# Patient Record
Sex: Male | Born: 1940 | Race: White | Hispanic: No | Marital: Married | Smoking: Former smoker
Health system: Southern US, Community
[De-identification: ages and names within clinical notes are randomized; demographics above are authoritative.]

## PROBLEM LIST (undated history)

## (undated) DIAGNOSIS — F419 Anxiety disorder, unspecified: Secondary | ICD-10-CM

## (undated) DIAGNOSIS — I4891 Unspecified atrial fibrillation: Secondary | ICD-10-CM

## (undated) DIAGNOSIS — F32A Depression, unspecified: Secondary | ICD-10-CM

## (undated) DIAGNOSIS — R32 Unspecified urinary incontinence: Secondary | ICD-10-CM

## (undated) DIAGNOSIS — K5900 Constipation, unspecified: Secondary | ICD-10-CM

## (undated) DIAGNOSIS — K219 Gastro-esophageal reflux disease without esophagitis: Secondary | ICD-10-CM

## (undated) DIAGNOSIS — G8929 Other chronic pain: Secondary | ICD-10-CM

## (undated) DIAGNOSIS — M549 Dorsalgia, unspecified: Secondary | ICD-10-CM

## (undated) DIAGNOSIS — I2510 Atherosclerotic heart disease of native coronary artery without angina pectoris: Secondary | ICD-10-CM

## (undated) DIAGNOSIS — I10 Essential (primary) hypertension: Secondary | ICD-10-CM

## (undated) DIAGNOSIS — J189 Pneumonia, unspecified organism: Secondary | ICD-10-CM

## (undated) DIAGNOSIS — I639 Cerebral infarction, unspecified: Secondary | ICD-10-CM

## (undated) DIAGNOSIS — F329 Major depressive disorder, single episode, unspecified: Secondary | ICD-10-CM

## (undated) DIAGNOSIS — C801 Malignant (primary) neoplasm, unspecified: Secondary | ICD-10-CM

## (undated) DIAGNOSIS — E785 Hyperlipidemia, unspecified: Secondary | ICD-10-CM

---

## 1998-12-05 ENCOUNTER — Emergency Department (HOSPITAL_COMMUNITY): Admission: EM | Admit: 1998-12-05 | Discharge: 1998-12-05 | Payer: Self-pay | Admitting: Emergency Medicine

## 2000-01-07 ENCOUNTER — Encounter: Payer: Self-pay | Admitting: Emergency Medicine

## 2000-01-07 ENCOUNTER — Inpatient Hospital Stay (HOSPITAL_COMMUNITY): Admission: EM | Admit: 2000-01-07 | Discharge: 2000-01-08 | Payer: Self-pay | Admitting: Emergency Medicine

## 2000-01-08 ENCOUNTER — Encounter: Payer: Self-pay | Admitting: Cardiology

## 2002-07-20 ENCOUNTER — Encounter: Admission: RE | Admit: 2002-07-20 | Discharge: 2002-10-18 | Payer: Self-pay | Admitting: Internal Medicine

## 2004-12-19 ENCOUNTER — Ambulatory Visit (HOSPITAL_COMMUNITY): Admission: RE | Admit: 2004-12-19 | Discharge: 2004-12-19 | Payer: Self-pay | Admitting: Urology

## 2005-06-27 ENCOUNTER — Ambulatory Visit: Payer: Self-pay | Admitting: Internal Medicine

## 2005-07-02 ENCOUNTER — Ambulatory Visit: Payer: Self-pay | Admitting: Internal Medicine

## 2005-07-03 ENCOUNTER — Encounter: Payer: Self-pay | Admitting: Internal Medicine

## 2005-07-03 ENCOUNTER — Ambulatory Visit: Payer: Self-pay

## 2005-07-06 ENCOUNTER — Ambulatory Visit: Payer: Self-pay | Admitting: Internal Medicine

## 2005-07-11 ENCOUNTER — Ambulatory Visit: Payer: Self-pay | Admitting: Internal Medicine

## 2005-07-18 ENCOUNTER — Ambulatory Visit: Payer: Self-pay | Admitting: Internal Medicine

## 2005-08-15 ENCOUNTER — Ambulatory Visit: Payer: Self-pay | Admitting: Internal Medicine

## 2005-09-28 ENCOUNTER — Ambulatory Visit: Payer: Self-pay | Admitting: Internal Medicine

## 2005-10-01 ENCOUNTER — Ambulatory Visit: Payer: Self-pay | Admitting: Internal Medicine

## 2005-10-02 ENCOUNTER — Encounter: Payer: Self-pay | Admitting: Internal Medicine

## 2006-10-10 ENCOUNTER — Encounter: Admission: RE | Admit: 2006-10-10 | Discharge: 2006-10-10 | Payer: Self-pay | Admitting: Internal Medicine

## 2006-11-20 ENCOUNTER — Ambulatory Visit: Payer: Self-pay | Admitting: Cardiology

## 2006-11-25 DIAGNOSIS — E785 Hyperlipidemia, unspecified: Secondary | ICD-10-CM

## 2006-11-25 DIAGNOSIS — K219 Gastro-esophageal reflux disease without esophagitis: Secondary | ICD-10-CM

## 2006-11-25 DIAGNOSIS — Z87442 Personal history of urinary calculi: Secondary | ICD-10-CM

## 2006-11-25 DIAGNOSIS — I4891 Unspecified atrial fibrillation: Secondary | ICD-10-CM

## 2006-11-25 DIAGNOSIS — I10 Essential (primary) hypertension: Secondary | ICD-10-CM | POA: Insufficient documentation

## 2006-11-25 HISTORY — DX: Gastro-esophageal reflux disease without esophagitis: K21.9

## 2006-11-25 HISTORY — DX: Hyperlipidemia, unspecified: E78.5

## 2006-11-26 ENCOUNTER — Ambulatory Visit: Payer: Self-pay

## 2006-11-26 LAB — CONVERTED CEMR LAB
ALT: 13 units/L (ref 0–53)
Bilirubin, Direct: 0.1 mg/dL (ref 0.0–0.3)
CRP, High Sensitivity: 1 (ref 0.00–5.00)
Calcium: 8.5 mg/dL (ref 8.4–10.5)
Direct LDL: 137.4 mg/dL
GFR calc Af Amer: 109 mL/min
GFR calc non Af Amer: 90 mL/min
Glucose, Bld: 108 mg/dL — ABNORMAL HIGH (ref 70–99)
HDL: 35 mg/dL — ABNORMAL LOW (ref >39.0)
Potassium: 3.8 meq/L (ref 3.5–5.1)
TSH: 1.04 microintl units/mL (ref 0.35–5.50)
Total CHOL/HDL Ratio: 6.3
Triglycerides: 159 mg/dL — ABNORMAL HIGH (ref 0–149)

## 2006-12-04 ENCOUNTER — Ambulatory Visit: Payer: Self-pay | Admitting: Cardiology

## 2006-12-19 ENCOUNTER — Ambulatory Visit: Payer: Self-pay | Admitting: Internal Medicine

## 2006-12-23 ENCOUNTER — Ambulatory Visit: Payer: Self-pay | Admitting: Cardiology

## 2006-12-26 ENCOUNTER — Ambulatory Visit: Payer: Self-pay | Admitting: Cardiology

## 2007-01-08 ENCOUNTER — Ambulatory Visit: Payer: Self-pay | Admitting: Cardiology

## 2007-02-03 ENCOUNTER — Ambulatory Visit: Payer: Self-pay | Admitting: Cardiology

## 2007-02-03 LAB — CONVERTED CEMR LAB
ALT: 14 units/L (ref 0–53)
AST: 15 units/L (ref 0–37)
Alkaline Phosphatase: 41 units/L (ref 39–117)
BUN: 14 mg/dL (ref 6–23)
Bilirubin, Direct: 0.1 mg/dL (ref 0.0–0.3)
CO2: 30 meq/L (ref 19–32)
Calcium: 8.9 mg/dL (ref 8.4–10.5)
Chloride: 109 meq/L (ref 96–112)
Cholesterol: 146 mg/dL (ref 0–200)
Creatinine, Ser: 1.2 mg/dL (ref 0.4–1.5)
Glucose, Bld: 107 mg/dL — ABNORMAL HIGH (ref 70–99)
Total Bilirubin: 0.8 mg/dL (ref 0.3–1.2)
Total CHOL/HDL Ratio: 3.7
Total Protein: 6.6 g/dL (ref 6.0–8.3)

## 2007-02-06 ENCOUNTER — Ambulatory Visit: Payer: Self-pay | Admitting: Internal Medicine

## 2007-02-06 ENCOUNTER — Inpatient Hospital Stay (HOSPITAL_COMMUNITY): Admission: EM | Admit: 2007-02-06 | Discharge: 2007-02-19 | Payer: Self-pay | Admitting: Emergency Medicine

## 2007-02-06 ENCOUNTER — Ambulatory Visit: Payer: Self-pay | Admitting: Cardiology

## 2007-02-06 ENCOUNTER — Ambulatory Visit: Payer: Self-pay | Admitting: Pulmonary Disease

## 2007-02-06 LAB — CONVERTED CEMR LAB
Basophils Absolute: 0 10*3/uL (ref 0.0–0.1)
Calcium: 9.2 mg/dL (ref 8.4–10.5)
Chloride: 103 meq/L (ref 96–112)
Eosinophils Absolute: 0.1 10*3/uL (ref 0.0–0.6)
Eosinophils Relative: 1.7 % (ref 0.0–5.0)
GFR calc Af Amer: 86 mL/min
GFR calc non Af Amer: 71 mL/min
Glucose, Bld: 79 mg/dL (ref 70–99)
Lymphocytes Relative: 32.9 % (ref 12.0–46.0)
MCHC: 35.8 g/dL (ref 30.0–36.0)
MCV: 91.7 fL (ref 78.0–100.0)
Magnesium: 2.2 mg/dL (ref 1.5–2.5)
Neutro Abs: 4 10*3/uL (ref 1.4–7.7)
Platelets: 236 10*3/uL (ref 150–400)
RBC: 4.17 M/uL — ABNORMAL LOW (ref 4.22–5.81)
Sodium: 140 meq/L (ref 135–145)

## 2007-02-10 ENCOUNTER — Encounter (INDEPENDENT_AMBULATORY_CARE_PROVIDER_SITE_OTHER): Payer: Self-pay | Admitting: Neurology

## 2007-02-11 ENCOUNTER — Encounter (INDEPENDENT_AMBULATORY_CARE_PROVIDER_SITE_OTHER): Payer: Self-pay | Admitting: Neurology

## 2007-02-14 ENCOUNTER — Ambulatory Visit: Payer: Self-pay | Admitting: Physical Medicine & Rehabilitation

## 2007-02-19 ENCOUNTER — Inpatient Hospital Stay (HOSPITAL_COMMUNITY)
Admission: RE | Admit: 2007-02-19 | Discharge: 2007-03-25 | Payer: Self-pay | Admitting: Physical Medicine & Rehabilitation

## 2007-04-04 ENCOUNTER — Ambulatory Visit: Payer: Self-pay | Admitting: Vascular Surgery

## 2007-04-04 ENCOUNTER — Ambulatory Visit: Admission: RE | Admit: 2007-04-04 | Discharge: 2007-04-04 | Payer: Self-pay | Admitting: Internal Medicine

## 2007-04-04 ENCOUNTER — Encounter (INDEPENDENT_AMBULATORY_CARE_PROVIDER_SITE_OTHER): Payer: Self-pay | Admitting: Internal Medicine

## 2007-12-18 ENCOUNTER — Ambulatory Visit: Payer: Self-pay | Admitting: Cardiology

## 2008-07-12 IMAGING — CR DG ABD PORTABLE 1V
1 series · 1 of 1 positions shown · non-contrast
Comparison: None.

CLINICAL DATA: 66 year old; cerebellar hemorrhage.  Panda tube placement.
 PORTABLE ABDOMEN - 1 VIEW - 02/14/07:

[view not recorded]
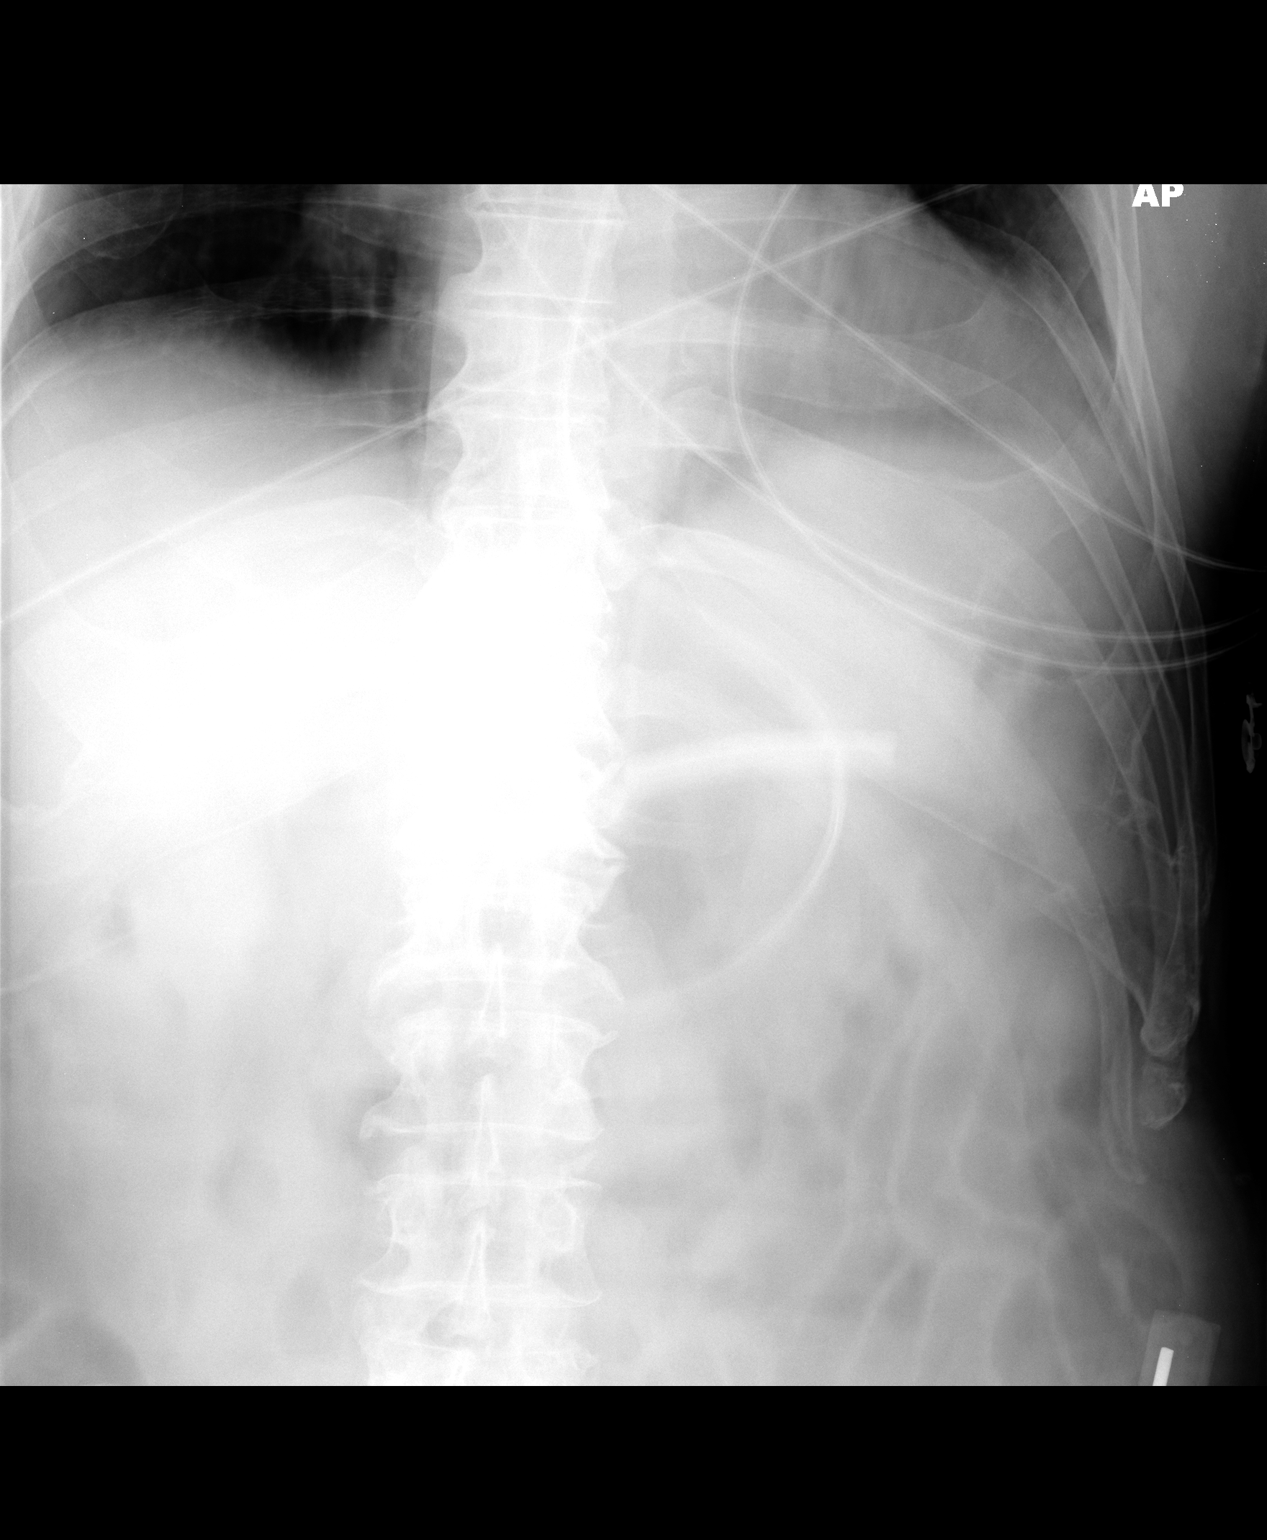

[1 of 1 positions shown; findings below may reference images not displayed]

FINDINGS: The Panda tube is coiled back on itself with the tip in the body region of the stomach.
IMPRESSION: Panda tube tip coiled back with its tip in the body region of the stomach.

## 2010-06-18 ENCOUNTER — Encounter: Payer: Self-pay | Admitting: Physical Medicine & Rehabilitation

## 2010-06-18 ENCOUNTER — Encounter: Payer: Self-pay | Admitting: Internal Medicine

## 2010-06-19 ENCOUNTER — Encounter: Payer: Self-pay | Admitting: Neurology

## 2010-10-10 NOTE — Discharge Summary (Signed)
NAME:  Gary Reynolds, Gary Reynolds                 ACCOUNT NO.:  [id number]   MEDICAL RECORD NO.:  [id number]          PATIENT TYPE:  INP   LOCATION:  3031                         FACILITY:  [facility]   PHYSICIAN:  Pramod P. Pearlean Brownie, MD    DATE OF BIRTH:  [birth year]   DATE OF ADMISSION:  02/06/2007  DATE OF DISCHARGE:  02/19/2007                               DISCHARGE SUMMARY   DIAGNOSES AT TIME OF DISCHARGE.:  1. Right thalamic intracranial hemorrhage, secondary to Coumadin;      status post intraventricular catheter placement.  2. Aphasia.  3. Hyperlipidemia.  4. Hypertension.  5. Atrial fibrillation, for which she was on chronic Coumadin prior to      admission.  6. Obesity.  7. Sedentary lifestyle.  8. Questionable situational depression.   MEDICINES AT TIME OF DISCHARGE:  1. Sliding-scale insulin q.4 h.  2. Protonix 40 mg a day.  3. Avapro 150 mg a day, which is a substitution for Diovan 80.  4. Hydrochlorothiazide 12.5 mg a day.  5. Lovenox 40 mg subcutaneously daily.  6. Zocor 40 mg a day.  7. Colace 100 mg b.i.d.  8. Jevity 75 mL an hour via Panda.  9. Free water 90 mL q.4 h.   STUDIES PERFORMED:  1. CT of the brain on admission showed acute right thalamic bleed;      roughly 3 cubic cm.  2. CT of the brain at 24 hours shows stable intracranial hemorrhage.  3. CT of the brain at 36 hours shows right thalamic bleed as stable,      with volume increase to 12 mL.  4. CT of the brain at 48 hours shows interval placement of new      ventriculostomy chest drainage, with the hemorrhage 4.1 x 3.5.  5. Follow-up CT on February 11, 2007 shows stable right      intraparenchymal hemorrhage, with mild bilateral intraventricular      hemorrhage.  6. CT of the brain on February 13, 2007 shows gas in the left frontal      subdural space following ventriculostomy removal; no hydrocephalus.      Stable right thalamic and basal ganglia intraparenchymal hemorrhage      and intraventricular  hemorrhage.  7. Chest X-Ray: With increasing basilar opacities, which remained      stable then improved throughout hospitalization.  Last chest x-ray      performed February 14, 2007 with a slight increase in pulmonary      edema.  8. Abdominal x-ray verifying Panda tube placement.  9. Carotid Doppler shows no ICA stenosis.  Vertebral flow antegrade      bilaterally.  10.EKG shows atrial fibrillation with old septal infarct.   LABORATORY STUDIES:  CBC with hemoglobin 12.1, hematocrit 34.8 white  blood cells 10.7.  Chemistry:  BUN 35, creatinine 0.88, glucose 115.  Blood cultures negative x2 days.  INR on admission 2.4 and down to 1.3  on February 09, 2007.  Hemoglobin A1c 5.6.  Homocystine 11.0.  Cardiac  enzymes negative.  Cholesterol 101, triglycerides 84, HDL 34, LDL 50.  Respiratory  culture with abundant diphtheroids.   HISTORY OF PRESENT ILLNESS:  [name] is a 70 year old Caucasian male  with known atherosclerotic cardiovascular disease and atrial  fibrillation on Coumadin; who had multiple nonhemorrhagic infarcts  bilaterally.  The patient was to undergo cardioversion February 07, 2007.  On September 11 he had coitus with his wife after taking Levitra.  Shortly thereafter as he was dressing he developed weakness on his left  side that proceeded to flaccid strength, left facial droop and slurred  speech.  He was brought to [facility] and as he arrived it was clear  that he was improving.  His strength improved to the point of mild left-  sided weakness in the range of 4/5, and his NIH stroke scale at 8:50  p.m. was 4.   CT of the brain showed a 23 x 19 mm lesion in the right thalamus without  mass effect.  The patient suddenly then became diaphoretic, had some  problems with breathing and coughing, and developed increasing weakness  on the left side.  He was brought to the CT scan, and the lesion had  markedly increased in the range of an irregularly contoured 43 by 43  x  50 mm lesion -- again, involving the thalamus with hemorrhage into the  right lateral ventricle.  The quadrigeminal cistern was opened.   The increase in hemorrhage happened despite the fact the patient  received 5 mg of vitamin K and 2 units of fresh frozen plasma.  The  plasma was going at the time he worsened.  His blood pressure was never  greater than 140 systolic and his dialysis was as high as 101; but 91 at  the time of the rebleed.  He was admitted to the ICU for monitoring.  He  was placed on a Cardene drip and was intubated in order to protect his  airway.   HOSPITAL COURSE:  The patient remained intubated on Cardene drip in the  ICU.  Dr. Hilda Lias, neurosurgeon, was consulted for  ventriculostomy placement.  Poor neurologic prognosis was discussed with  the patient's wife and family.  As they had a family member who had a  remarkable recovery following a brain injury, they were interested in  aggressive care.  Critical Care Medicine managed the ventilator during  his ICU stay.  Tube feedings were started.  Cardiology was consulted for  atrial fibrillation.  Aggressive control of blood pressure and  temperature were maintained.  It was felt the source of his hemorrhage  was the fact that he was taking Coumadin for atrial fibrillation.   On September 16 the patient was extubated.  On September 19 the patient  was transferred to the floor; there PT, OT and rehab were consulted and  the patient was felt to be a good rehabilitation candidate.  Though,  neurologic prognosis still remains poor with expected ongoing impairment  with severe neglect.  Modified barium swallow has been attempted  multiple times, and was then able to be performed either secondary to  the patient's sedation or scheduling difficulties.  On September 24 the  patient was stable for discharge to rehabilitation, and was transferred  there for continuation of therapies.  Modified barium swallow is  still  pending.  There is a great potential that the patient will likely need a  PEG placement.  This will be addressed on rehabilitation.  In the  meanwhile, the patient is off antiplatelets secondary to hemorrhage.   CONDITION ON DISCHARGE:  The patient alert and oriented x3.  He has no  aphasia or dysarthria.  He does follow commands.  He has a significant  neglect syndrome with a right gaze preference.  He still has left facial  weakness.  He has dense left hemiparesis.  He has good strength and good  movement on the right.   DISCHARGE/PLAN:  1. Transfer to rehabilitation for continuation of PT, OT and speech      therapy.  2. Modified barium swallow on day of discharge.  Consider PEG as      needed.  3. Off antiplatelets, secondary to hemorrhage.  4. Needs aggressive ongoing risk factor control after discharge from      rehabilitation by primary care physician.  5. Follow up with Dr. Janalyn Shy P. Sethi in 2-3 months.      Annie Main, N.P.    ______________________________  Sunny Schlein. Pearlean Brownie, MD    SB/MEDQ  D:  02/19/2007  T:  02/20/2007  Job:  [id number]   cc:   Thora Lance, M.D.  Thomas C. Daleen Squibb, MD, [facility]  Hilda Lias, M.D.

## 2010-10-10 NOTE — Consult Note (Signed)
Gary Reynolds, Gary Reynolds                 ACCOUNT NO.:  [id number]   MEDICAL RECORD NO.:  [id number]          PATIENT TYPE:  INP   LOCATION:  2308                         FACILITY:  [facility]   PHYSICIAN:  Hilda Lias, M.D.   DATE OF BIRTH:  1940/11/06   DATE OF CONSULTATION:  DATE OF DISCHARGE:                                 CONSULTATION   [name] is a gentleman who was admitted to the hospital yesterday  because of sudden onset of weakness in a patient who had been taking  Coumadin.  Repeated CT scan today showed increase of the right thalamic  hematoma with shift.  The INR this morning was 1.4.  Clinically, the  patient is intubated, he is sedated.  CT scan shows indeed he has right  thalamic hematoma, with shift from right to left.  We were called by the  stroke center, Dr. Pearlean Brownie, for the possibility of interventricular  catheter.  After I looked at the patient, I talked to the wife, and she  and her son fully agreed with the interventricular catheter.  The risks  were explained to both of them including infections, CSF leak, increased  hematoma along the track.           ______________________________  Hilda Lias, M.D.     EB/MEDQ  D:  02/07/2007  T:  02/07/2007  Job:  [id number]

## 2010-10-10 NOTE — Assessment & Plan Note (Signed)
[facility]                            CARDIOLOGY OFFICE NOTE   RODDY, BELLAMY                          MRN:          [id number]  DATE:12/23/2006                            DOB:          13-Sep-1940    Gary Reynolds comes in today to discuss the management of the following issues:  1. Atrial fibrillation.  2. Hypertension.  He brings numerous readings in today which shows      blood pressure to be fairly labile.  Most of them, however, in the      130/80 range.  3. Anticoagulation.  He is having this checked on a weekly basis.  His      last one was therapeutic.  Once he is therapeutic for 3-4 weeks, we      are talking about outpatient cardioversion.  4. Hyperlipidemia, on Crestor 10 mg a day.  This may becoming a      financial issue for him.  We renewed his prescription today with      followup blood work in about 3-4 weeks.   He is under a lot of personal stress.  We discussed these issues at  length today.   His blood pressure today is 135/76, pulse is 80 and irregular.  His  weight is 231.  On his scales, he is actually 220 some, usually runs  about 225, but today he was about 221.  The rest of his exam is  unchanged.  His pulse is irregular.   ASSESSMENT AND PLAN:  I spent about 30 minutes talking to Gary Reynolds today  about all of his medical issues.  Gary Reynolds has also spent a  substantial amount of time with him as well.   PLAN:  1. Renew Crestor.  2. Followup blood work in about 3-4 weeks.  3. Once he is therapeutic with his Coumadin for 3-4 weeks, we will      arrange outpatient cardioversion.     Thomas C. Daleen Squibb, MD, [facility]  Electronically Signed    TCW/MedQ  DD: 12/23/2006  DT: 12/24/2006  Job #: [id number]

## 2010-10-10 NOTE — Discharge Summary (Signed)
Gary Reynolds, Gary Reynolds                 ACCOUNT NO.:  [id number]   MEDICAL RECORD NO.:  [id number]          PATIENT TYPE:  IPS   LOCATION:  4025                         FACILITY:  [facility]   PHYSICIAN:  Ranelle Oyster, M.D.DATE OF BIRTH:  [birth year]   DATE OF ADMISSION:  02/19/2007  DATE OF DISCHARGE:  03/25/2007                               DISCHARGE SUMMARY   DISCHARGE DIAGNOSES:  1. Right thalamic and basal ganglia bleed with left hemiparesis, left      neglect and cognitive deficits.  2. Hypertension.  3. Urinary tract infection, treated.  4. Sacral decubitus.  5. Atrial fibrillation.  6. Anorexia.   HISTORY OF PRESENT ILLNESS:  Gary Reynolds is a 70 year old male with  history of coronary artery disease, chronic atrial fibrillation, with  Coumadin, admitted September 11 with left-sided weakness, left facial  droop and slurred speech.  CT of head showed acute right thalamic bleed.  The patient was treated with fresh frozen plasma and vitamin K, but  noted to have increase in size of bleed, requiring ventriculostomy  placement on September 12 by Dr. Jeral Fruit.  The patient was sedated and  intubated to protect airway and extubated without difficulty on  September 16.  Followup CCT on September 18 shows stable right thalamic  and right basal ganglia hemorrhage with mild right-to-left shift  unchanged.  Cardiac echo done showed EF of 55%, mild to moderate  increase in left ventricular wall.  Carotid Doppler showed no ICA  stenosis.  The patient was noted to have issues with fevers, blood  cultures and respiratory cultures negative.  The patient was initially  covered with IV antibiotics.  Currently, the patient remains n.p.o. with  MBS pending.  He was noted to continue with left gaze preference with  left hemiparesis and 3 deficits on the left, lethargy improving, noted  be hyper-verbose.  Therapies were initiated and the patient was noted to  have impairments in balance, able to sit at  the edge of the bed with min  to max assist, tendency to fall to left.  Renal insufficiency was noted  with BUN and 35, creatinine at 0.88.  Rehab was consulted for further  therapies.   PAST MEDICAL HISTORY:  Significant for:  1. Hypertension.  2. Dyslipidemia.  3. PAF.  4. Colon polyps.  5. Pterygium , left eye.   ALLERGIES:  SULFA , BEE STINGS and SHELLFISH.   FAMILY HISTORY:  Positive for ALS.   SOCIAL HISTORY:  The patient is married, lives in a 1-level home with 4  steps at entry and was independent, but sedentary prior to admission.  Tobacco:  He was smoking 2 packs per day till a few months ago.  He  drinks 2-4 ounces of alcohol per day.   HOSPITAL COURSE:  Gary Reynolds was admitted to Rehab on February 19, 2007 for inpatient therapies to consist of PT, OT and speech therapy.  The patient had MBS done on day of admission on September 24 and was  initiated on D-I diet and nectar-thick liquids.  Initially, patient was  on h.s.  tube feeds; this was discontinued on September 26 an IV fluids  were started for hydration from 7 p.m. to 7 a.m. secondary to nectar-  thick liquids.  He was started also on Megace 400 mg b.i.d. to help with  appetite stimulation.  The patient's blood pressures have been monitored  on a b.i.d. basis and initially were noted to be elevated; Avapro was  increased.  However, the patient was noted to have a downward trend on  blood pressure; therefore, BP medications have slowly been discontinued.  The patient was noted to have spasticity on left and trial of baclofen  was done, but patient with some lethargy on this; therefore, this was  discontinued.  Patient with acute renal insufficiency at admission.  This has slowly been improving.  Repeat swallow studies were done during  this stay and at last study of October 17, advanced the patient to D-II  diet with thin liquids.  He continues to require full supervision,  crushed medications in puree and  small bites and sips.  The patient  continues with Foley secondary to neurogenic bladder problems with  urinary retention.  He was noted to develop an eversion on the penile  meatus that has been treated with bacitracin ointment b.i.d. as well as  lidocaine q.i.d. secondary to pain issues.  Attempt was made to secure  Foley tubing with the leg bag; however, this placed extra traction on  the penile meatus; therefore, this was discontinued.  Recommendations  are to continue close monitoring of this site.  Wound bed currently is  noted to have clean red granulation tissue.  Continue routine care of  meatus as well as laxity on the Foley tubing to prevent further erosion.   The patient was noted to develop a sacral decubitus at coccyx as well as  on left buttock.  EPC cream was used initially.  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  Recommendations are also  to use air mattress overlay as well as turn patient q.2 h. when in bed.  Also note that the patient is unable to self boost; he will require  boosting when in wheelchair and recommend using nice thick gel cushion  to prevent further breakdown of sacral and buttock areas.  Currently,  patient with some continuous issues with hypotension while standing.  Abdominal binder is used to help support blood pressure.  Most recent  check of labs from October 27 shows renal status to be stable with  sodium 135, potassium 4.1, chloride 105, CO2 23, BUN 19, creatinine 1.5,  glucose 104.  Check of CBC reveals hemoglobin 12.4, hematocrit 35, white  count 13.2, platelet 311,000.  Of note, the patient was noted to have  fevers on October 15 with urine culture growing out greater than 100,000  colonies of Pseudomonas.  Initially, he was started on Keflex and as  Pseudomonas was resistant to Keflex, he was changed over to Levaquin for  6 total days of antibiotic  treatment.  Repeat urine culture from October  22 shows no growth.  Last prealbumin level is at 26.2.  Last weight of  October 22 is at 94 kg.  Recommendations are to continue encouraging the  patient's p.o., assist patient at meals as needed to help with p.o.  intake.  Continue nutritional supplements, Ensure with fiber t.i.d. to  help maintain nutritional status.   The patient  was started with a trial of Reglan; however, he has had  decrease in verbal output; family are concerned regarding medication  side-effects.  Reglan was discontinued and antidepressant was initiated  for a few days; however, this was deceased also to decrease any effects  from medication side effects.  The patient was started on Provigil 200  mg per day on October 24 to help with attention as well as arousal.  Therapies continue to be ongoing with the patient being at +2 total  assist, 20% for bed mobility, standing activity attempted and Theraplus  with reaching with right upper extremity supported in sitting and  standing.  Patient was noted to be distracted secondary to left lower  extremity flexure withdrawal due to spasticity.  He continues to have  problems with posture with tendency to push to the left in sitting.  BP  in standing last with the PT shows it is at 141/92 with the patient  working on weight shifting to the right in extending bilateral knees  with max assist.  OT has been working with standing activities  Theraplus.  He is noted to have some and increase level of anxiety in  standing and weight shifting.  OT is also working on weightbearing to  left upper extremity and in reaching activities.  Speech has been  working with the patient on aspiration precautions.  Currently, the  patient is able to mirror at modified independent level for clearing  left residue.  He is max assist for his meal intake.  He requires mod to  max cues with tactile stimulation to cleanse left face and oral cavity,   max assist to elicit response when confronted, max assist at abstract  humor-solving riddles, max assist to sustain attention to tasks for 1-2  minutes.  Currently, the patient's condition continues to require total  assist for self-care mobility as well as issues with cognitive deficits.  He will continue to require progressive PT, OT and speech therapy and  family has elected on SNF.  Bed is available on October 28 and the  patient is to be discharged to this facility.   DISCHARGE MEDICATIONS:  1. Senokot-S two p.o. nightly.  2. Dulcolax suppository one per rectum every other day.  3. Protonix 40 mg p.o. per day.  4. Megace 400 mg p.o. b.i.d.  5. Zocor 40 mg p.o. per day.  6. Bacitracin ointment to penile ulcer b.i.d.  7. Lidocaine jelly t.i.d. and p.r.n. to penile ulcer during the day.  8. Provigil 200 mg p.o. q.a.m.  9. Colace 100 mg p.o. b.i.d.  10.Ensure vanilla pudding t.i.d.  11.Resource supplements t.i.d.   DIET:  D-II thin liquids with full supervision for aspiration  precautions; see list of precautions.  Crush medications and give in  puree.  The patient needs to be seated upright.   ACTIVITY:  Twenty-four-hour supervision assistance with routine pressure  relief measures when in chair, boost every 30 minutes when in chair;  please use nice gel cushion for pressure relief measures.  Air mattress  overlay when in bed.  Progressive PT, OT and  speech therapy to continue  past discharge.   FOLLOWUP:  Patient to follow up with LMD for medical issues in the next  7-14 days with routine monitoring of electrolytes as well as routine  check of sacral wounds as well as penile erosion.  Follow up with Dr.  Riley Kill in 4-6 weeks.  Follow up with Cardiology for arrhythmia or  cardiac issues.  Follow up with Dr. Jeral Fruit for routine check in 4 weeks.      Greg Cutter, P.A.      Ranelle Oyster, M.D.  Electronically Signed    PP/MEDQ  D:  03/24/2007  T:  03/25/2007   Job:  [id number]   cc:   Dr. Sharlee Blew C. Daleen Squibb, MD, [facility]  Deanna Artis. Sharene Skeans, M.D.

## 2010-10-10 NOTE — Assessment & Plan Note (Signed)
[facility]                            CARDIOLOGY OFFICE NOTE   ASANTE, BLANDA                          MRN:          [id number]  DATE:12/18/2007                            DOB:          01-Apr-1941    Gary Reynolds comes to the office today with his wife and son from [facility].   He had an episode of chest discomfort on December 03, 2007, that he describes  as a chest tightness going up into his shoulders.  He does not recall  all the details, but he did not have any nausea, vomiting, or  diaphoresis.  His friends at the center said that he felt so bad that he  did not eat any lunch.  When he reported it, he was given a sublingual  nitroglycerin.  He said it relieved the pain.   He has had no episodes since then and has not really had a lot of this  in the past.  They checked cardiac markers, which were negative.  In  addition, they checked an EKG, which I have a very rough copy which  shows no acute changes.   Of note, he had a negative stress Myoview on November 26, 2006, which showed  him exercising for 5-1/2 minutes.  Peak heart rate was 148 beats per  minuets which is 95% a predicted maximum heart rate.  He had an EF of  58% with no sinus, scar, or ischemia.   He has lost a tremendous amount of weight since he had an intracranial  hemorrhage.  His weight is probably dropped over 30 pounds.  His blood  pressure is under excellent control, and he remains on statin as well as  Coumadin.   MEDICATIONS:  His meds are,  1. Diovan HCT 80/12.5 q.a.m.  2. Coumadin as directed.  3. Zocor 40 mg a day.  4. Colace 100 mg p.o. b.i.d.  5. Multivitamin.  6. Concerta 36 mg a day.  7. Omeprazole 20 mg Monday, Wednesday, Friday, and Saturday.  8. Atenolol 25 mg a day.  9. Zyprexa 2.5 mg nightly.  10.Dulcolax.   PHYSICAL EXAMINATION:  He is sitting in a wheelchair.  He is in no acute  distress.  He is fairly articulate though a little bit methodical.   He  is very entertaining and conversant.  His wife and his son are present.  VITAL SIGNS:  His blood pressure 118/75 and his pulse 77 and regular.  He is in a wheelchair.  We did not weigh him today.  HEENT:  He has some  residual facial droop on the left.  His speech is not slurred, and he is  articulate.  It is slow.  NECK:  Shows no JVD.  Carotid upstrokes were equal bilaterally without  bruits.  Thyroid is not enlarged.  LUNGS:  Clear anteriorly.  HEART:  Reveals a soft S1 and S2.  No gallop.  ABDOMEN:  Soft.  Good bowel sounds.  EXTREMITIES:  He has a brace and support for his arm and leg on the left  side.  His  right shows some generalized atrophy.  Good pulses are  present distally.  There is no significant swelling.  There is no sign  of DVT.   I suspect that Gary Reynolds had an episode of either gastroesophageal reflux  and spasm or maybe some angina.  The fact that it responded to  nitroglycerin really does not help clarify this as I explained to he and  his family.  He is on excellent secondary prevention program and has had  a low-risk stress Myoview in the last year.   At this point, I think we will continue with medical therapy.  If he  begins to have recurrent symptoms response to nitroglycerin, we may have  to do a diagnostic test such as repeat nuclear study.  I would rather  not proceed at this point unless this is the case.  I explained this to  he and his family, and they understand and agreed.  I will plan on  seeing him back again in a couple of months.     Thomas C. Daleen Squibb, MD, [facility]  Electronically Signed    TCW/MedQ  DD: 12/18/2007  DT: 12/19/2007  Job #: [id number]

## 2010-10-10 NOTE — Assessment & Plan Note (Signed)
[facility]                            CARDIOLOGY OFFICE NOTE   KARRON, ALVIZO                          MRN:          [id number]  DATE:02/06/2007                            DOB:          06-Nov-1940    CARDIOLOGY OFFICE/OUTPATIENT CARDIOVERSION NOTE:  Elnita Maxwell comes in today  to be followed up for his atrial fib, anticoagulation, hypertension,  hyperlipidemia and to be set up for an outpatient cardioversion.   He has been having significant problems with insomnia.  He is convinced  it is the Coumadin.  I told him if it was any drug, it was most likely  Crestor.   PROBLEM LIST:  1. Paroxysmal atrial fibrillation.  2. Hypertension.  3. Anticoagulation.  He has been therapeutic now in July, August, and      September 8th being his last check at 2.3.  4. Hyperlipidemia.  His lipids are at goal except for a low HDL on      Crestor 10 mg.   He has had a negative stress Myoview on November 26, 2006.  His 2D  echocardiogram shows normal left ventricular function, mild left atrial  enlargement, moderate focal basal septal hypertrophy without any  evidence of outflow tract obstruction.  His left atrial size was 36-mm.  This was read by Dr. Nicholes Mango.   He is very apprehensive about being put to sleep and having his heart  shocked.  I told him this was the first step.  I have also told him  there is about a 75% chance he will convert and he will hold but may  revert back to atrial fib.  This is a temporary fix as I have also told  him today.   CURRENT MEDICATIONS:  1. Diovan/HCTZ 80/12.5 q.a.m.  2. Coumadin as directed.  3. Crestor 10 mg a day.  4. Fish oil.  5. Prilosec over-the-counter.   PHYSICAL EXAMINATION:  VITAL SIGNS:  His blood pressure today is 112/70.  His pulse is 78 and regular.  His weight is 237.  HEENT:  Normocephalic atraumatic.  PERRLA.  Extraocular movements  intact.  Sclerae are slightly injected.  PERRLA.  Facial symmetry is  normal.  NECK:  Carotids upstrokes are equal bilaterally without bruits.  No JVD.  Thyroid is not enlarged.  Trachea is midline.  LUNGS:  Clear.  HEART:  Reveals an irregular rate and rhythm.  No gallop.  No murmur.  ABDOMEN:  Soft.  Good bowel sounds.  No midline bruit.  EXTREMITIES:  Reveal no cyanosis, clubbing, or edema.  Pulses are  intact.  NEUROLOGIC:  Intact.   ASSESSMENT/PLAN:  1. Mr. Wiltgen remains in atrial fibrillation with well controlled      ventricular rate.  He is relatively asymptomatic except for some      dyspnea on exertion.  I think it is clearly worthwhile trying to      get him back in a normal sinus rhythm.  I have told him he will      have to remain on Coumadin regardless.  2. His insomnia if due to  medications is likely Crestor.  I have      offered him to stop it for several weeks to see if it improves.   I have arranged him for an outpatient cardioversion since he wants it  done right away with Dr. Nicholes Mango tomorrow at [facility].  Indications, risks, potential benefits have been discussed.  He agrees  to proceed.   I will plan on seeing him back several weeks after the cardioversion.     Thomas C. Daleen Squibb, MD, [facility]  Electronically Signed    TCW/MedQ  DD: 02/06/2007  DT: 02/06/2007  Job #: [id number]

## 2010-10-10 NOTE — Assessment & Plan Note (Signed)
[facility]                            CARDIOLOGY OFFICE NOTE   [name], [name]                          MRN:          [id number]  DATE:11/20/2006                            DOB:          02-14-1941    I was asked by Dr. Kirby Funk to evaluate [facility] for history of  atrial fibrillation, anticoagulation, hypertension.   HISTORY OF PRESENT ILLNESS:  Gary Reynolds is a 70 year old married white male,  social acquaintance of mine, who is referred today for the above  problem.   According to the records and to his history, he was found to be in  atrial fibrillation in February 2007.  He had a 2D echocardiogram which  showed EF 55 to 65%, mild LVH, moderate to severe focal basal septal  hypertrophy without outflow tract obstruction, and a normal left atrial  size.  His right-sided function was also normal.  There was no important  valvular abnormality.   He has had a history of hypertension for a number of years.   He has also had a history of overindulgence which he rarely admits to.  He has been a heavy drinker in the past, but has cut back on this  dramatically.  He has lost 50 pounds in the last year and a half.   He also has a history of hyperlipidemia.  His last lipids that I have a  copy of showed a total cholesterol of 241, triglycerides 186, LDL 155,  HDL 49.  This was in April 2008.  His blood sugar was also borderline  high at 107.  A sed rate was 5.  CBC showed a high hemoglobin of 17.  His thyroid parameters were normal.  LFTs were normal.  Potassium was  4.6.   He also used to smoke heavily, but has really quit over the last several  months.   He was placed on Coumadin by Dr. Cato Mulligan last year.  Then somehow he got  off of it.  Dr. Kirby Funk has reinitiated this about a month ago.  He  is having frequent protimes because of difficulty regulating it.   ALLERGIES:  Intolerant of SULFA.  He has a history of intolerance to BEE  STINGS, SHELLFISH.   CURRENT MEDICATIONS:  1. Prilosec 20 mg daily.  2. Coumadin which is being regulated and changed by Dr. Valentina Lucks.  3. Levitra p.r.n.  4. Diovan HCT 80/12.5 daily.  5. Fish oil unknown dose daily.   He exercises very little.  He quit smoking at the end of last year.  He  says he has 1-2 alcoholic beverages a day.   PAST SURGICAL HISTORY:  He has had a cyst removal in the past.   FAMILY HISTORY:  Noncontributory.   SOCIAL HISTORY:  His dad had ALS and died of congestive heart failure,  and apparently had a cardiac arrest at age 15.   He is retired.  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   He is married.  Has five children.   REVIEW OF SYSTEMS:  Negative other than the HPI.  He denies any melena,  hematochezia.  He does have a history of gastroesophageal reflux and  esophageal stricture diagnosed by Dr. Sheryn Bison by endoscopy in  the past.  He has some diverticula.   PHYSICAL EXAMINATION:  VITAL SIGNS:  He is 6 feet 3 inches, weighs 225  pounds, blood pressure 129/91, pulse 79 and irregular.  An EKG shows  atrial fibrillation with no specific ST segment changes.  HEENT:  Ruddy complexion.  Sclerae slightly injected.  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  PERRLA, extraocular movements intact.  Facial symmetry is normal.  Dentition satisfactory.  NECK:  Supple, carotid upstrokes are equal bilaterally without bruits.  No JVD, thyroid is not enlarged.  Trachea is midline.  LUNGS:  Relatively clear with decreased breath sounds throughout.  HEART:  Reveals nondisplaced PMI.  There is normal S1, S2.  It is  variable with his rate and rhythm.  There is no murmur, rub or gallop.  ABDOMEN:  Soft with good bowel sounds.  He has no obvious organomegaly  including hepatomegaly.  EXTREMITIES:  No cyanosis, clubbing or edema.  Pulses are brisk.  NEUROLOGICAL:   Intact.  SKIN:  Tanned.   ASSESSMENT:  1. Atrial fibrillation which may or may not be chronic having been      diagnosed initially in February 2007.  Predisposing factors include      increasing age, hypertension, excess alcohol.  His rate control is      good at least at rest without AV nodal blocking drugs.  He is      relatively asymptomatic.  2. Anticoagulation which is being reinitiated and managed by Dr. Kirby Funk.  Therapeutic goal would be 2-3.  3. Hypertension.  4. Tobacco use which he has quit.  5. History of significant alcohol use which he has cut back on      dramatically.  6. History of obesity which is largely resolved with about a 50 pound      weight loss which is voluntary.  7. Mixed hyperlipidemia.   RECOMMENDATIONS:  1. With his multiple cardiac risk factors I have recommended exercise      rest stress Myoview.  May need to rule out obstructive coronary      disease.  We also need to make sure he has ventricular rate control      with exertion.  2. Fasting lipids with his additional weight loss.  He has been on      Lipitor in the past, and I strongly will recommend a statin      depending on his values.  3. Continue Coumadin with therapeutic goal of 2-3.  4. Consider DC cardioversion once Coumadin therapeutic for at least      four weeks or more.  With the left atrial size not being enlarged      on his 2D echocardiogram, he may be a good candidate for DC      cardioversion.  It is difficult wanting to do this because he is      asymptomatic.  5. Therapy lifestyle changes.  I spent about 20 minutes talking to him      about this.  I think he has already bought into it.   I will plan on seeing him back in about 3-4 weeks.  If his stress test  is abnormal, he will need a  heart catheterization.     Gary Reynolds Squibb, MD, [facility]  Electronically Signed    TCW/MedQ  DD: 11/20/2006  DT: 11/21/2006  Job #: [id number]   cc:   Thora Lance, M.D.

## 2010-10-10 NOTE — Op Note (Signed)
NAMESYRE, KNERR                 ACCOUNT NO.:  [id number]   MEDICAL RECORD NO.:  [id number]          PATIENT TYPE:  INP   LOCATION:  2308                         FACILITY:  [facility]   PHYSICIAN:  Hilda Lias, M.D.   DATE OF BIRTH:  04-23-1941   DATE OF PROCEDURE:  02/07/2007  DATE OF DISCHARGE:                               OPERATIVE REPORT   PREOPERATIVE DIAGNOSIS:  Right thalamic hematoma.   POSTOPERATIVE DIAGNOSIS:  Right thalamic hematoma.   PROCEDURE:  Insertion of a left intraventricular catheter for  cerebrospinal fluid drain and control of intracranial pressure.   ANESTHESIA:  Local plus IV sedation.   SURGEON:  Hilda Lias, M.D.   CLINICAL HISTORY:  The patient was admitted to the hospital yesterday  with a stroke.  This morning, x-rays showed that indeed the hematoma and  the right thalamus has increased in size with shift from right to left.  Patient had been seen by the stroke center and we were called for a  intraventricular catheter.  I spoke with the neurologist with the wife  and the son.  The procedure was fully explained to both of them,  including the risks such as hematoma, CSF leak, infection.   PROCEDURE:  The left side of the head was shaved and prepped with  Betadine.  A landmark 2 cm away from the midline and 2 cm in front of  the coronal suture was done.  Infiltration was done with Xylocaine and  with a knife we opened the scalp.  We drilled the skull until we were  able to feel the dura mater.  The dura mater was opened with an 18-gauge  needle.  Then an intraventricular catheter was inserted into the left  ventricle.  The CSF came out to be blood in the beginning, and later on  pink with increased pressure.  A tunnel was developed to bring the  catheters through a separate incision and the catheter was secured in  place with nylon.  The wound was closed with nylon.   He will continue with the treatments per the stroke center.  We are  going to  drain about 15 cm of water.           ______________________________  Hilda Lias, M.D.     EB/MEDQ  D:  02/07/2007  T:  02/07/2007  Job:  [id number]

## 2010-10-10 NOTE — H&P (Signed)
Gary Reynolds, Gary Reynolds                 ACCOUNT NO.:  [id number]   MEDICAL RECORD NO.:  [id number]          PATIENT TYPE:  INP   LOCATION:  2308                         FACILITY:  [facility]   PHYSICIAN:  Deanna Artis. Hickling, M.D.DATE OF BIRTH:  01-10-1941   DATE OF ADMISSION:  02/06/2007  DATE OF DISCHARGE:                              HISTORY & PHYSICAL   CHIEF COMPLAINT:  Left-sided weakness.   HISTORY OF THE PRESENT CONDITION:  The patient is a 70 year old  gentleman with known atherosclerotic cardiovascular disease, atrial  fibrillation on Coumadin, who has had multiple nonhemorrhagic  infarctions bilaterally.  The patient was to undergo cardioversion  tomorrow, September 12.  Tonight he had coitus with his wife after  taking Levitra.  Shortly thereafter as he was dressing, he developed  weakness on his left side that proceeded to flaccid strength and left  facial droop and slurred speech.  He was brought to [facility]  and as he arrived, it was clear that he was much stronger.  Indeed, his  strength improved to the point of mild left-sided weakness in the range  of 4/5 and his NIH stroke scale at 2050 was 4.   CT scan of the brain showed evidence of a 23 x 19-mm lesion in the right  thalamus without mass effect.  The patient suddenly became diaphoretic,  had some problems with breathing and coughing, and developed increasing  weakness on the left side.  He was brought back to CT scan and the  lesion had markedly increased in the range of an irregularly-contoured  43 x 43 x 50-millimeter lesion, again involving the thalamus with  hemorrhage into the right lateral ventricle.  The quadrigeminal cistern  was open.   This happened despite the fact the patient had received 5 mg of vitamin  K and 2 units of fresh frozen plasma.  The plasma was going in at the  time that he worsened.  His blood pressure systolic was never higher  than 140 and his diastolic was as high as 101 but was  91 at the time of  his rebleed.   MEDICAL PROBLEMS:  Hypertension, dyslipidemia and atrial fibrillation.  He was obese and had a sedentary lifestyle.   His current medications include Coumadin, Cialis or Levitra, Crestor,  Diovan, zolpidem.   DRUG ALLERGIES:  SULFA and SHELLFISH.   SURGICAL HISTORY:  Skin cancers removed.   FAMILY HISTORY:  Father had ALS and a myocardial infarction.  Mother had  cancer of the breast, which was fatal.   SOCIAL HISTORY:  This is the 15th a year of his fourth marriage.  He has  four sons.  He quit smoking in November 2007 and before that smoked a  half to one pack of cigarettes per day.  He drinks of less than 2-4  ounces of alcohol per day, usually wine or beer.  He does not use  illicit drugs.   REVIEW OF SYSTEMS:  The patient has had tremor an clumsiness.  CARDIOVASCULAR:  Atrial fibrillation.  PULMONARY:  No bronchitis or  COPD.  GASTROINTESTINAL:  No  nausea, vomiting, diarrhea.  MUSCULOSKELETAL:  No fractures or arthritis.  ENDOCRINE:  No diabetes or  thyroid disease.  ALLERGY/IMMUNOLOGY:  No known environmental allergies.  HEMATOLOGIC/LYMPHATIC:  No anemia or bruisability.  NEUROPSYCHIATRIC:  No depression.  REPRODUCTIVE:  Erectile dysfunction.   EXAMINATION TODAY:  Temperature unknown, blood pressure 134/111, heart  rate 88, respirations 24 unlabored, oxygen saturation 100% .  EAR, NOSE AND THROAT:  No infections.  LUNGS:  Clear.  HEART:  No murmurs.  Pulses irregularly irregular, no bruits.  ABDOMEN:  Soft.  Bowel sounds normal.  NIH Stroke Scale went from 4 up to at least 10 and possibly more.  The  patient's deterioration was associated with increased slurred speech,  probable neglect, forced deviation of his eyes to the right, 2/5  strength at most on the left side in the arm and the leg.   LABORATORY STUDIES:  Sodium 138, potassium 4.1, chloride 107, BUN 11,  creatinine 1.3, glucose 101.  White count 6800, hemoglobin 13.8,   hematocrit 39.6, platelet count 230,000.  PT 27.1, INR 2.4, PTT 44.  CT  scan has been described.   IMPRESSION:  1. Primary hemorrhage.  2. Hypertension.  3. Coumadin for atrial fibrillation status post multiple of      subcortical infarctions.   We have reversed his Coumadin, but we have failed to prevent increase in  his hemorrhage.  I have spoken with Dr. Phoebe Perch of neurosurgery.  He  believes that there is not a surgical intervention that is appropriate.  The patient has not developed hydrocephalus.  There is some blood in the  ventricles.  He has not yet expanded his hemorrhage or mass effect to  compromise the brainstem.   I have intubated the patient in order to protect his airway.  At  present, I do not see that there is any other are reasonable treatment  that could be offered to him.  I will use a Cardene drip to keep his  blood pressure below 140 systolic and below 90 diastolic.  I do not  believe that he should be a candidate for the ACCELERATE trial, because  at present, his blood pressure does not meet criteria.   I have shown the family the CT scans and explained the very dire nature  of this [name]' medical and neurologic condition.  He may survive  this, but if he does it will be with very significant impairment.  I  spent two hours of face to face time providing critical care: taking  history and examining the patient, evaluating his CT scans, ordering  diagnostic tests and therapeutic treatments, and discussing his clinical  situation with his family.   I appreciate the opportunity to participate in his care.      Deanna Artis. Sharene Skeans, M.D.  Electronically Signed     WHH/MEDQ  D:  02/06/2007  T:  02/07/2007  Job:  [id number]   cc:   Thomas C. Daleen Squibb, MD, [facility]  Thora Lance, M.D.

## 2010-10-10 NOTE — H&P (Signed)
Gary Reynolds, Gary Reynolds                 ACCOUNT NO.:  [id number]   MEDICAL RECORD NO.:  [id number]          PATIENT TYPE:  INP   LOCATION:  3031                         FACILITY:  [facility]   PHYSICIAN:  Ranelle Oyster, M.D.DATE OF BIRTH:  [birth year]   DATE OF ADMISSION:  02/19/2007  DATE OF DISCHARGE:                              HISTORY & PHYSICAL   CHIEF COMPLAINT:  Left-sided weakness and sensory loss as well as  confusion.   HISTORY OF PRESENT ILLNESS:  This is a 70 year old white male with  history of CAD and atrial fibrillation admitted on September 11 with  left-sided weakness and left facial droop.  Head CT revealed an acute  right thalamic hemorrhage.  Treated with fresh frozen plasma.  He had  increased size of his bleed initially, and this was treated with  ventriculostomy placement on September 12.  The patient can be sedated  and was intubated to protect airway.  He was extubated eventually on  September 16.  Carotid Dopplers were negative.  Follow-up CT on  September 18 revealed a stable right thalamic and basal ganglia  hemorrhage.  The patient felt a fever and was treat with IV antibiotics.  Sputum culture essentially was normal and antibiotics were discontinue.  Modified barium swallowing was attempted initially, but the patient was  too lethargic.  Another one is scheduled today at 1 o'clock.  Nursing  notes occasional cough.  He has had a low grade temperature of 99  yesterday.  The patient continues to struggle with mobility and self-  care and this was admitted to the inpatient rehab unit today.   PAST MEDICAL HISTORY:  Positive for the above as well as:  1. TIA.  2. Obesity.  3. Dyslipidemia.  4. Excision of skin cancer.  5. He is a previous smoker as well.   FAMILY HISTORY:  Positive for CAD, cancer and ALS.   SOCIAL HISTORY:  The patient is married, lives with his wife who is  supportive.  He was sedentary prior to arrival.  He drinks 2-4 ounces of  alcohol  a day.  He has four steps to enter his one level home.  Patient  is a retired Psychologist, educational.  Apparently, he had some patents with the [employer].   FUNCTIONAL STATUS:  Patient is currently a max assist to total assist  for transfers for bed mobility.  He has not ambulated as of this time.  He is max assist for self-care.   ALLERGIES:  Sulfa and shellfish.   CURRENT MEDICATIONS:  1. Sliding scale insulin,  2. Protonix 40 mg daily.  3. Avapro 150 mg daily.  4. HCTZ 12.5 mg daily.  5. Lovenox 40 mg daily.  6. Zocor 40 mg daily.  7. Colace 100 mg b.i.d.  8. Jevity tube feeding.  9. Cardene p.r.n.   PHYSICAL EXAMINATION:  VITAL SIGNS:  Temperature is 97.6, blood pressure  113/79, pulse is 93, respiratory rate 18.  Recent CBGs have been in the  middle 100.  O2 saturation 94% on 2 liters.  GENERAL:  The patient is disheveled,  unshaven.  He is overweight.  HEENT:  Pupils equal, round reactive to light.  Ear, Nose, Throat:  He  is notable for thrush on tongue.  Dentition is fair.  HEART:  Regular rate.  CHEST:  Notable for a few rhonchi.  ABDOMEN: Soft, nontender.  Bowel sounds are positive.  SKIN:  Generally intact throughout.  MUSCULOSKELETAL:  No abnormalities nor pain with passive movement today.  NEUROLOGICAL:  Cranial nerves II-XII revealed left central VII and left  tongue deviation.  Speech is dysarthric.  He has decreased vision to the  left, although, it is hard to discern whether he had a true cut versus a  cut with a left neglect.  He definitely has left inattention, however.  He has decreased sensation and trace to 0/2 on the left side.  He does  not withdraw to pain.  He has trace tone, particularly in the left leg  at the knee and ankle.  No clonus was seen.  Reflexes were slightly  hyperactive at 2++.  Toes were upward.  Motor function was absent in  both the arm and leg on the left side today.  He did move the right arm  and leg spontaneously at 3+ to 4/5,  although it was not consistent.  His  cognition was notable for being alert to name and place as well as the  date, although he could not remember the day of the week.  He definitely  had lagged awareness in general, and was very tangential in his thought  processing, and he did frequent cueing to remain on topic.  He is  improved, however, from our exam 3-4 days ago.   ASSESSMENT AND PLAN:  1. 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  Begin comprehensive inpatient rehab with PT to      assess and treat for range of motion, bed mobility, transfers and      pre-gait.  OT will assess and treat for range of motion, self-care      equipment, etc..  Family also will need extensive education.  Wife      is a primary caregiver.  Speech language pathology will follow for      dysphagia and cognition.  Rehab case manager/social worker will      assess for psychosocial needs and discharge planning.  Estimated      length of stay is 3-4 weeks.  Prognosis fair.  2. DVT prophylaxis with Lovenox.  3. Hyperlipidemia:  Zocor.  4. Dysphagia.  The patient is for modified barium swallow today at 1      o'clock.  He is currently on Jevity tube feeds continuous.  Will      modify regimen pending results of testing today.  5. Blood pressure:  Continue Avapro and HCTZ.  6. Sugars:  Follow on sliding scale insulin while he is taking tube      feeds.  The patient may have history of mild diabetes by his      account, although I find no evidence in the chart of such.  He may      have had those insulin resistant type due to weight in the past.  7. Pain management:  Continue Tylenol for now and observe clinically.      Ranelle Oyster, M.D.  Electronically Signed     ZTS/MEDQ  D:  02/19/2007  T:  02/19/2007  Job:  [id number]

## 2010-10-13 NOTE — Discharge Summary (Signed)
[place]. [facility]  Patient:    Gary Reynolds, Gary Reynolds                          MRN: [id number] Adm. Date:  [id number] Disc. Date: [id number] Attending:  Lillia Mountain                           Discharge Summary  REASON FOR ADMISSION:  A 70 year old white male who woke up two days prior to admission with a substernal chest pressure associated with diaphoresis.  This went away after taking some aspirin.  On the morning of January 07, 2000, while a church he again developed chest pain.  He took an aspirin and a Pepcid AC. He went to the walk-in clinic and was sent then to the ER to rule out unstable angina.  On the way to the emergency room he had one sublingual nitroglycerin which resulted in relief of his pain.  The patient has been having similar symptoms about twice a month for the last six months.  He was taking aspirin, and within 15 minutes the pain would go away.  PHYSICAL EXAMINATION:  GENERAL:  He looked well, he is in no distress.  VITAL SIGNS:  Blood pressure is 132/86, heart rate 72 to 80, respirations 16.  His physical exam was normal.  LABORATORY DATA:  WBC 7.1, hemoglobin 14.6, platelets 253.  CK 80, MB 0.6, troponin less than 0.3. PT 12.2, INR 0.9, PTT 30.  Sodium 139, potassium 3.8, chloride 108, bicarbonate 25, BUN 14, creatinine 0.8, glucose 99.  Liver function tests normal.  EKG showed normal sinus rhythm, and normal EKG.  Chest x-ray showed questionable cardiac enlargement and minimal right basilar atelectasis.  HOSPITAL COURSE:  The patient was admitted to rule out unstable angina.  He was placed on IV heparin and Protonix and aspirin.  Serial cardiac enzymes were negative.  Dr. Donnie Aho was consulted.  A stress Cardiolite examination was done on January 08, 2000, and showed no evidence of ischemia infarction and normal LV wall motion with an ejection fraction of 55%.  The patient was discharged in good condition.  DISCHARGE DIAGNOSIS:   Chest pain.  PROCEDURES:  Stress Cardiolite.  DISCHARGE MEDICATIONS:  Prevacid 30 mg a day.  FOLLOWUP:  Dr. Valentina Lucks.  DIET:  No restrictions. DD:  02/23/00 TD:  02/23/00 Job: [id number]

## 2010-10-13 NOTE — H&P (Signed)
[name]. [facility]  Patient:    Gary Reynolds, Gary Reynolds                          MRN: [id number] Adm. Date:  [id number] Attending:  Lillia Mountain CC:         Thora Lance, M.D.   History and Physical  CHIEF COMPLAINT:  Chest pain.  HISTORY OF PRESENT ILLNESS:  This 70 year old white male first woke up with about 10/10 substernal chest pressure on January 06, 2000 radiating "down the esophagus and felt like he needed to belch."  This was associated with diaphoresis. No nausea, vomiting or shortness of breath.  He took aspirin and this went away while playing golf. He was okay until the a.m. of January 07, 2000 when he was at church and developed chest pain.  He took an aspirin and then took Pepcid AC.  The patients pain did not go away but he was able to take a nap. The pain went down to 3/10.  He then went to the walk-in clinic. EKG was done and was unremarkable.  He was told to come to the ER because of his symptoms. He had one sublingual nitroglycerin in the ambulance. The patient is now pain free.  The patient also has been having these symptoms about twice a month for the past six months. He would take an aspirin and every time about 15 minutes later the pain would go away.  Chest pain is not associated with exertion at all.  PAST MEDICAL HISTORY:  Hypertension.  He took Ziac years ago and was discontinued secondary to blood pressure being well in response.  Hypertension is fairly labile.  Bronchitis in the past secondary to smoking.  PAST SURGICAL HISTORY:  The patient has had two cysts removed by Dr. Francina Ames.  MEDICATIONS:  He is no medications regularly. He takes occasional Pepcid AC.  ALLERGIES:  He is allergic to sulfa.  SOCIAL HISTORY:  He smokes about one pack every two weeks.  Alcohol: He has two drinks a day.  Sometimes more but denies recreational drugs.  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  He has five children who  live in the area.  FAMILY HISTORY:  Mother died of lung cancer.  Father died of ALS and had an MI at age 110.  PHYSICAL EXAMINATION:  GENERAL: The patient looks well. In no distress whatsoever.  VITAL SIGNS:  Blood pressure 132 to 142 over 86 to 90.  Respiratory rate 16, pulse 72 to 80 and regular.  HEENT:  Normocephalic and atraumatic.  Pupils equal, round and reactive to light and accommodation.  Extraocular movements are intact.  Funduscopic exam is benign.  No carotid bruits.  No thyromegaly.  CARDIOVASCULAR:  Regular rate and rhythm without murmurs, rubs or gallops.  LUNGS: Clear to auscultation bilaterally.  ABDOMEN:  Soft and nontender.  Normoactive bowel sounds.  No hepatosplenomegaly.  No carotid bruits.  EXTREMITIES: No clubbing, cyanosis or edema.  PULSES: Bilaterally 2+. No femoral bruits.  NEUROLOGIC: Nonfocal.  RECTAL/PROSTATE:  Okay and hemoccult negative.  EKG shows normal sinus rhythm.  Rate is 67.  Inverted Ts in lead 3, otherwise normal EKG.  Portable chest x-ray shows questionable cardiac enlargement. Minimal right basilar atelectasis, otherwise no active disease.  LABORATORY DATA:  Shows white count 7.1, hemoglobin 14.6, hematocrit 40.3, platelet count 253.  CK 80.  MB 0.6.  Relative index 0.8.  Troponin less 0.3. PT of 12.2 and INR of 0.9.  PTT 30.  Electrolytes were within normal limits. Sodium 139, potassium 3.8, chloride 108, CO2 25.  BUN 14, creatinine 0.8. Glucose 99.  LFTs normal.  ASSESSMENT/PLAN: 1. Chest pain, atypical. Rule out cardiac.  Get serial enzymes and EKGs.    Heparin.  If negative consider Cardiolite.  The patient prefers,    Dr. Roger Shelter and Dr. Cassell Clement for cardiology. 2. This may well be gastroesophageal reflux disease.  We will start the    patient on Protonix. DD:  01/08/00 TD:  01/08/00 Job: [id number]

## 2010-10-13 NOTE — Consult Note (Signed)
[place]. [facility]  Patient:    Gary Reynolds, Gary Reynolds                          MRN: [id number] Proc. Date: 01/08/00 Adm. Date:  [id number] Disc. Date: [id number] Attending:  Lillia Mountain CC:         Thora Lance, M.D.                          Consultation Report  HISTORY OF PRESENT ILLNESS:  Thank you for asking me to see this 70 year old male for cardiologic opinion regarding chest pain and for Cardiolite testing.  The patient has a history of chest discomfort which awakened him from sleep Friday evening.  It radiated up into his esophagus.  He felt like he needed to belch, associated with diaphoresis.  He took an aspirin.  The pain lasted around eight hours and went away the next day while he was playing golf.  He was sitting in church this morning and had the onset of chest discomfort.  He went out to his car, took an aspirin and Pepcid AC.  He was able to take a nap, but then went to the [facility] walk-in clinic and was told to come to the emergency room.  He received sublingual nitroglycerin in the ambulance on the way in.  Whether or not the pain went away is a matter of opinion.  He said that it possibly did not go away.  He has had episodic chest discomfort over the past year and had a negative treadmill test a year ago by Dr. Valentina Lucks.  He was evaluated three years ago by Dr. Patty Sermons with chest discomfort and told that he had hypertension, and was placed on Ziac which was later discontinued because of low blood pressure.  Since admission he has had an MI ruled out.  He has had no recurrent pain.  CPKs were negative.  PAST MEDICAL HISTORY:  Labile hypertension, some bronchitis, thinks that his cholesterol is okay, no diabetes.  PAST SURGICAL HISTORY:  Cysts removed.  ALLERGIES:  SULFA and SHELL FISH.  CURRENT MEDICATIONS:  Occasionally Pepcid AC.  FAMILY HISTORY:  Negative for heart disease.  Father died at age 28 of amyotrophic  lateral sclerosis.  Mother died of lung cancer.  SOCIAL HISTORY:  He is a Medical illustrator.  He has been married twice; to his second wife for seven years.  He has cut his smoking down, and maybe smokes a pack every two weeks.  He drinks occasionally alcohol.  Denies recreational drugs. No claudication or TIAs.  PHYSICAL EXAMINATION:  On exam he is an obese male in no acute distress. Blood pressure 140/80.  Pulse 76.  SKIN:  Warm and dry.  HEENT:  Normal.  NECK:  No carotid bruits.  CHEST:  Clear.  HEART:  Normal S1, S2, no S3.  ABDOMEN:  Soft, nontender.  EXTREMITIES:  Terminal distal pulses are 2+.  LABORATORY DATA:  A 12-lead ECG shows moderate nonspecific ST and T wave abnormalities laterally.  CBC and chemistry panel are normal.  CPK and MBs were normal.  IMPRESSION:  Chest discomfort with some typical, other atypical, features.  MI has been ruled out.  He does have risk factors of smoking.  The prolonged nature of the pain and incomplete relief with nitroglycerin is against angina.  RECOMMENDATIONS:  My recommendations would be to proceed with a stress Cardiolite study  to determine if he has any provokable ischemia.  He should also be treated with a proton pump inhibitor and aspirin.  I appreciate seeing him with you and will watch for the results of the stress Cardiolite study. DD:  02/08/00 TD:  01/08/00 Job: [id number]

## 2011-03-07 LAB — BASIC METABOLIC PANEL
BUN: 19
BUN: 19
BUN: 23
CO2: 22
CO2: 23
CO2: 23
Calcium: 8.9
Calcium: 9
Calcium: 9.1
Chloride: 101
Chloride: 103
Chloride: 105
Creatinine, Ser: 0.98
Creatinine, Ser: 0.99
Creatinine, Ser: 1.05
GFR calc Af Amer: >60
GFR calc Af Amer: >60
GFR calc Af Amer: >60
GFR calc non Af Amer: >60
GFR calc non Af Amer: >60
GFR calc non Af Amer: >60
Glucose, Bld: 103 — ABNORMAL HIGH
Glucose, Bld: 104 — ABNORMAL HIGH
Glucose, Bld: 95
Potassium: 4.1
Potassium: 4.2
Potassium: 4.3
Sodium: 133 — ABNORMAL LOW
Sodium: 135
Sodium: 135

## 2011-03-07 LAB — URINE MICROSCOPIC-ADD ON

## 2011-03-07 LAB — URINALYSIS, ROUTINE W REFLEX MICROSCOPIC
Glucose, UA: NEGATIVE
Ketones, ur: NEGATIVE
Nitrite: NEGATIVE
Nitrite: POSITIVE — AB
Specific Gravity, Urine: 1.026
Specific Gravity, Urine: 1.028
Urobilinogen, UA: 4 — ABNORMAL HIGH
pH: 5
pH: 5

## 2011-03-07 LAB — CBC
HCT: 35.4 — ABNORMAL LOW
HCT: 35.5 — ABNORMAL LOW
Hemoglobin: 12.2 — ABNORMAL LOW
Hemoglobin: 12.3 — ABNORMAL LOW
MCHC: 34.3
MCHC: 34.8
MCV: 90.6
MCV: 91.5
Platelets: 284
Platelets: 311
RBC: 3.87 — ABNORMAL LOW
RBC: 3.91 — ABNORMAL LOW
RDW: 12.4
RDW: 12.8
WBC: 13.2 — ABNORMAL HIGH
WBC: 13.9 — ABNORMAL HIGH

## 2011-03-07 LAB — URINE CULTURE
Colony Count: NO GROWTH
Culture: NO GROWTH
Special Requests: NEGATIVE
Special Requests: POSITIVE

## 2011-03-08 LAB — BASIC METABOLIC PANEL
BUN: 25 — ABNORMAL HIGH
BUN: 35 — ABNORMAL HIGH
CO2: 24
CO2: 26
CO2: 30
Calcium: 9.3
Calcium: 9.4
Calcium: 8.5
Chloride: 108
Chloride: 101
Chloride: 102
Creatinine, Ser: 1.01
Creatinine, Ser: 0.65
GFR calc Af Amer: >60
GFR calc Af Amer: >60
GFR calc Af Amer: >60
GFR calc Af Amer: >60
GFR calc Af Amer: >60
GFR calc Af Amer: >60
GFR calc Af Amer: >60
GFR calc non Af Amer: >60
GFR calc non Af Amer: >60
GFR calc non Af Amer: >60
GFR calc non Af Amer: >60
Glucose, Bld: 107 — ABNORMAL HIGH
Glucose, Bld: 112 — ABNORMAL HIGH
Glucose, Bld: 115 — ABNORMAL HIGH
Glucose, Bld: 121 — ABNORMAL HIGH
Potassium: 4.3
Potassium: 4.5
Potassium: 3.5
Potassium: 3.8
Potassium: 4.1
Potassium: 4.4
Sodium: 134 — ABNORMAL LOW
Sodium: 136
Sodium: 136
Sodium: 134 — ABNORMAL LOW
Sodium: 138
Sodium: 140

## 2011-03-08 LAB — DIFFERENTIAL
Eosinophils Absolute: 0.2
Lymphocytes Relative: 13
Lymphs Abs: 1.7
Monocytes Relative: 6
Neutrophils Relative %: 80 — ABNORMAL HIGH

## 2011-03-08 LAB — BLOOD GAS, ARTERIAL
TCO2: 25.6
pCO2 arterial: 36.5
pH, Arterial: 7.44

## 2011-03-08 LAB — CBC
HCT: 34.8 — ABNORMAL LOW
Hemoglobin: 12.1 — ABNORMAL LOW
Hemoglobin: 13.5
MCHC: 34.2
MCHC: 35.1
MCV: 91.7
RBC: 3.33 — ABNORMAL LOW
RBC: 3.77 — ABNORMAL LOW
RDW: 12
RDW: 12.7
WBC: 10.7 — ABNORMAL HIGH
WBC: 8.4

## 2011-03-08 LAB — CULTURE, RESPIRATORY W GRAM STAIN

## 2011-03-08 LAB — COMPREHENSIVE METABOLIC PANEL
ALT: 40
CO2: 30
Calcium: 9.4
Creatinine, Ser: 0.97
GFR calc Af Amer: >60
GFR calc non Af Amer: >60
Glucose, Bld: 129 — ABNORMAL HIGH
Sodium: 138
Total Protein: 6.8

## 2011-03-08 LAB — URINALYSIS, ROUTINE W REFLEX MICROSCOPIC
Bilirubin Urine: NEGATIVE
Glucose, UA: NEGATIVE
Ketones, ur: NEGATIVE
Specific Gravity, Urine: 1.028
pH: 6

## 2011-03-08 LAB — MAGNESIUM: Magnesium: 2.3

## 2011-03-08 LAB — LIPID PANEL
Cholesterol: 101
LDL Cholesterol: 50
VLDL: 17

## 2011-03-08 LAB — HEMOGLOBIN A1C
Hgb A1c MFr Bld: 5.6
Mean Plasma Glucose: 122

## 2011-03-08 LAB — URINE CULTURE
Colony Count: NO GROWTH
Culture: NO GROWTH

## 2011-03-08 LAB — URINE MICROSCOPIC-ADD ON

## 2011-03-09 LAB — POCT I-STAT 3, ART BLOOD GAS (G3+)
Acid-Base Excess: 3 — ABNORMAL HIGH
Acid-base deficit: 2
Bicarbonate: 23.6
Bicarbonate: 27.6 — ABNORMAL HIGH
O2 Saturation: 99
Operator id: 270211
Operator id: 277551
Patient temperature: 98.1
TCO2: 25
pCO2 arterial: 38.2
pO2, Arterial: 122 — ABNORMAL HIGH
pO2, Arterial: 137 — ABNORMAL HIGH
pO2, Arterial: 545 — ABNORMAL HIGH

## 2011-03-09 LAB — BASIC METABOLIC PANEL
BUN: 15
CO2: 25
CO2: 26
CO2: 26
Calcium: 8.5
Calcium: 8.5
Chloride: 105
Chloride: 105
Chloride: 106
Creatinine, Ser: 1.59 — ABNORMAL HIGH
GFR calc Af Amer: 53 — ABNORMAL LOW
GFR calc Af Amer: >60
Glucose, Bld: 97
Potassium: 3.5
Potassium: 3.8
Sodium: 136
Sodium: 138

## 2011-03-09 LAB — I-STAT 8, (EC8 V) (CONVERTED LAB)
BUN: 11
Bicarbonate: 23.6
Hemoglobin: 13.9
Operator id: 284251
pCO2, Ven: 34.9 — ABNORMAL LOW

## 2011-03-09 LAB — COMPREHENSIVE METABOLIC PANEL
ALT: 15
ALT: 16
AST: 21
Alkaline Phosphatase: 41
CO2: 25
CO2: 26
Calcium: 9
Calcium: 9.2
Chloride: 107
Creatinine, Ser: 0.88
GFR calc Af Amer: >60
GFR calc non Af Amer: >60
GFR calc non Af Amer: >60
Glucose, Bld: 106 — ABNORMAL HIGH
Glucose, Bld: 109 — ABNORMAL HIGH
Potassium: 4.1
Sodium: 138
Total Bilirubin: 1

## 2011-03-09 LAB — DIFFERENTIAL
Basophils Relative: 0
Basophils Relative: 1
Eosinophils Absolute: 0
Eosinophils Absolute: 0.1
Eosinophils Relative: 1
Lymphs Abs: 1.2
Lymphs Abs: 2.5
Monocytes Absolute: 0.8 — ABNORMAL HIGH
Monocytes Relative: 6
Monocytes Relative: 9

## 2011-03-09 LAB — BLOOD GAS, ARTERIAL
Bicarbonate: 23.1
MECHVT: 700
O2 Saturation: 98.1
PEEP: 5
Patient temperature: 98.6
TCO2: 24.2
pH, Arterial: 7.409

## 2011-03-09 LAB — CROSSMATCH
ABO/RH(D): A POS
Antibody Screen: NEGATIVE

## 2011-03-09 LAB — PREPARE FRESH FROZEN PLASMA

## 2011-03-09 LAB — CBC
HCT: 36.3 — ABNORMAL LOW
HCT: 39.6
Hemoglobin: 12.1 — ABNORMAL LOW
Hemoglobin: 12.2 — ABNORMAL LOW
Hemoglobin: 12.8 — ABNORMAL LOW
Hemoglobin: 13.8
MCHC: 34.6
MCHC: 34.6
MCHC: 34.8
MCHC: 34.8
MCHC: 35.3
MCV: 89.9
MCV: 90.9
MCV: 92.1
MCV: 92.2
Platelets: 191
RBC: 3.78 — ABNORMAL LOW
RBC: 3.81 — ABNORMAL LOW
RBC: 3.99 — ABNORMAL LOW
RBC: 4.3
WBC: 10.4
WBC: 12 — ABNORMAL HIGH
WBC: 6.8

## 2011-03-09 LAB — APTT
aPTT: 36
aPTT: 38 — ABNORMAL HIGH

## 2011-03-09 LAB — PROTIME-INR
INR: 1.1
INR: 1.2
INR: 1.3
Prothrombin Time: 15.6 — ABNORMAL HIGH
Prothrombin Time: 16.4 — ABNORMAL HIGH
Prothrombin Time: 17.9 — ABNORMAL HIGH
Prothrombin Time: 20.5 — ABNORMAL HIGH
Prothrombin Time: 27.1 — ABNORMAL HIGH

## 2011-03-09 LAB — CK TOTAL AND CKMB (NOT AT ARMC): Relative Index: INVALID

## 2011-03-09 LAB — CULTURE, BLOOD (ROUTINE X 2): Culture: NO GROWTH

## 2011-03-09 LAB — OSMOLALITY: Osmolality: 380 — ABNORMAL HIGH

## 2013-10-01 ENCOUNTER — Inpatient Hospital Stay (HOSPITAL_COMMUNITY): Payer: Medicare Other

## 2013-10-01 ENCOUNTER — Emergency Department (HOSPITAL_COMMUNITY): Payer: Medicare Other

## 2013-10-01 ENCOUNTER — Encounter (HOSPITAL_COMMUNITY): Payer: Self-pay | Admitting: Emergency Medicine

## 2013-10-01 ENCOUNTER — Inpatient Hospital Stay (HOSPITAL_COMMUNITY)
Admission: EM | Admit: 2013-10-01 | Discharge: 2013-10-12 | DRG: 064 | Disposition: A | Discharge status: Skilled Nursing Facility | Payer: Medicare Other | Attending: Internal Medicine | Admitting: Internal Medicine

## 2013-10-01 DIAGNOSIS — I10 Essential (primary) hypertension: Secondary | ICD-10-CM | POA: Diagnosis present

## 2013-10-01 DIAGNOSIS — I4891 Unspecified atrial fibrillation: Secondary | ICD-10-CM | POA: Diagnosis present

## 2013-10-01 DIAGNOSIS — R911 Solitary pulmonary nodule: Secondary | ICD-10-CM | POA: Diagnosis present

## 2013-10-01 DIAGNOSIS — I69959 Hemiplegia and hemiparesis following unspecified cerebrovascular disease affecting unspecified side: Secondary | ICD-10-CM | POA: Diagnosis not present

## 2013-10-01 DIAGNOSIS — F0390 Unspecified dementia without behavioral disturbance: Secondary | ICD-10-CM | POA: Diagnosis present

## 2013-10-01 DIAGNOSIS — Z993 Dependence on wheelchair: Secondary | ICD-10-CM | POA: Diagnosis not present

## 2013-10-01 DIAGNOSIS — I639 Cerebral infarction, unspecified: Secondary | ICD-10-CM

## 2013-10-01 DIAGNOSIS — R1310 Dysphagia, unspecified: Secondary | ICD-10-CM | POA: Diagnosis present

## 2013-10-01 DIAGNOSIS — Z79899 Other long term (current) drug therapy: Secondary | ICD-10-CM

## 2013-10-01 DIAGNOSIS — Z87442 Personal history of urinary calculi: Secondary | ICD-10-CM

## 2013-10-01 DIAGNOSIS — H5789 Other specified disorders of eye and adnexa: Secondary | ICD-10-CM | POA: Diagnosis present

## 2013-10-01 DIAGNOSIS — J690 Pneumonitis due to inhalation of food and vomit: Secondary | ICD-10-CM | POA: Diagnosis not present

## 2013-10-01 DIAGNOSIS — Z7901 Long term (current) use of anticoagulants: Secondary | ICD-10-CM | POA: Diagnosis not present

## 2013-10-01 DIAGNOSIS — I2510 Atherosclerotic heart disease of native coronary artery without angina pectoris: Secondary | ICD-10-CM | POA: Diagnosis present

## 2013-10-01 DIAGNOSIS — E876 Hypokalemia: Secondary | ICD-10-CM | POA: Diagnosis not present

## 2013-10-01 DIAGNOSIS — R791 Abnormal coagulation profile: Secondary | ICD-10-CM | POA: Diagnosis present

## 2013-10-01 DIAGNOSIS — R5381 Other malaise: Secondary | ICD-10-CM | POA: Diagnosis present

## 2013-10-01 DIAGNOSIS — I6350 Cerebral infarction due to unspecified occlusion or stenosis of unspecified cerebral artery: Principal | ICD-10-CM

## 2013-10-01 DIAGNOSIS — Z87891 Personal history of nicotine dependence: Secondary | ICD-10-CM

## 2013-10-01 DIAGNOSIS — K219 Gastro-esophageal reflux disease without esophagitis: Secondary | ICD-10-CM | POA: Diagnosis present

## 2013-10-01 DIAGNOSIS — R4182 Altered mental status, unspecified: Secondary | ICD-10-CM | POA: Diagnosis present

## 2013-10-01 DIAGNOSIS — R4789 Other speech disturbances: Secondary | ICD-10-CM | POA: Diagnosis present

## 2013-10-01 DIAGNOSIS — M79609 Pain in unspecified limb: Secondary | ICD-10-CM | POA: Diagnosis present

## 2013-10-01 DIAGNOSIS — E785 Hyperlipidemia, unspecified: Secondary | ICD-10-CM | POA: Diagnosis present

## 2013-10-01 DIAGNOSIS — E870 Hyperosmolality and hypernatremia: Secondary | ICD-10-CM | POA: Diagnosis not present

## 2013-10-01 HISTORY — DX: Atherosclerotic heart disease of native coronary artery without angina pectoris: I25.10

## 2013-10-01 HISTORY — DX: Gastro-esophageal reflux disease without esophagitis: K21.9

## 2013-10-01 HISTORY — DX: Hyperlipidemia, unspecified: E78.5

## 2013-10-01 HISTORY — DX: Unspecified atrial fibrillation: I48.91

## 2013-10-01 HISTORY — DX: Cerebral infarction, unspecified: I63.9

## 2013-10-01 HISTORY — DX: Essential (primary) hypertension: I10

## 2013-10-01 LAB — CBC WITH DIFFERENTIAL/PLATELET
BASOS ABS: 0 10*3/uL (ref 0.0–0.1)
BASOS PCT: 0 % (ref 0–1)
EOS PCT: 2 % (ref 0–5)
Eosinophils Absolute: 0.2 10*3/uL (ref 0.0–0.7)
HEMATOCRIT: 43.3 % (ref 39.0–52.0)
Hemoglobin: 14.7 g/dL (ref 13.0–17.0)
Lymphocytes Relative: 30 % (ref 12–46)
Lymphs Abs: 2.8 10*3/uL (ref 0.7–4.0)
MCH: 31.2 pg (ref 26.0–34.0)
MCHC: 33.9 g/dL (ref 30.0–36.0)
MCV: 91.9 fL (ref 78.0–100.0)
MONO ABS: 0.8 10*3/uL (ref 0.1–1.0)
MONOS PCT: 8 % (ref 3–12)
Neutro Abs: 5.7 10*3/uL (ref 1.7–7.7)
Neutrophils Relative %: 60 % (ref 43–77)
Platelets: 173 10*3/uL (ref 150–400)
RBC: 4.71 MIL/uL (ref 4.22–5.81)
RDW: 13.4 % (ref 11.5–15.5)
WBC: 9.6 10*3/uL (ref 4.0–10.5)

## 2013-10-01 LAB — URINE MICROSCOPIC-ADD ON

## 2013-10-01 LAB — COMPREHENSIVE METABOLIC PANEL
ALBUMIN: 3.5 g/dL (ref 3.5–5.2)
ALT: 17 U/L (ref 0–53)
AST: 21 U/L (ref 0–37)
Alkaline Phosphatase: 70 U/L (ref 39–117)
BUN: 15 mg/dL (ref 6–23)
CALCIUM: 9.5 mg/dL (ref 8.4–10.5)
CO2: 22 meq/L (ref 19–32)
CREATININE: 0.77 mg/dL (ref 0.50–1.35)
Chloride: 105 mEq/L (ref 96–112)
GFR calc Af Amer: >90 mL/min (ref >90)
GFR, EST NON AFRICAN AMERICAN: 89 mL/min — AB (ref >90)
Glucose, Bld: 85 mg/dL (ref 70–99)
Potassium: 4.4 mEq/L (ref 3.7–5.3)
Sodium: 141 mEq/L (ref 137–147)
Total Bilirubin: 0.3 mg/dL (ref 0.3–1.2)
Total Protein: 7.2 g/dL (ref 6.0–8.3)

## 2013-10-01 LAB — URINALYSIS, ROUTINE W REFLEX MICROSCOPIC
Bilirubin Urine: NEGATIVE
GLUCOSE, UA: NEGATIVE mg/dL
Ketones, ur: NEGATIVE mg/dL
LEUKOCYTES UA: NEGATIVE
NITRITE: NEGATIVE
PROTEIN: NEGATIVE mg/dL
Specific Gravity, Urine: 1.016 (ref 1.005–1.030)
Urobilinogen, UA: 0.2 mg/dL (ref 0.0–1.0)
pH: 5.5 (ref 5.0–8.0)

## 2013-10-01 LAB — AMMONIA: AMMONIA: 35 umol/L (ref 11–60)

## 2013-10-01 LAB — PROTIME-INR
INR: 1.8 — ABNORMAL HIGH (ref 0.00–1.49)
PROTHROMBIN TIME: 20.4 s — AB (ref 11.6–15.2)

## 2013-10-01 LAB — HEPARIN LEVEL (UNFRACTIONATED): Heparin Unfractionated: 0.14 IU/mL — ABNORMAL LOW (ref 0.30–0.70)

## 2013-10-01 LAB — TROPONIN I: Troponin I: <0.3 ng/mL (ref <0.30)

## 2013-10-01 LAB — I-STAT TROPONIN, ED: Troponin i, poc: 0 ng/mL (ref 0.00–0.08)

## 2013-10-01 MED ORDER — HEPARIN (PORCINE) IN NACL 100-0.45 UNIT/ML-% IJ SOLN
1150.0000 [IU]/h | INTRAMUSCULAR | Status: DC
  Filled 2013-10-01: qty 250

## 2013-10-01 MED ORDER — SENNOSIDES-DOCUSATE SODIUM 8.6-50 MG PO TABS
1.0000 | ORAL_TABLET | Freq: Every evening | ORAL | Status: DC | PRN
  Administered 2013-10-11: 1 via ORAL
  Filled 2013-10-01 (×2): qty 1

## 2013-10-01 MED ORDER — ACETAMINOPHEN 325 MG PO TABS
650.0000 mg | ORAL_TABLET | Freq: Three times a day (TID) | ORAL | Status: DC
Start: 2013-10-01 — End: 2013-10-04
  Administered 2013-10-01 – 2013-10-02 (×2): 650 mg via ORAL
  Filled 2013-10-01 (×2): qty 2

## 2013-10-01 MED ORDER — WARFARIN SODIUM 4 MG PO TABS
4.0000 mg | ORAL_TABLET | Freq: Once | ORAL | Status: AC
  Administered 2013-10-01: 4 mg via ORAL
  Filled 2013-10-01 (×2): qty 1

## 2013-10-01 MED ORDER — ACETAMINOPHEN 325 MG PO TABS
650.0000 mg | ORAL_TABLET | Freq: Once | ORAL | Status: AC
  Administered 2013-10-01: 650 mg via ORAL
  Filled 2013-10-01: qty 2

## 2013-10-01 MED ORDER — TRAMADOL HCL 50 MG PO TABS: 50.0000 mg | ORAL_TABLET | Freq: Four times a day (QID) | ORAL | Status: DC | PRN

## 2013-10-01 MED ORDER — SODIUM CHLORIDE 0.9 % IV SOLN
Freq: Once | INTRAVENOUS | Status: AC
  Administered 2013-10-01: 13:00:00 via INTRAVENOUS

## 2013-10-01 MED ORDER — LORAZEPAM 2 MG/ML IJ SOLN
0.5000 mg | Freq: Once | INTRAMUSCULAR | Status: AC
  Administered 2013-10-01: 0.5 mg via INTRAVENOUS

## 2013-10-01 MED ORDER — HEPARIN (PORCINE) IN NACL 100-0.45 UNIT/ML-% IJ SOLN
1000.0000 [IU]/h | INTRAMUSCULAR | Status: DC
  Administered 2013-10-01: 1000 [IU]/h via INTRAVENOUS
  Filled 2013-10-01: qty 250

## 2013-10-01 MED ORDER — HEPARIN (PORCINE) IN NACL 100-0.45 UNIT/ML-% IJ SOLN
1100.0000 [IU]/h | INTRAMUSCULAR | Status: DC
  Administered 2013-10-01 – 2013-10-02 (×2): 1200 [IU]/h via INTRAVENOUS
  Administered 2013-10-03: 1350 [IU]/h via INTRAVENOUS
  Filled 2013-10-01 (×5): qty 250

## 2013-10-01 MED ORDER — WARFARIN - PHARMACIST DOSING INPATIENT: Freq: Every day | Status: DC

## 2013-10-01 MED ORDER — LORAZEPAM 2 MG/ML IJ SOLN: 0.5000 mg | Freq: Once | INTRAMUSCULAR | Status: AC | PRN

## 2013-10-01 MED ORDER — LORAZEPAM 2 MG/ML IJ SOLN
INTRAMUSCULAR | Status: AC
  Filled 2013-10-01: qty 1

## 2013-10-01 NOTE — H&P (Signed)
Patient Demographics  Gary Reynolds, is a 73 y.o. male  MRN: [id number]   DOB - [birth year]  Admit Date - 10/01/2013  Outpatient Primary MD for the patient is No primary provider on file.   With History of -  Past Medical History  Diagnosis Date  . Stroke   . Hypertension   . Coronary artery disease   . Atrial fibrillation   . GERD 11/25/2006    Qualifier: Diagnosis of  By: Leanne Chang MD, Bruce    . HYPERLIPIDEMIA 11/25/2006    Qualifier: Diagnosis of  By: Leanne Chang MD, Bruce        No past surgical history on file.  in for   Chief Complaint  Patient presents with  . Weakness     HPI  Gary Reynolds  is a 73 y.o. 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 Patient's lethargy was improved somewhat with some IV fluids. Neuro hospitalists were consulted for TIA versus CVA and I was requested to admit the patient.   Review of Systems  he feels weak all over and some pain in the left upper arm but otherwise negative review of systems.  In addition to the HPI above,   No Fever-chills, No Headache, No changes with Vision or hearing, No problems swallowing food or Liquids, No Chest pain, Cough or Shortness of Breath, No Abdominal pain, No Nausea or Vommitting, Bowel movements are regular, No Blood in stool or Urine, No dysuria, No new skin rashes or bruises, No  new joints pains-aches, left upper arm pain and ache since last night No new weakness, tingling, numbness in any extremity, weak all over No recent weight gain or loss, No polyuria, polydypsia or polyphagia, No significant Mental Stressors.  A full 10 point Review of Systems was done, except as stated above, all other Review of Systems were negative.   Social History History  Substance Use Topics  . Smoking status: Former Research scientist (life sciences)  . Smokeless tobacco: Not on file  . Alcohol Use: No      Family History Family History  Problem Relation Age of Onset  . Hyperlipidemia Mother   . Hypertension Mother   . Hypertension Father       Prior to Admission medications   Medication Sig Start Date End Date Taking? Authorizing Provider  acetaminophen (TYLENOL) 500 MG tablet Take 1,000 mg by mouth 3 (three) times daily.    Yes Historical Provider, MD  acetaminophen (TYLENOL) 500 MG tablet Take 1,000 mg by mouth 2 (  two) times daily as needed (for temp >101.0).   Yes Historical Provider, MD  ALPRAZolam Duanne Moron) 0.5 MG tablet Take 0.5 mg by mouth daily as needed for anxiety.   Yes Historical Provider, MD  atenolol (TENORMIN) 25 MG tablet Take 25 mg by mouth daily.   Yes Historical Provider, MD  bisacodyl (BISACODYL) 5 MG EC tablet Take 5 mg by mouth 2 (two) times daily as needed for moderate constipation.   Yes Historical Provider, MD  cholecalciferol (VITAMIN D) 1000 UNITS tablet Take 1,000 Units by mouth daily.   Yes Historical Provider, MD  cyclobenzaprine (FLEXERIL) 5 MG tablet Take 5 mg by mouth 2 (two) times daily as needed (back pain).   Yes Historical Provider, MD  divalproex (DEPAKOTE SPRINKLE) 125 MG capsule Take 125 mg by mouth 2 (two) times daily.   Yes Historical Provider, MD  docusate sodium (COLACE) 100 MG capsule Take 100 mg by mouth 2 (two) times daily.   Yes Historical Provider, MD  DULoxetine (CYMBALTA) 20 MG capsule Take 20 mg by mouth daily.   Yes Historical Provider, MD    gabapentin (NEURONTIN) 100 MG capsule Take 100 mg by mouth 3 (three) times daily.   Yes Historical Provider, MD  isosorbide mononitrate (IMDUR) 30 MG 24 hr tablet Take 30 mg by mouth daily.   Yes Historical Provider, MD  magnesium citrate SOLN Take 1 Bottle by mouth daily as needed for severe constipation. Use only for no BM in 24 hours   Yes Historical Provider, MD  methylphenidate (RITALIN) 20 MG tablet Take 20-40 mg by mouth See admin instructions. Take 40 mg by mouth in the morning, then 20 mg by mouth  in the afternoon (1400)   Yes Historical Provider, MD  nitroGLYCERIN (NITROSTAT) 0.4 MG SL tablet Place 0.4 mg under the tongue every 5 (five) minutes as needed for chest pain.   Yes Historical Provider, MD  OLANZapine (ZYPREXA) 2.5 MG tablet Take 2.5 mg by mouth at bedtime.   Yes Historical Provider, MD  omeprazole (PRILOSEC) 20 MG capsule Take 20 mg by mouth See admin instructions. Take 20 mg by mouth on Monday, Wednesday, Friday, and Saturday.   Yes Historical Provider, MD  polyethylene glycol (MIRALAX / GLYCOLAX) packet Take 17 g by mouth 2 (two) times daily.   Yes Historical Provider, MD  Pyrithione Zinc [facility] DAILY EX) Apply 1 application topically 2 (two) times a week. During showers   Yes Historical Provider, MD  senna (SENOKOT) 8.6 MG TABS tablet Take 2 tablets by mouth at bedtime as needed for mild constipation.   Yes Historical Provider, MD  simvastatin (ZOCOR) 20 MG tablet Take 20 mg by mouth every evening.   Yes Historical Provider, MD  traMADol (ULTRAM-ER) 100 MG 24 hr tablet Take 100 mg by mouth 3 (three) times daily.    Yes Historical Provider, MD  warfarin (COUMADIN) 2 MG tablet Take 2 mg by mouth See admin instructions. Take 2 mg by mouth on Monday, Wednesday, and Friday   Yes Historical Provider, MD  warfarin (COUMADIN) 3 MG tablet Take 3 mg by mouth See admin instructions. Take 3 mg by mouth on Tuesday, Thursday, Saturday, and Sunday   Yes Historical Provider, MD     Allergies  Allergen Reactions  . Codeine Other (See Comments)    Unknown reaction (per MAR)   . Food Other (See Comments)    Seafood - unknown reaction  (per MAR)  . Shellfish Allergy Other (See Comments)    Unknown reaction (  per [facility])  . Sulfa Antibiotics Other (See Comments)    Unknown reaction (per MAR)  . Wellbutrin [Bupropion] Other (See Comments)    Unknown reaction (per [facility])    Physical Exam  Vitals  Blood pressure 146/92, pulse 72, temperature 98.3 F (36.8 C), temperature source Oral, resp. rate 14, SpO2 97.00%.   1. General elderly white male lying in bed in NAD,    2. Normal affect and insight, Not Suicidal or Homicidal, Awake Alert, Oriented X 3.  3. No F.N deficits, ALL C.Nerves Intact, strength 0/5 left arm, 1/5 left foot, 5 over 5 right side  4. Ears and Eyes appear Normal, Conjunctivae clear, PERRLA. Moist Oral Mucosa.  5. Supple Neck, No JVD, No cervical lymphadenopathy appriciated, No Carotid Bruits.  6. Symmetrical Chest wall movement, Good air movement bilaterally, CTAB.  7. iRRR, No Gallops, Rubs or Murmurs, No Parasternal Heave.  8. Positive Bowel Sounds, Abdomen Soft, Non tender, No organomegaly appriciated,No rebound -guarding or rigidity.  9.  No Cyanosis, Normal Skin Turgor, No Skin Rash or Bruise.  10. Good muscle tone,  joints appear normal , no effusions, Normal ROM.  11. No Palpable Lymph Nodes in Neck or Axillae     Data Review  CBC  Recent Labs Lab 10/01/13 0932  WBC 9.6  HGB 14.7  HCT 43.3  PLT 173  MCV 91.9  MCH 31.2  MCHC 33.9  RDW 13.4  LYMPHSABS 2.8  MONOABS 0.8  EOSABS 0.2  BASOSABS 0.0   ------------------------------------------------------------------------------------------------------------------  Chemistries   Recent Labs Lab 10/01/13 0932  NA 141  K 4.4  CL 105  CO2 22  GLUCOSE 85  BUN 15  CREATININE 0.77  CALCIUM 9.5  AST 21  ALT 17  ALKPHOS 70  BILITOT 0.3    ------------------------------------------------------------------------------------------------------------------ CrCl is unknown because there is no height on file for the current visit. ------------------------------------------------------------------------------------------------------------------ No results found for this basename: TSH, T4TOTAL, FREET3, T3FREE, THYROIDAB,  in the last 72 hours   Coagulation profile  Recent Labs Lab 10/01/13 0932  INR 1.80*   ------------------------------------------------------------------------------------------------------------------- No results found for this basename: DDIMER,  in the last 72 hours -------------------------------------------------------------------------------------------------------------------  Cardiac Enzymes No results found for this basename: CK, CKMB, TROPONINI, MYOGLOBIN,  in the last 168 hours ------------------------------------------------------------------------------------------------------------------ No components found with this basename: POCBNP,    ---------------------------------------------------------------------------------------------------------------  Urinalysis    Component Value Date/Time   COLORURINE YELLOW 10/01/2013 [place] 10/01/2013 1108   LABSPEC 1.016 10/01/2013 1108   PHURINE 5.5 10/01/2013 1108   [place] 10/01/2013 1108   HGBUR TRACE* 10/01/2013 [place] 10/01/2013 [place] 10/01/2013 [place] 10/01/2013 1108   UROBILINOGEN 0.2 10/01/2013 1108   NITRITE NEGATIVE 10/01/2013 [place] 10/01/2013 1108    ----------------------------------------------------------------------------------------------------------------  Imaging results:   Dg Chest 2 View  10/01/2013   CLINICAL DATA:  Weakness.  EXAM: CHEST  2 VIEW  COMPARISON:  PA and lateral chest 02/20/2007.  FINDINGS: The lungs are clear. Heart size  is normal. There is no pneumothorax or pleural effusion. No focal bony abnormality.  IMPRESSION: No acute disease.   Electronically Signed   By: Inge Rise M.D.   On: 10/01/2013 10:28   Ct Head Wo Contrast  10/01/2013   CLINICAL DATA:  Weakness.  Lethargy.  EXAM: CT HEAD WITHOUT CONTRAST  TECHNIQUE: Contiguous axial images were obtained from the base of the skull through the vertex without intravenous contrast.  COMPARISON:  04/04/2007  FINDINGS: There is no acute intracranial hemorrhage, infarction, or mass lesion. There is an old partially calcified stroke in the right basal ganglia. There is extensive periventricular white matter lucency, worse on the right than the left, consistent with chronic ischemic changes. There is a small old right cerebellar infarct. There is diffuse atrophy with secondary slight ventricular dilatation.  No acute osseous abnormalities. Dense calcification in the distal vertebral arteries.  IMPRESSION: No acute intracranial abnormality. Atrophy with old strokes and chronic small vessel ischemic changes. Atrophy has slightly progressed since the prior study.   Electronically Signed   By: Rozetta Nunnery M.D.   On: 10/01/2013 11:07    My personal review of EKG: Rhythm Afib, Rate  57 /min, no Acute ST changes    Assessment & Plan   1. Decreased mental status. Etiology unclear. Could be TIA versus CVA. He does have atrial fibrillation with subtherapeutic INR, his CHADVAS 2 score is 4 - we'll admit to a telemetry bed, will start him on heparin drip without bolus to be dosed by pharmacy along with Coumadin overlap, heparin will be stopped once INR is 2. She will get full stroke workup with monitoring on tele, obtain PT, OT, speech input, A1c and lipid panel, check MRI MRA brain, echogram, carotid duplex, neurology consulted. For now anticoagulation plus any antiplatelet medication that neurology recommends. We'll also check EEG to rule out underlying seizure.   2. Atrial  fibrillation. Currently in rate control, continue on metoprolol, Coumadin with heparin overlap as above will be continued. Pharmacy consult for both.   3. History of CAD. No acute issues, however he does have left arm pain, first set of troponin negative, we'll continue beta blocker, statin, Imdur, cycled troponins, echogram to evaluate for EF and wall motion.   4. Left arm pain. Says this has been going on since yesterday, we'll check x-ray of left humerus.   5. Hypertension. No acute issues continue beta blocker and Imdur.   6. GERD. Continue PPI.   7. Dyslipidemia. Continue home dose statin.     DVT Prophylaxis Heparin -  Coumadin  AM Labs Ordered, also please review Full Orders  Family Communication: Admission, patients condition and plan of care including tests being ordered have been discussed with the patient and 2 sons who indicate understanding and agree with the plan and Code Status.  Code Status Full  Likely DC to  SNF  Condition GUARDED   Time spent in minutes : 35    Thurnell Lose M.D on 10/01/2013 at 1:55 PM  Between 7am to 7pm - Pager - [phone]  After 7pm go to [email or web address] - password [email or web address]  And look for the night coverage person covering me after hours  [facility] Office  [phone]

## 2013-10-01 NOTE — Progress Notes (Addendum)
ANTICOAGULATION CONSULT NOTE - Follow up  Pharmacy Consult for Heparin  Indication:  Possible new CVA  Allergies  Allergen Reactions  . Codeine Other (See Comments)    Unknown reaction (per MAR)   . Food Other (See Comments)    Seafood - unknown reaction  (per MAR)  . Shellfish Allergy Other (See Comments)    Unknown reaction (per MAR)  . Sulfa Antibiotics Other (See Comments)    Unknown reaction (per MAR)  . Wellbutrin [Bupropion] Other (See Comments)    Unknown reaction (per [facility])   Patient Measurements: I asked RN to weigh patient and record data in EPIC  Vital Signs: Temp: 98.6 F (37 C) (05/07 1826) Temp src: Oral (05/07 1826) BP: 161/94 mmHg (05/07 1826) Pulse Rate: 81 (05/07 1826)  Labs:  Recent Labs  10/01/13 0932 10/01/13 1332 10/01/13 2052  HGB 14.7  --   --   HCT 43.3  --   --   PLT 173  --   --   LABPROT 20.4*  --   --   INR 1.80*  --   --   HEPARINUNFRC  --   --  0.14*  CREATININE 0.77  --   --   TROPONINI  --  <0.30  --    CrCl is unknown because there is no height on file for the current visit.  Medical History: Past Medical History  Diagnosis Date  . Stroke   . Hypertension   . Coronary artery disease   . Atrial fibrillation   . GERD 11/25/2006    Qualifier: Diagnosis of  By: Leanne Chang MD, Bruce    . HYPERLIPIDEMIA 11/25/2006    Qualifier: Diagnosis of  By: Leanne Chang MD, Bruce     Assessment: The 6 hour heparin level is subtherapeutic at  0.14 on IV heparin drip 1000 units/hr in this 73yo male admitted with possible new acute CVA.  Targeting heparin level of 0.3-0.5 units/ml. The patient has a known history of CVA and Afib and has been on chronic Warfarin therapy prior to admission.  His CBC was stable on admit. No noted bleeding complications. His INR today was 1.8 and we have ordered to give 4mg  of coumadin tonight.  RN report patient weighs above average to obese.   Goal of Therapy:  INR 2-3 Heparin level 0.3-0.5 Monitor platelets by  anticoagulation protocol: Yes   Plan:  Will increase IV heparin drip rate to 1200 units/hr Check 5am HL.  Daily CBC/PT/INR and HL  Monitor closely for s/s of bleeding  Nicole Cella, RPh Clinical Pharmacist Pager: [phone] Thank you for allowing pharmacy to be part of this patients care team. 10/01/2013,9:52 PM

## 2013-10-01 NOTE — ED Notes (Signed)
MRI called, pt c/o lower back pain and yelling "help me." Pt unable to lay flat for exam, Dr. Regenia Skeeter made aware see mar for verbal order of ativan.

## 2013-10-01 NOTE — Progress Notes (Signed)
ANTICOAGULATION CONSULT NOTE - Initial Consult  Pharmacy Consult for Heparin/Warfarin Indication:  Possible new CVA  Allergies  Allergen Reactions  . Codeine Other (See Comments)    Unknown reaction (per MAR)   . Food Other (See Comments)    Seafood - unknown reaction  (per MAR)  . Shellfish Allergy Other (See Comments)    Unknown reaction (per MAR)  . Sulfa Antibiotics Other (See Comments)    Unknown reaction (per MAR)  . Wellbutrin [Bupropion] Other (See Comments)    Unknown reaction (per [facility])   Patient Measurements:  Vital Signs: Temp: 98.3 F (36.8 C) (05/07 1246) Temp src: Oral (05/07 0837) BP: 146/92 mmHg (05/07 1252) Pulse Rate: 72 (05/07 1252)  Labs:  Recent Labs  10/01/13 0932  HGB 14.7  HCT 43.3  PLT 173  LABPROT 20.4*  INR 1.80*  CREATININE 0.77   CrCl is unknown because there is no height on file for the current visit.  Medical History: Past Medical History  Diagnosis Date  . Stroke   . Hypertension   . Coronary artery disease   . Atrial fibrillation    Assessment: 73yo male admitted with increased weakness and lethargy.  He has a known history of CVA and Afib and has been on chronic Warfarin therapy.  His CBC is stable and no noted bleeding complications.  His INR today is 1.8 and we have been asked to resume warfarin and start heparin until he is within range.  Goal of Therapy:  INR 2-3 Heparin level 0.3-0.7 Monitor platelets by anticoagulation protocol: Yes   Plan:  1.  Will start IV heparin at 1000 units/hr 2.  Give Warfarin 4mg  today x 1 3.  Daily CBC/PT/INR and HL 4.  Monitor closely for s/s of bleeding  Rober Minion, PharmD., MS Clinical Pharmacist Pager:  [phone] Thank you for allowing pharmacy to be part of this patients care team. 10/01/2013,1:09 PM

## 2013-10-01 NOTE — Progress Notes (Signed)
Event: Pt arrived from ED with stroke vs. TIA in the process of workup. RN told me family was upset that they haven't been told anything about stroke, plan, etc. This NP to floor and sat down with the pt's 4 sons.  This NP explained what is going on at this time and what a stroke "workup" is. I explained that the MRI did show an infarct and what that means for now. I informed them the humerus xray was negative for fracture, and the EEG showed some "slowing" associated with aging, but no epilepsy. I explained Coumadin, need for heparin gtt at this time, Afib with risk of stroke, testing, timeline for tests and for evaluating pt's improvement or prognosis. I explained what a hospitalists is and that neuro would also be rounding on pt.  They were concerned about poor communication from the ED to the nsg unit.  All their questions were asked and answered. I spent at least 30 minutes with them. After this meeting, they were calm and thankful for the information.  I will report their concerns to appropriate leaders.  Clance Boll, NP [facility]

## 2013-10-01 NOTE — ED Notes (Signed)
Pt finished with MRI, EEG called and stated that they were ready for pt. Transporter to take pt from MRI to xray

## 2013-10-01 NOTE — ED Notes (Signed)
Family in room and states that pt seemed to have increased weakness in rt arm now

## 2013-10-01 NOTE — Consult Note (Addendum)
NEURO HOSPITALIST CONSULT NOTE    Referring Physician: Candiss Norse    Chief Complaint: AMS   Date last known well: Date: 09/30/2013 Time last known well: Time: 16:00 tPA Given: No: on coumadin and out of window    HPI:                                                                                                                                          Gary Reynolds is an 73 y.o. 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  He lives at a nursing facility and is wheel chair bound. At baseline he is able to talk and hold a conversation without significant difficulty.  Margorie John notes he usually is "10% slower in thought process than they are".  Yesterday at 1600 he was noted to suddenly start staring forward, then mouthing his words but unable to vocalize, this then resolved but he was much slower in though process and right arm was weaker than usual. He never fully resolved from this.  This AM he had another spell which he was staring forward and not responding.  In addition his speech has become more slurred than usual.  HE was brought to ED where CT head was negative and INR was found to be sub therapeutic at 1.8.    Past Medical History  Diagnosis Date  . Stroke   . Hypertension   . Coronary artery disease   . Atrial fibrillation    past surgical history  1)Insertion of a left intraventricular catheter for cerebrospinal fluid drain and control of intracranial pressure.   Family History  Problem Relation Age of Onset  . Hyperlipidemia Mother   . Hypertension Mother   . Hypertension Father      Social History:  reports that he has quit smoking. He does not have any smokeless tobacco history on file. He reports that he does not drink alcohol. His drug history is not on file.  Allergies  Allergen Reactions  . Codeine Other (See Comments)    Unknown reaction (per MAR)   . Food Other (See Comments)     Seafood - unknown reaction  (per MAR)  . Shellfish Allergy Other (See Comments)    Unknown reaction (per MAR)  . Sulfa Antibiotics Other (See Comments)    Unknown reaction (per MAR)  . Wellbutrin [Bupropion] Other (See Comments)    Unknown reaction (per [facility])    MEDICATIONS:  Current Facility-Administered Medications  Medication Dose Route Frequency Provider Last Rate Last Dose  . heparin ADULT infusion 100 units/mL (25000 units/250 mL)  1,000 Units/hr Intravenous Continuous Thurnell Lose, MD      . senna-docusate (Senokot-S) tablet 1 tablet  1 tablet Oral QHS PRN Thurnell Lose, MD      . warfarin (COUMADIN) tablet 4 mg  4 mg Oral ONCE-1800 Thurnell Lose, MD      . Warfarin - Pharmacist Dosing Inpatient   Does not apply Niles, MD       Current Outpatient Prescriptions  Medication Sig Dispense Refill  . acetaminophen (TYLENOL) 500 MG tablet Take 1,000 mg by mouth 3 (three) times daily.       Marland Kitchen acetaminophen (TYLENOL) 500 MG tablet Take 1,000 mg by mouth 2 (two) times daily as needed (for temp >101.0).      Marland Kitchen ALPRAZolam (XANAX) 0.5 MG tablet Take 0.5 mg by mouth daily as needed for anxiety.      Marland Kitchen atenolol (TENORMIN) 25 MG tablet Take 25 mg by mouth daily.      . bisacodyl (BISACODYL) 5 MG EC tablet Take 5 mg by mouth 2 (two) times daily as needed for moderate constipation.      . cholecalciferol (VITAMIN D) 1000 UNITS tablet Take 1,000 Units by mouth daily.      . cyclobenzaprine (FLEXERIL) 5 MG tablet Take 5 mg by mouth 2 (two) times daily as needed (back pain).      Marland Kitchen divalproex (DEPAKOTE SPRINKLE) 125 MG capsule Take 125 mg by mouth 2 (two) times daily.      Marland Kitchen docusate sodium (COLACE) 100 MG capsule Take 100 mg by mouth 2 (two) times daily.      . DULoxetine (CYMBALTA) 20 MG capsule Take 20 mg by mouth daily.      Marland Kitchen gabapentin (NEURONTIN) 100 MG  capsule Take 100 mg by mouth 3 (three) times daily.      . isosorbide mononitrate (IMDUR) 30 MG 24 hr tablet Take 30 mg by mouth daily.      . magnesium citrate SOLN Take 1 Bottle by mouth daily as needed for severe constipation. Use only for no BM in 24 hours      . methylphenidate (RITALIN) 20 MG tablet Take 20-40 mg by mouth See admin instructions. Take 40 mg by mouth in the morning, then 20 mg by mouth  in the afternoon (1400)      . nitroGLYCERIN (NITROSTAT) 0.4 MG SL tablet Place 0.4 mg under the tongue every 5 (five) minutes as needed for chest pain.      Marland Kitchen OLANZapine (ZYPREXA) 2.5 MG tablet Take 2.5 mg by mouth at bedtime.      Marland Kitchen omeprazole (PRILOSEC) 20 MG capsule Take 20 mg by mouth See admin instructions. Take 20 mg by mouth on Monday, Wednesday, Friday, and Saturday.      . polyethylene glycol (MIRALAX / GLYCOLAX) packet Take 17 g by mouth 2 (two) times daily.      . Pyrithione Zinc (DENOREX DAILY EX) Apply 1 application topically 2 (two) times a week. During showers      . senna (SENOKOT) 8.6 MG TABS tablet Take 2 tablets by mouth at bedtime as needed for mild constipation.      . simvastatin (ZOCOR) 20 MG tablet Take 20 mg by mouth every evening.      . traMADol (ULTRAM-ER) 100 MG 24 hr tablet Take 100 mg by mouth 3 (  three) times daily.       Marland Kitchen warfarin (COUMADIN) 2 MG tablet Take 2 mg by mouth See admin instructions. Take 2 mg by mouth on Monday, Wednesday, and Friday      . warfarin (COUMADIN) 3 MG tablet Take 3 mg by mouth See admin instructions. Take 3 mg by mouth on Tuesday, Thursday, Saturday, and Sunday          ROS:                                                                                                                                       History obtained from son  General ROS: negative for - chills, fatigue, fever, night sweats, weight gain or weight loss Psychological ROS: negative for - behavioral disorder, hallucinations, memory difficulties, mood swings or  suicidal ideation Ophthalmic ROS: negative for - blurry vision, double vision, eye pain or loss of vision ENT ROS: negative for - epistaxis, nasal discharge, oral lesions, sore throat, tinnitus or vertigo Allergy and Immunology ROS: negative for - hives or itchy/watery eyes Hematological and Lymphatic ROS: negative for - bleeding problems, bruising or swollen lymph nodes Endocrine ROS: negative for - galactorrhea, hair pattern changes, polydipsia/polyuria or temperature intolerance Respiratory ROS: negative for - cough, hemoptysis, shortness of breath or wheezing Cardiovascular ROS: negative for - chest pain, dyspnea on exertion, edema or irregular heartbeat Gastrointestinal ROS: negative for - abdominal pain, diarrhea, hematemesis, nausea/vomiting or stool incontinence Genito-Urinary ROS: negative for - dysuria, hematuria, incontinence or urinary frequency/urgency Musculoskeletal ROS: negative for - joint swelling or muscular weakness Neurological ROS: as noted in HPI Dermatological ROS: negative for rash and skin lesion changes   Blood pressure 146/92, pulse 72, temperature 98.3 F (36.8 C), temperature source Oral, resp. rate 14, SpO2 97.00%.   Neurologic Examination:                                                                                                      Mental Status: Alert, oriented to hospital and year but not month, thought process is slow.  Speech dysarthric without evidence of aphasia.  Able to follow 3 step commands without difficulty. Cranial Nerves: II: Discs flat bilaterally; Visual fields grossly normal, pupils equal, round, reactive to light and accommodation III,IV, VI: ptosis not present, extra-ocular motions intact bilaterally V,VII: smile asymmetric on the left, facial light touch sensation decreased on the left VIII: hearing normal bilaterally IX,X: gag reflex present XI: bilateral shoulder shrug XII: midline tongue extension without  atrophy or  fasciculations  Motor: Right : Upper extremity   5/5    Left:     Upper extremity   0/5  Lower extremity   5/5     Lower extremity   0/5 -able to wiggle toes Tone and bulk:normal tone throughout; no atrophy noted Sensory: Pinprick and light touch decreased on the left Deep Tendon Reflexes:  3+ throughout Plantars: Up going bilaterally Cerebellar: normal finger-to-nose on the right,  Unable to perform H-S on the right due to back pain Gait: not tested--non ambulatory at baseline.  CV: pulses palpable throughout    Lab Results: Basic Metabolic Panel:  Recent Labs Lab 10/01/13 0932  NA 141  K 4.4  CL 105  CO2 22  GLUCOSE 85  BUN 15  CREATININE 0.77  CALCIUM 9.5    Liver Function Tests:  Recent Labs Lab 10/01/13 0932  AST 21  ALT 17  ALKPHOS 70  BILITOT 0.3  PROT 7.2  ALBUMIN 3.5   No results found for this basename: LIPASE, AMYLASE,  in the last 168 hours  Recent Labs Lab 10/01/13 0932  AMMONIA 35    CBC:  Recent Labs Lab 10/01/13 0932  WBC 9.6  NEUTROABS 5.7  HGB 14.7  HCT 43.3  MCV 91.9  PLT 173    Cardiac Enzymes: No results found for this basename: CKTOTAL, CKMB, CKMBINDEX, TROPONINI,  in the last 168 hours  Lipid Panel: No results found for this basename: CHOL, TRIG, HDL, CHOLHDL, VLDL, LDLCALC,  in the last 168 hours  CBG: No results found for this basename: GLUCAP,  in the last 168 hours  Microbiology: Results for orders placed during the hospital encounter of 02/19/07  URINE CULTURE     Status: None   Collection Time    02/20/07  1:08 AM      Result Value Ref Range Status   Specimen Description URINE, CATHETERIZED   Final   Special Requests  CVA NO  IMMUNE:NORM  UT SYMPT:NEG   Final   Colony Count NO GROWTH   Final   Culture NO GROWTH   Final   Report Status 02/21/2007 FINAL   Final  URINE CULTURE     Status: None   Collection Time    03/12/07 11:32 AM      Result Value Ref Range Status   Specimen Description URINE, RANDOM    Final   Special Requests  IMMUNE:NORM  UT SYMPT:NEG   Final   Colony Count >=100,000 COLONIES/ML   Final   Culture PSEUDOMONAS AERUGINOSA   Final   Report Status 03/14/2007 FINAL   Final   Organism ID, Bacteria PSEUDOMONAS AERUGINOSA   Final  URINE CULTURE     Status: None   Collection Time    03/19/07  6:06 PM      Result Value Ref Range Status   Specimen Description URINE, CATHETERIZED   Final   Special Requests  Levaquin  IMMUNE:NORM  UT SYMPT:POS   Final   Colony Count NO GROWTH   Final   Culture NO GROWTH   Final   Report Status 03/21/2007 FINAL   Final    Coagulation Studies:  Recent Labs  10/01/13 0932  LABPROT 20.4*  INR 1.80*    Imaging: Dg Chest 2 View  10/01/2013   CLINICAL DATA:  Weakness.  EXAM: CHEST  2 VIEW  COMPARISON:  PA and lateral chest 02/20/2007.  FINDINGS: The lungs are clear. Heart size is normal. There is no pneumothorax or pleural  effusion. No focal bony abnormality.  IMPRESSION: No acute disease.   Electronically Signed   By: Inge Rise M.D.   On: 10/01/2013 10:28   Ct Head Wo Contrast  10/01/2013   CLINICAL DATA:  Weakness.  Lethargy.  EXAM: CT HEAD WITHOUT CONTRAST  TECHNIQUE: Contiguous axial images were obtained from the base of the skull through the vertex without intravenous contrast.  COMPARISON:  04/04/2007  FINDINGS: There is no acute intracranial hemorrhage, infarction, or mass lesion. There is an old partially calcified stroke in the right basal ganglia. There is extensive periventricular white matter lucency, worse on the right than the left, consistent with chronic ischemic changes. There is a small old right cerebellar infarct. There is diffuse atrophy with secondary slight ventricular dilatation.  No acute osseous abnormalities. Dense calcification in the distal vertebral arteries.  IMPRESSION: No acute intracranial abnormality. Atrophy with old strokes and chronic small vessel ischemic changes. Atrophy has slightly progressed since the  prior study.   Electronically Signed   By: Rozetta Nunnery M.D.   On: 10/01/2013 11:07    Etta Quill PA-C [facility] [phone]  10/01/2013, 1:51 PM  Patient seen and examined.  Clinical course and management discussed.  Necessary edits performed.  I agree with the above.  Assessment and plan of care developed and discussed below.    Assessment/Plan: 73 year old male presenting with development of episodes of starring and right arm weakness.  MRI of the brain performed and reviewed.   MRI shows an acute left corona radiata infarct.  Patient with multiple stroke risk factors including atrial fibrillation.  INR subtherapeutic.   With starring spells would like to rule out seizure as well.     Stroke Risk Factors - atrial fibrillation, hyperlipidemia and hypertension  Recommendations: 1. HgbA1c, fasting lipid panel 2. PT consult, OT consult, Speech consult 3. Echocardiogram 4. Carotid dopplers 5. Prophylactic therapy-Continue Coumadin with target INR of 2-3 7. Risk factor modification 8. Telemetry monitoring 9. Frequent neuro checks 10. EEG  Alexis Goodell, MD [facility] [phone]  10/01/2013  5:26 PM

## 2013-10-01 NOTE — ED Notes (Signed)
Returns to room from xray

## 2013-10-01 NOTE — Procedures (Signed)
ELECTROENCEPHALOGRAM REPORT   Patient: Gary Reynolds       Room #: 0B55 EEG No. ID: [id number] Age: 73 y.o.        Sex: male Referring Physician: Candiss Norse Report Date:  10/01/2013        Interpreting Physician: Alexis Goodell  History: [name] is an 73 y.o. male with new onset starring spells  Medications:  Scheduled: . warfarin  4 mg Oral ONCE-1800  . Warfarin - Pharmacist Dosing Inpatient   Does not apply q1800    Conditions of Recording:  This is a 16 channel EEG carried out with the patient in the awake and drowsy states.  Description:  The background activity is continuous and consists of a low voltage, poorly organized mixture of theta and delta frequencies.  Some rare alpha activity is noted but it is poorly sustained when noted.   There is no evidence of normal sleep.   No epileptiform activity is noted. Hyperventilation and intermittent photic stimulation were not performed.  IMPRESSION: This EEG is characterized by slowing which is consistent with normal drowse.  Can not rule out the possibility of slowing related to general cerebral disturbance such as a metabolic encephalopathy.  Clinical correlation recommended.  No epileptiform activity is noted.       Alexis Goodell, MD [facility] [phone] 10/01/2013, 6:52 PM

## 2013-10-01 NOTE — ED Notes (Signed)
`  pt is from [facility] and has been there since 08 for stroke that affected his left side w/ complete paralysis. Staff noticed yesterday that pt was slower to respond and not his normal self . Per ems staff at [facility] called son and he states that they neededto wait, this am same saff came on and states that pt is weaker than yesterday and decision was made to bring pt to er. Pt has some ronchi and productive cough

## 2013-10-01 NOTE — Progress Notes (Signed)
EEG Completed; Results Pending  

## 2013-10-01 NOTE — ED Notes (Signed)
Pt brief changed. In and out cath completed

## 2013-10-01 NOTE — Progress Notes (Signed)
1815 pt arrived on the unit via stretcher; pt Alert and verbally responsive; pt oriented to the unit; pt placed on telemetry; pt incontinent or urine, cleaned up and condom cath applied; pt left side flaccid (baseline); pt IV remain intact and infusing; Heparin drip continue to infuse; pt family in at bedside @1845  and family voices concern and questions about pt condition. Oncall MD called; NP Baltazar Najjar in to speak with pt family at 2020 to address family needs. Report given to incoming RN. Pt in bed with call light within reach and some family members at bedside. Francis Gaines Nesbit Michon RN.

## 2013-10-01 NOTE — ED Notes (Signed)
Per family pt had episode yesterday of" lost speech" yesterday where he could not speak and also c/o pain in left bicep pt is normally able to comunicate well tho left side cont to have def from stroke in 08

## 2013-10-01 NOTE — ED Provider Notes (Signed)
CSN: [id number]     Arrival date & time 10/01/13  3716 History   First MD Initiated Contact with Patient 10/01/13 [phone]     Chief Complaint  Patient presents with  . Weakness     (Consider location/radiation/quality/duration/timing/severity/associated sxs/prior Treatment) HPI 73 year old male presents from the nursing home with family with lethargy and altered mental status. 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 His had no palpitations of stroke since then. He is currently on Coumadin for atrial fibrillation. Yesterday he had a sudden onset of decreased responsiveness. This seemed to improve he went back to his mental baseline. He is normally able to tell where he is, his name, and family members but is usually disoriented. Has not had any fevers or chills. He started having a cough and "rattling in his chest" since yesterday after he was fed thick diet. This morning when the nursing home checked on him he was more lethargic and not like himself so sent to the ER. He has not had any focal seizure-like activity, fevers, or new weakness. He has been complaining of some left arm pain but at this time he is time he does not have any pain.  Past Medical History  Diagnosis Date  . Stroke   . Hypertension   . Coronary artery disease   . Atrial fibrillation    No past surgical history on file. No family history on file. History  Substance Use Topics  . Smoking status: Former Research scientist (life sciences)  . Smokeless tobacco: Not on file  . Alcohol Use: No    Review of Systems  Unable to perform ROS: Dementia      Allergies  Codeine; Food; Shellfish allergy; Sulfa antibiotics; and Wellbutrin  Home Medications   Prior to Admission medications   Not on File   BP 138/82  Pulse 55  Temp(Src) 98.3 F (36.8 C) (Oral)  Resp 15  SpO2 99% Physical Exam  Nursing note and vitals reviewed. Constitutional: He appears well-developed and well-nourished.   HENT:  Head: Normocephalic and atraumatic.  Right Ear: External ear normal.  Left Ear: External ear normal.  Nose: Nose normal.  Eyes: EOM are normal. Pupils are equal, round, and reactive to light. Right eye exhibits no discharge. Left eye exhibits no discharge.  Neck: Neck supple.  Cardiovascular: Normal rate, regular rhythm, normal heart sounds and intact distal pulses.   Pulmonary/Chest: Effort normal and breath sounds normal.  Abdominal: Soft. He exhibits no distension. There is no tenderness.  Musculoskeletal: He exhibits no edema.  Neurological: He is alert. He is disoriented. GCS eye subscore is 4. GCS verbal subscore is 4. GCS motor subscore is 6.  Oriented to place and self. Slow to respond but otherwise answers questions normally. Left side of body if flaccid, including left facial droop (old). No weakness on right side of body.  Skin: Skin is warm and dry.    ED Course  Procedures (including critical care time) Labs Review Labs Reviewed  COMPREHENSIVE METABOLIC PANEL - Abnormal; Notable for the following:    GFR calc non Af Amer 89 (*)    All other components within normal limits  URINALYSIS, ROUTINE W REFLEX MICROSCOPIC - Abnormal; Notable for the following:    Hgb urine dipstick TRACE (*)    All other components within normal limits  PROTIME-INR - Abnormal; Notable for the following:    Prothrombin Time 20.4 (*)    INR 1.80 (*)  All other components within normal limits  CBC WITH DIFFERENTIAL  AMMONIA  URINE MICROSCOPIC-ADD ON  TROPONIN I  HEPARIN LEVEL (UNFRACTIONATED)  TROPONIN I  TROPONIN I  HEPARIN LEVEL (UNFRACTIONATED)  CBC  HEMOGLOBIN A1C  LIPID PANEL  I-STAT TROPOININ, ED    Imaging Review Dg Chest 2 View  10/01/2013   CLINICAL DATA:  Weakness.  EXAM: CHEST  2 VIEW  COMPARISON:  PA and lateral chest 02/20/2007.  FINDINGS: The lungs are clear. Heart size is normal. There is no pneumothorax or pleural effusion. No focal bony abnormality.   IMPRESSION: No acute disease.   Electronically Signed   By: Inge Rise M.D.   On: 10/01/2013 10:28   Ct Head Wo Contrast  10/01/2013   CLINICAL DATA:  Weakness.  Lethargy.  EXAM: CT HEAD WITHOUT CONTRAST  TECHNIQUE: Contiguous axial images were obtained from the base of the skull through the vertex without intravenous contrast.  COMPARISON:  04/04/2007  FINDINGS: There is no acute intracranial hemorrhage, infarction, or mass lesion. There is an old partially calcified stroke in the right basal ganglia. There is extensive periventricular white matter lucency, worse on the right than the left, consistent with chronic ischemic changes. There is a small old right cerebellar infarct. There is diffuse atrophy with secondary slight ventricular dilatation.  No acute osseous abnormalities. Dense calcification in the distal vertebral arteries.  IMPRESSION: No acute intracranial abnormality. Atrophy with old strokes and chronic small vessel ischemic changes. Atrophy has slightly progressed since the prior study.   Electronically Signed   By: Rozetta Nunnery M.D.   On: 10/01/2013 11:07   Dg Humerus Left  10/01/2013   CLINICAL DATA:  Left arm pain.  EXAM: LEFT HUMERUS - 2+ VIEW  COMPARISON:  None.  FINDINGS: Bones are osteopenic. No fracture or focal suspicious bony abnormality is identified. No discrete soft tissue swelling. There are degenerative changes of the acromioclavicular joint.  IMPRESSION: Decreased bony mineralization.  No acute bony abnormality.   Electronically Signed   By: Curlene Dolphin M.D.   On: 10/01/2013 14:41     EKG Interpretation   Date/Time:  Thursday Oct 01 2013 08:52:32 EDT Ventricular Rate:  57 PR Interval:    QRS Duration: 105 QT Interval:  423 QTC Calculation: 412 R Axis:   26 Text Interpretation:  Atrial fibrillation Anterior infarct, old Baseline  wander in lead(s) V2 No significant change since last tracing Confirmed by  [place] ([id number]) on 10/01/2013 10:20:05 AM       MDM   Clinical Impression: Altered Mental Status  Patient with waxing and waning mental status. Concern for seizure vs TIA vs CVA. Hemodynamically stable here. CT head, labs and CXR benign. Neuro consulted, will need admission to medicine for EEG and MRI to rule out seizure or CVA. Stable for floor admission    Ephraim Hamburger, MD 10/01/13 1712

## 2013-10-02 ENCOUNTER — Inpatient Hospital Stay (HOSPITAL_COMMUNITY): Payer: Medicare Other

## 2013-10-02 DIAGNOSIS — I517 Cardiomegaly: Secondary | ICD-10-CM

## 2013-10-02 DIAGNOSIS — I6350 Cerebral infarction due to unspecified occlusion or stenosis of unspecified cerebral artery: Secondary | ICD-10-CM

## 2013-10-02 LAB — LIPID PANEL
Cholesterol: 152 mg/dL (ref 0–200)
HDL: 51 mg/dL (ref >39)
LDL Cholesterol: 86 mg/dL (ref 0–99)
Total CHOL/HDL Ratio: 3 RATIO
Triglycerides: 76 mg/dL (ref <150)
VLDL: 15 mg/dL (ref 0–40)

## 2013-10-02 LAB — CBC
HCT: 44.9 % (ref 39.0–52.0)
Hemoglobin: 15.1 g/dL (ref 13.0–17.0)
MCH: 30.8 pg (ref 26.0–34.0)
MCHC: 33.6 g/dL (ref 30.0–36.0)
MCV: 91.4 fL (ref 78.0–100.0)
Platelets: 211 10*3/uL (ref 150–400)
RBC: 4.91 MIL/uL (ref 4.22–5.81)
RDW: 13.2 % (ref 11.5–15.5)
WBC: 9.3 10*3/uL (ref 4.0–10.5)

## 2013-10-02 LAB — HEPARIN LEVEL (UNFRACTIONATED)
HEPARIN UNFRACTIONATED: 0.42 [IU]/mL (ref 0.30–0.70)
HEPARIN UNFRACTIONATED: 0.6 [IU]/mL (ref 0.30–0.70)

## 2013-10-02 LAB — HEMOGLOBIN A1C
HEMOGLOBIN A1C: 5.5 % (ref <5.7)
Mean Plasma Glucose: 111 mg/dL (ref <117)

## 2013-10-02 LAB — PROTIME-INR
INR: 1.82 — ABNORMAL HIGH (ref 0.00–1.49)
PROTHROMBIN TIME: 20.5 s — AB (ref 11.6–15.2)

## 2013-10-02 LAB — TROPONIN I

## 2013-10-02 MED ORDER — CIPROFLOXACIN HCL 0.3 % OP SOLN
2.0000 [drp] | OPHTHALMIC | Status: DC
  Administered 2013-10-02 – 2013-10-12 (×49): 2 [drp] via OPHTHALMIC
  Filled 2013-10-02 (×3): qty 2.5

## 2013-10-02 MED ORDER — MORPHINE SULFATE 2 MG/ML IJ SOLN
1.0000 mg | INTRAMUSCULAR | Status: DC | PRN
  Administered 2013-10-02: 1 mg via INTRAVENOUS
  Filled 2013-10-02: qty 1

## 2013-10-02 MED ORDER — WARFARIN SODIUM 5 MG PO TABS
5.0000 mg | ORAL_TABLET | Freq: Once | ORAL | Status: AC
  Filled 2013-10-02: qty 1

## 2013-10-02 MED ORDER — SODIUM CHLORIDE 0.9 % IV SOLN
INTRAVENOUS | Status: DC
  Administered 2013-10-02: 20:00:00 via INTRAVENOUS

## 2013-10-02 NOTE — Progress Notes (Signed)
Echo Lab  2D Echocardiogram completed.  [place], RDCS 10/02/2013 9:57 AM

## 2013-10-02 NOTE — Evaluation (Signed)
Clinical/Bedside Swallow Evaluation Patient Details  Name: Gary Reynolds MRN: [id number] Date of Birth: January 10, 1941  Today's Date: 10/02/2013 Time: 1040-1105 SLP Time Calculation (min): 25 min  Past Medical History:  Past Medical History  Diagnosis Date  . Stroke   . Hypertension   . Coronary artery disease   . Atrial fibrillation   . GERD 11/25/2006    Qualifier: Diagnosis of  By: Leanne Chang MD, Bruce    . HYPERLIPIDEMIA 11/25/2006    Qualifier: Diagnosis of  By: Leanne Chang MD, Bruce     Past Surgical History: No past surgical history on file. HPI:  73 y.o. 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 Chest x-ray, UA, head CT, EKG were unremarkable. MRI acute nonhemorrhagic infarct involving the left coronal radiata, remote hemorrhagic infarcts of the right basal ganglia and frontal operculum.   Assessment / Plan / Recommendation Clinical Impression  Pt. exhibited indications of oral and phayrngeal impairments with po's trialed.  In addition, pt.'s cognitive deficits increase aspiration risk, therefore, SLP recommending NPO with MBS today at 1330 to objectively assess.    Aspiration Risk  Severe    Diet Recommendation NPO except meds        Other  Recommendations Recommended Consults: MBS Oral Care Recommendations: Oral care BID   Follow Up Recommendations  Skilled Nursing facility    Frequency and Duration        Pertinent Vitals/Pain WDL         Swallow Study         Oral/Motor/Sensory Function Overall Oral Motor/Sensory Function: Impaired Labial ROM: Reduced left (prior CVA) Labial Symmetry: Abnormal symmetry left Labial Strength: Reduced Lingual ROM: Within Functional Limits Lingual Symmetry: Within Functional Limits Lingual Strength: Reduced Facial ROM: Reduced left Velum: Within Functional Limits Mandible:  Within Functional Limits   Ice Chips Ice chips: Impaired Presentation: Spoon Oral Phase Impairments: Reduced lingual movement/coordination Pharyngeal Phase Impairments: Suspected delayed Swallow;Decreased hyoid-laryngeal movement   Thin Liquid Thin Liquid: Impaired Oral Phase Impairments: Reduced lingual movement/coordination Pharyngeal  Phase Impairments: Decreased hyoid-laryngeal movement;Suspected delayed Swallow;Cough - Immediate    Nectar Thick Nectar Thick Liquid: Not tested   Honey Thick Honey Thick Liquid: Not tested   Puree Puree: Impaired Presentation: Spoon Pharyngeal Phase Impairments: Suspected delayed Swallow   Solid   GO    Solid: Not tested       Houston Siren M.Ed [employer] [phone]  10/02/2013

## 2013-10-02 NOTE — Progress Notes (Addendum)
[place] for heparin Indication: CVA  Allergies  Allergen Reactions  . Codeine Other (See Comments)    Unknown reaction (per MAR)   . Food Other (See Comments)    Seafood - unknown reaction  (per MAR)  . Shellfish Allergy Other (See Comments)    Unknown reaction (per MAR)  . Sulfa Antibiotics Other (See Comments)    Unknown reaction (per MAR)  . Wellbutrin [Bupropion] Other (See Comments)    Unknown reaction (per [facility])    Patient Measurements: Height: 6\' 4"  (193 cm) Weight: 216 lb 14.9 oz (98.4 kg) IBW/kg (Calculated) : 86.8 Heparin Dosing Weight: NA  Vital Signs: Temp: 97.9 F (36.6 C) (05/08 0500) Temp src: Oral (05/08 0500) BP: 154/96 mmHg (05/08 0500) Pulse Rate: 85 (05/08 0500)  Labs:  Recent Labs  10/01/13 0932 10/01/13 1332 10/01/13 2052 10/02/13 0148 10/02/13 0510 10/02/13 0737 10/02/13 1125  HGB 14.7  --   --   --  15.1  --   --   HCT 43.3  --   --   --  44.9  --   --   PLT 173  --   --   --  211  --   --   LABPROT 20.4*  --   --   --   --  20.5*  --   INR 1.80*  --   --   --   --  1.82*  --   HEPARINUNFRC  --   --  0.14*  --  0.42  --  <0.10*  CREATININE 0.77  --   --   --   --   --   --   TROPONINI  --  <0.30 <0.30 <0.30  --   --   --     Medical History: Past Medical History  Diagnosis Date  . Stroke   . Hypertension   . Coronary artery disease   . Atrial fibrillation   . GERD 11/25/2006    Qualifier: Diagnosis of  By: Leanne Chang MD, Bruce    . HYPERLIPIDEMIA 11/25/2006    Qualifier: Diagnosis of  By: Leanne Chang MD, Bruce      Medications:  Prescriptions prior to admission  Medication Sig Dispense Refill  . acetaminophen (TYLENOL) 500 MG tablet Take 1,000 mg by mouth 3 (three) times daily.       Marland Kitchen acetaminophen (TYLENOL) 500 MG tablet Take 1,000 mg by mouth 2 (two) times daily as needed (for temp >101.0).      Marland Kitchen ALPRAZolam (XANAX) 0.5 MG tablet Take 0.5 mg by mouth daily as needed for anxiety.      Marland Kitchen  atenolol (TENORMIN) 25 MG tablet Take 25 mg by mouth daily.      . bisacodyl (BISACODYL) 5 MG EC tablet Take 5 mg by mouth 2 (two) times daily as needed for moderate constipation.      . cholecalciferol (VITAMIN D) 1000 UNITS tablet Take 1,000 Units by mouth daily.      . cyclobenzaprine (FLEXERIL) 5 MG tablet Take 5 mg by mouth 2 (two) times daily as needed (back pain).      Marland Kitchen divalproex (DEPAKOTE SPRINKLE) 125 MG capsule Take 125 mg by mouth 2 (two) times daily.      Marland Kitchen docusate sodium (COLACE) 100 MG capsule Take 100 mg by mouth 2 (two) times daily.      . DULoxetine (CYMBALTA) 20 MG capsule Take 20 mg by mouth daily.      Marland Kitchen gabapentin (  NEURONTIN) 100 MG capsule Take 100 mg by mouth 3 (three) times daily.      . isosorbide mononitrate (IMDUR) 30 MG 24 hr tablet Take 30 mg by mouth daily.      . magnesium citrate SOLN Take 1 Bottle by mouth daily as needed for severe constipation. Use only for no BM in 24 hours      . methylphenidate (RITALIN) 20 MG tablet Take 20-40 mg by mouth See admin instructions. Take 40 mg by mouth in the morning, then 20 mg by mouth  in the afternoon (1400)      . nitroGLYCERIN (NITROSTAT) 0.4 MG SL tablet Place 0.4 mg under the tongue every 5 (five) minutes as needed for chest pain.      Marland Kitchen OLANZapine (ZYPREXA) 2.5 MG tablet Take 2.5 mg by mouth at bedtime.      Marland Kitchen omeprazole (PRILOSEC) 20 MG capsule Take 20 mg by mouth See admin instructions. Take 20 mg by mouth on Monday, Wednesday, Friday, and Saturday.      . polyethylene glycol (MIRALAX / GLYCOLAX) packet Take 17 g by mouth 2 (two) times daily.      . Pyrithione Zinc (DENOREX DAILY EX) Apply 1 application topically 2 (two) times a week. During showers      . senna (SENOKOT) 8.6 MG TABS tablet Take 2 tablets by mouth at bedtime as needed for mild constipation.      . simvastatin (ZOCOR) 20 MG tablet Take 20 mg by mouth every evening.      . traMADol (ULTRAM-ER) 100 MG 24 hr tablet Take 100 mg by mouth 3 (three) times  daily.       Marland Kitchen warfarin (COUMADIN) 2 MG tablet Take 2 mg by mouth See admin instructions. Take 2 mg by mouth on Monday, Wednesday, and Friday      . warfarin (COUMADIN) 3 MG tablet Take 3 mg by mouth See admin instructions. Take 3 mg by mouth on Tuesday, Thursday, Saturday, and Sunday        Assessment: 73 year old male on heparin drip for new TIA vs stroke.  Heparin level this morning returned as undetectable. Verified that there has not been any problems with the line or infusion.  CBC remains stable.  Pt remains without s/sx bleeding.  INR noted to be subtherapeutic on admission, INR low today at 1.8.  PTA dose: 2 mg po Mon/Wed/Fri, 3 mg po on all other days.  Goal of Therapy:  Heparin level 0.3-0.5 Monitor platelets by anticoagulation protocol: Yes   Plan:  Increase heparin gtt to 1450 units/hr Next heparin level at 2000 Daily HL, CBC, INR Monitor closely for s/sx bleeding Warfarin 5 mg po x1  Hughes Better, PharmD, BCPS Clinical Pharmacist Pager: [phone] 10/02/2013 1:29 PM

## 2013-10-02 NOTE — Evaluation (Signed)
Physical Therapy Evaluation Patient Details Name: Gary Reynolds MRN: [id number] DOB: 08-Jul-1940 Today's Date: 10/02/2013   History of Present Illness  73 y.o. 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 MRI revealed acute ischemic left cornea radiata CVA.  Clinical Impression  Pt adm due to the above. Presents with overall decreased independence with bed mobility and decreased strength. Pt to benefit from skilled acute PT to address deficits and maximize mobility while preventing contractures and pressure ulcers. Pt from SNF per chart and RN. Will require total care upon acute D/C.     Follow Up Recommendations SNF;Supervision/Assistance - 24 hour    Equipment Recommendations  None recommended by PT    Recommendations for Other Services       Precautions / Restrictions Precautions Precautions: Fall Precaution Comments: Lt side hemiplegia  Restrictions Weight Bearing Restrictions: No      Mobility  Bed Mobility Overal bed mobility: +2 for physical assistance;Needs Assistance Bed Mobility: Sit to Supine;Supine to Sit     Supine to sit: Max assist;+2 for physical assistance;HOB elevated Sit to supine: Max assist;+2 for physical assistance   General bed mobility comments: use of helicopter technique with draw pad to pivot hips around and bring trunk to sitting positon; pt minimally able to abduct Rt LE towards EOB;max cues for sequencing; pt able to reach for handrail with Rt UE but difficulty (A) due to weakness; total (A) to maintain sitting    Transfers                 General transfer comment: will  require lift at this time   Ambulation/Gait                Stairs            Wheelchair Mobility    Modified Rankin (Stroke Patients Only) Modified Rankin (Stroke  Patients Only) Pre-Morbid Rankin Score: Severe disability Modified Rankin: Severe disability     Balance Overall balance assessment: Needs assistance Sitting-balance support: Feet supported;Single extremity supported Sitting balance-Leahy Scale: Zero Sitting balance - Comments: requires total (A) to maintain EOB; unable to engage abdominmals and lean anteriorly with cues; tolerated sitting EOB ~5 min with total posterior (A)  Postural control: Posterior lean                                   Pertinent Vitals/Pain C/o LBP    Home Living Family/patient expects to be discharged to:: Skilled nursing facility                 Additional Comments: Pt from SNF; per RN from report pt was total (A) and wheelchair bound; pt was receieving therapy and family wishes for continued therapy     Prior Function Level of Independence: Needs assistance   Gait / Transfers Assistance Needed: per pt pt was transferred to wheelchair with hoyer lift; reports he could perform wheelchair mobility independently with LEs   ADL's / Homemaking Assistance Needed: total (A) per chart and RN   Comments: no family present; pt was able to give minimal PLOF      Hand Dominance        Extremity/Trunk Assessment   Upper Extremity Assessment: LUE deficits/detail;Defer to OT evaluation (Lt UE contracture )  LUE Deficits / Details: Lt sided hemiplegia    Lower Extremity Assessment: RLE deficits/detail;LLE deficits/detail RLE Deficits / Details: difficulty to assess due to cognition; was able to abduct minimally; 2/5 in quads  LLE Deficits / Details: Lt sided hemiplegia   Cervical / Trunk Assessment: Kyphotic  Communication   Communication: Expressive difficulties  Cognition Arousal/Alertness: Awake/alert Behavior During Therapy: Flat affect Overall Cognitive Status: No family/caregiver present to determine baseline cognitive functioning Area of Impairment: Orientation;Following  commands;Safety/judgement;Awareness;Problem solving Orientation Level: Disoriented to;Situation;Time   Memory: Decreased short-term memory Following Commands: Follows one step commands inconsistently Safety/Judgement: Decreased awareness of deficits;Decreased awareness of safety Awareness: Intellectual Problem Solving: Slow processing;Decreased initiation;Difficulty sequencing;Requires verbal cues;Requires tactile cues      General Comments General comments (skin integrity, edema, etc.): pt repositioned in bed with Lt UE and LE elevated with heels floating to prevent pressure sores; RN notified pt could benefit from touch call bell     Exercises Low Level/ICU Exercises Ankle Circles/Pumps: PROM;Both;10 reps;Supine Heel Slides: PROM;Both;5 reps;Seated (limited in Lt LE due to tone)      Assessment/Plan    PT Assessment Patient needs continued PT services  PT Diagnosis Difficulty walking;Generalized weakness   PT Problem List Decreased strength;Decreased range of motion;Decreased activity tolerance;Decreased balance;Decreased mobility;Decreased cognition;Decreased safety awareness;Decreased knowledge of use of DME;Impaired sensation;Impaired tone  PT Treatment Interventions DME instruction;Functional mobility training;Therapeutic activities;Therapeutic exercise;Balance training;Neuromuscular re-education;Patient/family education   PT Goals (Current goals can be found in the Care Plan section) Acute Rehab PT Goals Patient Stated Goal: none stated PT Goal Formulation: With patient Time For Goal Achievement: 10/09/13 Potential to Achieve Goals: Fair    Frequency Min 2X/week   Barriers to discharge        Co-evaluation               End of Session   Activity Tolerance: Patient limited by fatigue Patient left: in bed;with call bell/phone within reach;with bed alarm set Nurse Communication: Mobility status;Other (comment) (soft touch call bell needed)         Time:  4403-4742 PT Time Calculation (min): 15 min   Charges:   PT Evaluation $Initial PT Evaluation Tier I: 1 Procedure PT Treatments $Therapeutic Activity: 8-22 mins   PT G CodesKennis Carina [place], [place]  [phone] 10/02/2013, 10:49 AM

## 2013-10-02 NOTE — Progress Notes (Signed)
ANTICOAGULATION CONSULT NOTE - Follow Up Consult  Pharmacy Consult for heparin Indication: stroke  Labs:  Recent Labs  10/01/13 0932 10/01/13 1332 10/01/13 2052 10/02/13 0148 10/02/13 0510  HGB 14.7  --   --   --  15.1  HCT 43.3  --   --   --  44.9  PLT 173  --   --   --  211  LABPROT 20.4*  --   --   --   --   INR 1.80*  --   --   --   --   HEPARINUNFRC  --   --  0.14*  --  0.42  CREATININE 0.77  --   --   --   --   TROPONINI  --  <0.30 <0.30 <0.30  --     Assessment/Plan:  73yo male now therapeutic on heparin after rate increase.  Will continue gtt at current rate and confirm stable with additional level.  Wynona Neat, PharmD, BCPS  10/02/2013,6:12 AM

## 2013-10-02 NOTE — Procedures (Signed)
Objective Swallowing Evaluation: Modified Barium Swallowing Study  Patient Details  Name: Gary Reynolds MRN: [id number] Date of Birth: 09-01-1940  Today's Date: 10/02/2013 Time: 5974-1638 SLP Time Calculation (min): 25 min  Past Medical History:  Past Medical History  Diagnosis Date  . Stroke   . Hypertension   . Coronary artery disease   . Atrial fibrillation   . GERD 11/25/2006    Qualifier: Diagnosis of  By: Leanne Chang MD, Bruce    . HYPERLIPIDEMIA 11/25/2006    Qualifier: Diagnosis of  By: Leanne Chang MD, Bruce     Past Surgical History: No past surgical history on file. HPI:  73 y.o. 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 Chest x-ray, UA, head CT, EKG were unremarkable. MRI acute nonhemorrhagic infarct involving the left coronal radiata, remote hemorrhagic infarcts of the right basal ganglia and frontal operculum.  BSE completed with recommedation for MBS.     Assessment / Plan / Recommendation Clinical Impression  Dysphagia Diagnosis: Moderate oral phase dysphagia;Moderate pharyngeal phase dysphagia;Severe pharyngeal phase dysphagia Clinical impression: Pt. displayed moderate oral and moderat-severe pharyngeal sensorimotor dysphagia.  Oral holding and transit delays requiring max verbal/visual cues to propel boluses.  Decreased pharyngeal sensation and laryngeal elevation leading to silent aspiration with honey consistency and mod vallecular and max pyriform sinus residue.  Pt. demonstrated decreased awareness and sensation and required max cues to initate second swallow with little success to reduce residue.  Aspiration risk with any consistency is high at present, therefore recommend continue NPO with continued ST for strengthening exercises as pt. able.     Treatment Recommendation  Therapy as outlined in treatment plan below     Diet Recommendation NPO        Other  Recommendations Recommended Consults: MBS Oral Care Recommendations: Oral care BID   Follow Up Recommendations  Skilled Nursing facility    Frequency and Duration min 2x/week  2 weeks   Pertinent Vitals/Pain             Reason for Referral Objectively evaluate swallowing function   Oral Phase Oral Preparation/Oral Phase Oral Phase: Impaired Oral - Honey Oral - Honey Teaspoon: Delayed oral transit;Holding of bolus;Weak lingual manipulation Oral - Solids Oral - Puree: Delayed oral transit;Holding of bolus;Weak lingual manipulation   Pharyngeal Phase Pharyngeal Phase Pharyngeal Phase: Impaired Pharyngeal - Honey Pharyngeal - Honey Teaspoon: Delayed swallow initiation;Premature spillage to valleculae;Penetration/Aspiration during swallow;Pharyngeal residue - pyriform sinuses;Pharyngeal residue - valleculae;Reduced tongue base retraction;Reduced laryngeal elevation Penetration/Aspiration details (honey teaspoon): Material enters airway, passes BELOW cords without attempt by patient to eject out (silent aspiration) Pharyngeal - Solids Pharyngeal - Puree: Delayed swallow initiation;Premature spillage to valleculae;Pharyngeal residue - valleculae;Pharyngeal residue - pyriform sinuses;Reduced tongue base retraction;Reduced laryngeal elevation  Cervical Esophageal Phase    GO    Cervical Esophageal Phase Cervical Esophageal Phase: [facility]         [place] Mon[employer] [phone]  10/02/2013

## 2013-10-02 NOTE — Progress Notes (Signed)
*  PRELIMINARY RESULTS* Vascular Ultrasound Carotid Duplex (Doppler) has been completed.  Preliminary findings: Bilateral:  1-39% ICA stenosis.  Vertebral artery flow is antegrade.      Landry Mellow, RDMS, RVT  10/02/2013, 10:00 AM

## 2013-10-02 NOTE — Evaluation (Signed)
Speech Language Pathology Evaluation Patient Details Name: Gary Reynolds MRN: [id number] DOB: [birth year] Today's Date: 10/02/2013 Time: 0254-2706 SLP Time Calculation (min): 10 min  Problem List:  Patient Active Problem List   Diagnosis Date Noted  . CVA (cerebral infarction) 10/01/2013  . Altered mental status 10/01/2013  . Mental status, decreased 10/01/2013  . HYPERLIPIDEMIA 11/25/2006  . HYPERTENSION 11/25/2006  . Atrial fibrillation 11/25/2006  . GERD 11/25/2006  . NEPHROLITHIASIS, HX OF 11/25/2006   Past Medical History:  Past Medical History  Diagnosis Date  . Stroke   . Hypertension   . Coronary artery disease   . Atrial fibrillation   . GERD 11/25/2006    Qualifier: Diagnosis of  By: Leanne Chang MD, Bruce    . HYPERLIPIDEMIA 11/25/2006    Qualifier: Diagnosis of  By: Leanne Chang MD, Bruce     Past Surgical History: No past surgical history on file. HPI:  73 y.o. 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 Chest x-ray, UA, head CT, EKG were unremarkable. MRI acute nonhemorrhagic infarct involving the left coronal radiata, remote hemorrhagic infarcts of the right basal ganglia and frontal operculum.  BSE completed with recommedation for MBS.   Assessment / Plan / Recommendation Clinical Impression  Pt. has history of cognitive deficits following previous stroke.  Currently he demonstrates moderate-severe cognitive impairments with sustained attention, decreased intiation, delayed processing, problem solving, awareness, memory.  Moderate dysarthria with imprecise phonemes. SLP will determine prior cognitive and speech functions prior to this admit with family.  ST will see for facilitation of speech-cognition and swallow function.     SLP Assessment  Patient needs continued Speech Lanaguage Pathology Services    Follow Up  Recommendations  Skilled Nursing facility    Frequency and Duration min 2x/week  2 weeks   Pertinent Vitals/Pain WDL   SLP Goals  SLP Goals Potential to Achieve Goals: Fair Potential Considerations: Ability to learn/carryover information;Co-morbidities;Previous level of function Progress/Goals/Alternative treatment plan discussed with pt/caregiver and they: Agree  SLP Evaluation Prior Functioning  Cognitive/Linguistic Baseline: Baseline deficits Baseline deficit details:  (processing,memory, dysarthria) Type of Home: [place]   Cognition  Arousal/Alertness: Awake/alert Orientation Level: Disoriented to time;Oriented to person;Oriented to place;Disoriented to situation Attention: Sustained Sustained Attention: Impaired Sustained Attention Impairment: Verbal basic;Functional basic Memory: Impaired Memory Impairment: Decreased recall of new information;Decreased short term memory;Prospective memory Decreased Short Term Memory: Verbal basic Awareness: Impaired Awareness Impairment: Intellectual impairment;Emergent impairment;Anticipatory impairment Problem Solving: Impaired Problem Solving Impairment: Verbal basic;Functional basic Safety/Judgment: Impaired    Comprehension  Auditory Comprehension Overall Auditory Comprehension: Appears within functional limits for tasks assessed (will assess for more complex info) Commands: Within Functional Limits (1/1 two step command) Conversation:  (delayed processing and responses) Interfering Components: Processing speed;Working Field seismologist: Tour manager: Not tested Reading Comprehension Reading Status: Not tested    Expression Expression Primary Mode of Expression: Verbal Verbal Expression Overall Verbal Expression: Impaired Initiation: Impaired Level of Generative/Spontaneous Verbalization: Sentence Repetition:  (NT) Naming: No  impairment Pragmatics: Impairment Impairments: Eye contact;Monotone Interfering Components: Attention Written Expression Dominant Hand:  (will determine) Written Expression:  (TBA)   Oral / Motor Oral Motor/Sensory Function Overall Oral Motor/Sensory Function: Impaired Labial ROM: Reduced left Labial Symmetry: Abnormal symmetry left Labial Strength: Reduced Lingual ROM: Within Functional Limits Lingual Symmetry: Within Functional Limits Lingual Strength: Reduced Facial ROM: Reduced left Velum: Within Functional Limits  Mandible: Within Functional Limits Motor Speech Overall Motor Speech: Impaired Respiration: Impaired Level of Impairment: Conversation Phonation: Low vocal intensity;Wet Resonance: Within functional limits Articulation: Within functional limitis Intelligibility: Intelligibility reduced Word: 75-100% accurate Phrase: 75-100% accurate Sentence: 75-100% accurate Conversation: 50-74% accurate Motor Planning: Witnin functional limits   GO     Orbie Pyo [employer] [phone]  10/02/2013

## 2013-10-02 NOTE — Evaluation (Signed)
Occupational Therapy Evaluation Patient Details Name: Gary Reynolds MRN: [id number] DOB: 12/31/1940 Today's Date: 10/02/2013    History of Present Illness 73 y.o. 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 MRI revealed acute ischemic left cornea radiata CVA.   Clinical Impression   Pt admitted with above. He demonstrates the below listed deficits and will benefit from continued OT to maximize safety and independence with BADLs.  Pt's son present during OT eval.  Pt has been a resident at [facility] since 2008, and has not received therapy since 2008/2009.  He required hoyer lift for transfers to w/c, but was unable to propel his wheelchair.  He was total A for all self care activities except self feeding and possibly simple grooming (son is unsure).  Lt. UE is at baseline, as is cognition per son's report.  Currently, pt demonstrates new weakness Rt. UE that impedes his ability to engage in activities such as talking on a phone, self feeding if that becomse an option, and simple grooming.  Pt's son is very eager for him to receive aggressive therapies. I have explained to him that OT will see pt 2x/wk while in hospital and the recommendation is that he will return to SNF where he will receive further OT to work on [place]. UE function and increasing independence with grooming.      Follow Up Recommendations  SNF    Equipment Recommendations  None recommended by OT    Recommendations for Other Services       Precautions / Restrictions Precautions Precautions: Fall Precaution Comments: Lt side hemiplegia  Restrictions Weight Bearing Restrictions: No      Mobility Bed Mobility                  Transfers                      Balance                                             ADL Overall ADL's : Needs assistance/impaired Eating/Feeding: NPO Eating/Feeding Details (indicate cue type and reason): Pt does not demonstrate ability to self feed as he is unable to hold onto a utensil  Grooming: Wash/dry hands;Wash/dry face;Oral care;Brushing hair;Total assistance;Bed level Grooming Details (indicate cue type and reason): Pt unable to hold onto or manipulate grooming utensils Upper Body Bathing: Total assistance;Bed level   Lower Body Bathing: Total assistance;Bed level   Upper Body Dressing : Total assistance;Bed level   Lower Body Dressing: Total assistance;Bed level   Toilet Transfer: Total assistance (unable)   Toileting- Clothing Manipulation and Hygiene: Total assistance;Bed level               Vision                 Additional Comments: Pt unable to participate in visual assessment   Perception     Praxis      Pertinent Vitals/Pain Pt with no complaint of pain     Hand Dominance  (will determine)   Extremity/Trunk Assessment Upper Extremity Assessment Upper Extremity Assessment: RUE deficits/detail;LUE deficits/detail RUE Deficits / Details: Pt with strength grossly 3-/5 UE and 2/5 hand.  He is unable to hold object in his hand and does not have strength to use soft pad call bell.  He is able to reach to ~50* shoulder elevation.  PROM WFL RUE Coordination: decreased fine motor;decreased gross motor LUE Deficits / Details: Lt sided hemiplegia - pt with moderate flexor spasticity throughout.  Son reports this is baseline.  Unable to fully extend digits, but fingers do not appear to dig into palm.  He demonstrates no active movement of Lt. UE.  PROM shoulder ~80*; elbow WFL; forearm ~50%; wrist ~50% LUE Coordination: decreased fine motor;decreased gross motor   Lower Extremity Assessment Lower Extremity Assessment: Defer to PT evaluation   Cervical / Trunk Assessment Cervical / Trunk Assessment: Kyphotic   Communication  Communication Communication: Expressive difficulties   Cognition Arousal/Alertness: Awake/alert Behavior During Therapy: Flat affect Overall Cognitive Status: History of cognitive impairments - at baseline                 General Comments: Son reports cognition is close to baseline.  Processing is slower than normal.  Pt is unreliable historian, and would frequently confabulate   General Comments       Exercises       Shoulder Instructions      Home Living Family/patient expects to be discharged to:: Skilled nursing facility     Type of Home: [facility]                           Additional Comments: Pt from [facility] where he has been since 2008; per son  pt was total (A) with all BADLs except self feeding and ?grooming, and wheelchair bound.  He was unable to self propel w/c. Pt has not received any therapies since 2008/2009      Prior Functioning/Environment Level of Independence: Needs assistance  Gait / Transfers Assistance Needed: Pt required hoyer lift for tranfers per pt son.  He has not been able to propel w/c ADL's / Homemaking Assistance Needed: Total A with Bathing, dressing, and toileting.  He was able to feed self with Rt. UE and possibly was able to perform some basic grooming - son was unsure of this.  Communication / Swallowing Assistance Needed: son reports pt was ~"10% impaired" with his speech PTA      OT Diagnosis: Generalized weakness;Cognitive deficits;Hemiplegia non-dominant side;Hemiplegia dominant side   OT Problem List: Decreased strength;Impaired balance (sitting and/or standing);Decreased activity tolerance;Decreased range of motion;Decreased cognition;Decreased knowledge of use of DME or AE;Impaired UE functional use   OT Treatment/Interventions: Self-care/ADL training;Neuromuscular education;Therapeutic activities;Patient/family education    OT Goals(Current goals can be found in the care plan section) Acute  Rehab OT Goals Patient Stated Goal: none stated OT Goal Formulation: Patient unable to participate in goal setting Time For Goal Achievement: 10/09/13 Potential to Achieve Goals: Fair ADL Goals Additional ADL Goal #1: Pt will wash face with mod A using Rt. UE Additional ADL Goal #2: Pt will be able to reach for and retrieve a cylindrical object at ~60* shoulder flexion with Rt. UE and mod A  OT Frequency: Min 2X/week   Barriers to D/C: Decreased caregiver support  plan for discharge back to SNF       Co-evaluation              End of Session Nurse Communication: Mobility status  Activity Tolerance: Patient limited by fatigue Patient left: in bed;with call bell/phone within reach;with family/visitor present;with bed alarm  set   Time: 6222-9798 OT Time Calculation (min): 24 min Charges:  OT General Charges $OT Visit: 1 Procedure OT Evaluation $Initial OT Evaluation Tier I: 1 Procedure OT Treatments $Therapeutic Activity: 8-22 mins G-Codes:    Karizma Cheek M Loki Wuthrich Oct 07, 2013, 4:00 PM

## 2013-10-02 NOTE — Progress Notes (Signed)
ANTICOAGULATION CONSULT NOTE  Pharmacy Consult for heparin Indication: CVA  Patient Measurements: Height: 6\' 4"  (193 cm) Weight: 216 lb 14.9 oz (98.4 kg) IBW/kg (Calculated) : 86.8 Heparin Dosing Weight: 86kg  Vital Signs: Temp: 98.1 F (36.7 C) (05/08 2053) Temp src: Oral (05/08 2053) BP: 138/86 mmHg (05/08 2053) Pulse Rate: 85 (05/08 2053)  Labs:  Recent Labs  10/01/13 0932 10/01/13 1332  10/01/13 2052 10/02/13 0148 10/02/13 0510 10/02/13 0737 10/02/13 1125 10/02/13 2034  HGB 14.7  --   --   --   --  15.1  --   --   --   HCT 43.3  --   --   --   --  44.9  --   --   --   PLT 173  --   --   --   --  211  --   --   --   LABPROT 20.4*  --   --   --   --   --  20.5*  --   --   INR 1.80*  --   --   --   --   --  1.82*  --   --   HEPARINUNFRC  --   --   < > 0.14*  --  0.42  --  <0.10* 0.60  CREATININE 0.77  --   --   --   --   --   --   --   --   TROPONINI  --  <0.30  --  <0.30 <0.30  --   --   --   --   < > = values in this interval not displayed.   Assessment: 73 year old male on heparin drip for new TIA vs stroke.  Heparin level this evening is just above at 0.6. Verified that there has not been any problems with the line or infusion.  CBC remains stable.  Pt remains without s/sx bleeding.  INR noted to be subtherapeutic on admission, INR low today at 1.8 - warfarin has not been given yet today but is ordered  PTA dose: 2 mg po Mon/Wed/Fri, 3 mg po on all other days.  Goal of Therapy:  Heparin level 0.3-0.5 Monitor platelets by anticoagulation protocol: Yes   Plan:  Decrease heparin gtt to 1350 units/hr Next heparin level in am Daily HL, CBC, INR Monitor closely for s/sx bleeding Warfarin 5 mg po x1  Erin Hearing PharmD., BCPS Clinical Pharmacist Pager [phone] 10/02/2013 9:58 PM

## 2013-10-02 NOTE — Progress Notes (Signed)
Utilization review completed. Dayanara Sherrill, RN, BSN. 

## 2013-10-02 NOTE — Progress Notes (Signed)
Clinical Social Work Department BRIEF PSYCHOSOCIAL ASSESSMENT 10/02/2013  Patient:  Gary Reynolds,Gary Reynolds     Account Number:  [id number]     Admit date:  10/01/2013  Clinical Social Worker:  Valda Lamb  Date/Time:  10/02/2013 03:27 PM  Referred by:  CSW  Date Referred:  10/02/2013 Referred for  SNF Placement   Other Referral:   Interview type:  Patient Other interview type:   CSW also completed assessment with pt son 'Gary Reynolds.'    PSYCHOSOCIAL DATA Living Status:  FACILITY Admitted from facility:  [facility] [place] Level of care:  [place] Primary support name:  Gary Reynolds [phone] Primary support relationship to patient:  CHILD, ADULT Degree of support available:   Pt has supportive children involved in care. CSW informed that pt ex wife is pt POA information in chart.    CURRENT CONCERNS Current Concerns  Post-Acute Placement   Other Concerns:    SOCIAL WORK ASSESSMENT / PLAN Covering CSW observed that pt was admitted from a facility. CSW visited pt room and introduced herself to pt and son Gary Reynolds. CSW inquried where pt was from however CSW noticed that pt had some cognitive delays from a recent stroke. Pt able to verbalize being from [facility] however unsure which one or for how long. Pt son confirmed that pt has been at [facility] for 7 years and the plan will be for him to return at dc.    CSW confirmed with Benjamine Mola at [facility] that pt ex wife has done a bed hold and that pt is okay to return when medically stable.   Assessment/plan status:  Psychosocial Support/Ongoing Assessment of Needs Other assessment/ plan:   Information/referral to community resources:   No resources needed at this time.    PATIENT'S/FAMILY'S RESPONSE TO PLAN OF CARE: Pt and son agreeable for pt to return to [facility] when pt medically stable.  Unit CSW to assist with dc when pt is stable.       Hunt Oris, MSW,  [place]

## 2013-10-02 NOTE — Progress Notes (Signed)
Speech Language Pathology   Patient Details Name: Gary Reynolds MRN: [id number] DOB: May 18, 1941 Today's Date: 10/02/2013 Time: 1052-     MBS tentatively scheduled for MBS at 1330.  Orbie Pyo [place] [phone]  10/02/2013

## 2013-10-02 NOTE — Progress Notes (Signed)
Patient Demographics  Gary Reynolds, is a 73 y.o. male, DOB - [birth year], EAV:[id number]  Admit date - 10/01/2013   Admitting Physician Thurnell Lose, MD  Outpatient Primary MD for the patient is No primary provider on file.  LOS - 1   Chief Complaint  Patient presents with  . Weakness        Assessment & Plan    1. Decreased mental status. Due to acute ischemic left cornea radiata CVA. Likely embolic from his heart, he has underlying A. fib and his INR was subtherapeutic with a CHADVAS 2 score of at least 4 - we'll admit to a telemetry bed, reviewed MRI MRA of the brain, head CT unremarkable, on heparin drip with Coumadin monitored by pharmacy. Neuro following. Will undergo echo gram-carotid duplex-PT-OT-speech eval. Any further antiplatelet medication per neurology.  Negative EEG with no acute findings.      2. Atrial fibrillation. Currently in rate control, continue on metoprolol, Coumadin with heparin overlap as above will be continued. Pharmacy consult for both.     3. History of CAD. Stable EKG and 3 sets of troponin, we'll continue beta blocker, statin, Imdur, review echogram which is pending.    4. Left arm pain. Improved with stable x-ray of left humerus.    5. Hypertension. No acute issues continue beta blocker and Imdur.    6. GERD. Continue PPI.    7. Dyslipidemia. Continue home dose statin.     8. Redness in both eyes. Per son chronic redness in left eye. We'll place on Cipro eye drops and monitor.     Code Status: Full  Family Communication: detailed with Son Gary Reynolds the phone on 10/02/2013 8:30 AM  Disposition Plan:  Rehab   Procedures MRI-MRA Brain, TTE, Carotid,    Consults  Neuro   Medications  Scheduled Meds: . acetaminophen  650 mg Oral TID  .  ciprofloxacin  2 drop Both Eyes Q4H while awake  . Warfarin - Pharmacist Dosing Inpatient   Does not apply q1800   Continuous Infusions: . heparin 1,200 Units/hr (10/01/13 2315)   PRN Meds:.senna-docusate, traMADol  DVT Prophylaxis  Heparin - Coumadin  Lab Results  Component Value Date   PLT 211 10/02/2013    Antibiotics     Anti-infectives   None          Subjective:   Gary Reynolds today has, No headache, No chest pain, No abdominal pain - No Nausea, No new weakness tingling or numbness, No Cough - SOB.    Objective:   Filed Vitals:   10/01/13 2314 10/02/13 0120 10/02/13 0337 10/02/13 0500  BP: 157/102 160/100 154/111 154/96  Pulse: 87 76 93 85  Temp: 99 F (37.2 C) 98 F (36.7 C) 98.8 F (37.1 C) 97.9 F (36.6 C)  TempSrc: Oral Oral Oral Oral  Resp: 16 16 16 18   Height: 6\' 4"  (1.93 m)     Weight: 98.4 kg (216 lb 14.9 oz)     SpO2: 94% 96% 97% 98%    Wt Readings from Last 3 Encounters:  10/01/13 98.4 kg (216 lb 14.9 oz)    No intake or output data in the 24 hours ending 10/02/13 0825   Physical Exam  Awake -still slightly weak  and lethargic, Oriented X 2, No new F.N deficits, chronic left-sided dense hemiparesis with left arm is 0/5, left foot 1/5, Normal affect [place].AT,PERRAL, L eye slightly red (some chronic redness) Supple Neck,No JVD, No cervical lymphadenopathy appriciated.  Symmetrical Chest wall movement, Good air movement bilaterally, CTAB RRR,No Gallops,Rubs or new Murmurs, No Parasternal Heave +ve B.Sounds, Abd Soft, Non tender, No organomegaly appriciated, No rebound - guarding or rigidity. No Cyanosis, Clubbing or edema, No new Rash or bruise       Data Review   Micro Results No results found for this or any previous visit (from the past 240 hour(s)).  Radiology Reports Dg Chest 2 View  10/01/2013   CLINICAL DATA:  Weakness.  EXAM: CHEST  2 VIEW  COMPARISON:  PA and lateral chest 02/20/2007.  FINDINGS: The lungs are clear. Heart size is  normal. There is no pneumothorax or pleural effusion. No focal bony abnormality.  IMPRESSION: No acute disease.   Electronically Signed   By: Inge Rise M.D.   On: 10/01/2013 10:28   Ct Head Wo Contrast  10/01/2013   CLINICAL DATA:  Weakness.  Lethargy.  EXAM: CT HEAD WITHOUT CONTRAST  TECHNIQUE: Contiguous axial images were obtained from the base of the skull through the vertex without intravenous contrast.  COMPARISON:  04/04/2007  FINDINGS: There is no acute intracranial hemorrhage, infarction, or mass lesion. There is an old partially calcified stroke in the right basal ganglia. There is extensive periventricular white matter lucency, worse on the right than the left, consistent with chronic ischemic changes. There is a small old right cerebellar infarct. There is diffuse atrophy with secondary slight ventricular dilatation.  No acute osseous abnormalities. Dense calcification in the distal vertebral arteries.  IMPRESSION: No acute intracranial abnormality. Atrophy with old strokes and chronic small vessel ischemic changes. Atrophy has slightly progressed since the prior study.   Electronically Signed   By: Rozetta Nunnery M.D.   On: 10/01/2013 11:07   Mr Brain Wo Contrast  10/01/2013   CLINICAL DATA:  Increased weakness and lethargy a. question stroke.  EXAM: MRI HEAD WITHOUT CONTRAST  TECHNIQUE: Multiplanar, multiecho pulse sequences of the brain and surrounding structures were obtained without intravenous contrast.  COMPARISON:  CT head from the same day.  FINDINGS: The diffusion-weighted images demonstrate an acute non hemorrhagic infarct involving the left coronal radiata measuring 1.5 cm. No other acute ischemia is evident.  Remote infarcts of the right basal ganglia and frontal operculum are noted. Atrophy and extensive white matter disease is present bilaterally. T2 changes are associated with the a acute infarct. Remote hemorrhages noted within the right basal ganglia and operculum air infarcts.  There is associated ex vacuo dilation of the right lateral ventricle.  Flow is present in the major intracranial arteries. The globes and orbits are intact. The paranasal sinuses and mastoid air cells are clear.  IMPRESSION: 1. Acute nonhemorrhagic infarct involving the left coronal radiata. 2. Remote hemorrhagic infarcts of the right basal ganglia and frontal operculum. 3. Atrophy and diffuse white matter disease likely reflects the sequelae of chronic microvascular ischemia.   Electronically Signed   By: Lawrence Santiago M.D.   On: 10/01/2013 17:01   Dg Humerus Left  10/01/2013   CLINICAL DATA:  Left arm pain.  EXAM: LEFT HUMERUS - 2+ VIEW  COMPARISON:  None.  FINDINGS: Bones are osteopenic. No fracture or focal suspicious bony abnormality is identified. No discrete soft tissue swelling. There are degenerative changes of the acromioclavicular joint.  IMPRESSION: Decreased bony mineralization.  No acute bony abnormality.   Electronically Signed   By: Curlene Dolphin M.D.   On: 10/01/2013 14:41    CBC  Recent Labs Lab 10/01/13 0932 10/02/13 0510  WBC 9.6 9.3  HGB 14.7 15.1  HCT 43.3 44.9  PLT 173 211  MCV 91.9 91.4  MCH 31.2 30.8  MCHC 33.9 33.6  RDW 13.4 13.2  LYMPHSABS 2.8  --   MONOABS 0.8  --   EOSABS 0.2  --   BASOSABS 0.0  --     Chemistries   Recent Labs Lab 10/01/13 0932  NA 141  K 4.4  CL 105  CO2 22  GLUCOSE 85  BUN 15  CREATININE 0.77  CALCIUM 9.5  AST 21  ALT 17  ALKPHOS 70  BILITOT 0.3   ------------------------------------------------------------------------------------------------------------------ estimated creatinine clearance is 102.5 ml/min (by C-G formula based on Cr of 0.77). ------------------------------------------------------------------------------------------------------------------ No results found for this basename: HGBA1C,  in the last 72  hours ------------------------------------------------------------------------------------------------------------------  Recent Labs  10/02/13 0500  CHOL 152  HDL 51  LDLCALC 86  TRIG 76  CHOLHDL 3.0   ------------------------------------------------------------------------------------------------------------------ No results found for this basename: TSH, T4TOTAL, FREET3, T3FREE, THYROIDAB,  in the last 72 hours ------------------------------------------------------------------------------------------------------------------ No results found for this basename: VITAMINB12, FOLATE, FERRITIN, TIBC, IRON, RETICCTPCT,  in the last 72 hours  Coagulation profile  Recent Labs Lab 10/01/13 0932  INR 1.80*    No results found for this basename: DDIMER,  in the last 72 hours  Cardiac Enzymes  Recent Labs Lab 10/01/13 1332 10/01/13 2052 10/02/13 0148  TROPONINI <0.30 <0.30 <0.30   ------------------------------------------------------------------------------------------------------------------ No components found with this basename: POCBNP,      Time Spent in minutes   35   Thurnell Lose M.D on 10/02/2013 at 8:25 AM  Between 7am to 7pm - Pager - [phone]  After 7pm go to [email or web address] - password [email or web address]  And look for the night coverage person covering for me after hours  [facility] Office  [phone]

## 2013-10-02 NOTE — Progress Notes (Signed)
Stroke Team Progress Note  HISTORY Gary Reynolds is a 73 y.o. 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 He lives at a nursing facility and is wheel chair bound. At baseline he is able to talk and hold a conversation without significant difficulty. Son notes he usually is "10% slower in thought process than they are". Yesterday at 1600 he was noted to suddenly start staring forward, then mouthing his words but unable to vocalize, this then resolved but he was much slower in thought process and right arm was weaker than usual. He never fully resolved from this. This AM he had another spell which he was staring forward and not responding. In addition his speech has become more slurred than usual. HE was brought to ED where CT head was negative and INR was found to be sub therapeutic at 1.8.   Date last known well: Date: 09/30/2013  Time last known well: Time: 16:00  tPA Given: No: on coumadin and out of window   SUBJECTIVE The patient's son was at the bedside. He noted that his father seemed more alert today but still not back to baseline. The patient voiced no specific complaints.  OBJECTIVE Most recent Vital Signs: Filed Vitals:   10/01/13 2314 10/02/13 0120 10/02/13 0337 10/02/13 0500  BP: 157/102 160/100 154/111 154/96  Pulse: 87 76 93 85  Temp: 99 F (37.2 C) 98 F (36.7 C) 98.8 F (37.1 C) 97.9 F (36.6 C)  TempSrc: Oral Oral Oral Oral  Resp: 16 16 16 18   Height: 6\' 4"  (1.93 m)     Weight: 216 lb 14.9 oz (98.4 kg)     SpO2: 94% 96% 97% 98%   CBG (last 3)  No results found for this basename: GLUCAP,  in the last 72 hours  IV Fluid Intake:   . heparin 1,200 Units/hr (10/01/13 2315)    MEDICATIONS  . acetaminophen  650 mg Oral TID  . ciprofloxacin  2 drop Both Eyes Q4H while awake  . Warfarin - Pharmacist Dosing Inpatient   Does not apply q1800   PRN:  senna-docusate, traMADol  Diet:  NPO no liquids Activity:  Up  to chair with assistance DVT Prophylaxis:  Warfarin  CLINICALLY SIGNIFICANT STUDIES Basic Metabolic Panel:  Recent Labs Lab 10/01/13 0932  NA 141  K 4.4  CL 105  CO2 22  GLUCOSE 85  BUN 15  CREATININE 0.77  CALCIUM 9.5   Liver Function Tests:  Recent Labs Lab 10/01/13 0932  AST 21  ALT 17  ALKPHOS 70  BILITOT 0.3  PROT 7.2  ALBUMIN 3.5   CBC:  Recent Labs Lab 10/01/13 0932 10/02/13 0510  WBC 9.6 9.3  NEUTROABS 5.7  --   HGB 14.7 15.1  HCT 43.3 44.9  MCV 91.9 91.4  PLT 173 211   Coagulation:  Recent Labs Lab 10/01/13 0932 10/02/13 0737  LABPROT 20.4* 20.5*  INR 1.80* 1.82*   Cardiac Enzymes:  Recent Labs Lab 10/01/13 1332 10/01/13 2052 10/02/13 0148  TROPONINI <0.30 <0.30 <0.30   Urinalysis:  Recent Labs Lab 10/01/13 1108  COLORURINE YELLOW  LABSPEC 1.016  PHURINE 5.5  GLUCOSEU NEGATIVE  HGBUR TRACE*  BILIRUBINUR NEGATIVE  KETONESUR NEGATIVE  PROTEINUR NEGATIVE  UROBILINOGEN 0.2  NITRITE NEGATIVE  LEUKOCYTESUR NEGATIVE   Lipid Panel    Component Value Date/Time   CHOL 152 10/02/2013 0500   TRIG 76 10/02/2013 0500   HDL 51 10/02/2013 0500  CHOLHDL 3.0 10/02/2013 0500   VLDL 15 10/02/2013 0500   LDLCALC 86 10/02/2013 0500   HgbA1C  Lab Results  Component Value Date   HGBA1C  Value: 5.6 (NOTE)   The ADA recommends the following therapeutic goals for glycemic   control related to Hgb A1C measurement:   Goal of Therapy:   < 7.0% Hgb A1C   Action Suggested:  > 8.0% Hgb A1C   Ref:  Diabetes Care, 22, Suppl. 1, 1999 02/11/2007    Urine Drug Screen:   No results found for this basename: labopia, cocainscrnur, labbenz, amphetmu, thcu, labbarb    Alcohol Level: No results found for this basename: ETH,  in the last 168 hours  Dg Chest 2 View 10/01/2013    No acute disease.     Ct Head Wo Contrast 10/01/2013    No acute intracranial abnormality. Atrophy with old strokes and chronic small vessel ischemic changes. Atrophy has slightly progressed  since the prior study.     Mr Brain Wo Contrast 10/01/2013    1. Acute nonhemorrhagic infarct involving the left coronal radiata.  2. Remote hemorrhagic infarcts of the right basal ganglia and frontal operculum.  3. Atrophy and diffuse white matter disease likely reflects the sequelae of chronic microvascular ischemia.       Dg Humerus Left 10/01/2013    Decreased bony mineralization.  No acute bony abnormality.      MRA of the brain    2D Echocardiogram  ejection fraction 6065%. No cardiac source of emboli identified.  Carotid Doppler  Carotid Duplex (Doppler) has been completed. Preliminary findings: Bilateral: 1-39% ICA stenosis. Vertebral artery flow is antegrade.    EKG Atrial fibrillation. Ventricular response of 57 beats per minute.For complete results please see formal report.   Therapy Recommendations pending  Physical Exam   Mental Status:  PSYCHOMOTOR SLOWING. DECR FLUENCY. FOLLOWS COMMANDS. Alert, oriented to hospital and year but not month, thought process is slow. Speech dysarthric without evidence of aphasia. Able to follow 3 step commands without difficulty.  Cranial Nerves:  II: Discs flat bilaterally; Visual fields grossly normal, pupils equal, round, reactive to light and accommodation  III,IV, VI: ptosis not present, extra-ocular motions intact bilaterally  V,VII: smile asymmetric on the left, facial light touch sensation decreased on the left  VIII: hearing normal bilaterally  IX,X: gag reflex present  XI: Shoulder shrug absent on the left XII: midline tongue extension without atrophy or fasciculations  Motor:  RUE 4, RLE 1-2, LUE INCR TONE 1, LLE INCR TONE 1 Sensory: DECR ON LEFT SIDE  Deep Tendon Reflexes:  3+ throughout  Plantars:  Up going bilaterally  Cerebellar:  normal finger-to-nose on the right Gait: not tested--non ambulatory at baseline.    ASSESSMENT [name] is a 73 y.o. male presenting with speech difficulties and right  hemiparesis.  TPA therapy was not initiated as the patient was on Coumadin. MRI - acute nonhemorrhagic infarct involving the left coronal radiata. Infarct felt to be embolic secondary to atrial fibrillation. On warfarin prior to admission. Now on warfarin for secondary stroke prevention. Patient with resultant speech difficulties and right hemiparesis. Stroke work up underway.   Atrial fibrillation - anticoagulated with warfarin - subtherapeutic level on admission  Previous strokes  Hyperlipidemia - cholesterol 152 LDL 86 - Zocor prior to admission  Hypertension  Coronary artery disease  Gabapentin and Depakote prior to admission   Hospital day # 1  TREATMENT/PLAN  Continue  warfarin for secondary stroke prevention.  Await therapy evaluations  Mikey Bussing PA-C [facility] Pager [phone] 10/02/2013, 9:39 AM  I evaluated and examined patient, reviewed records, labs and imaging, and agree with note and plan. New left corona radiata ischemic infarction in setting of subtherapeutic INR (1.8) on coumadin for atrial fibrillation. Continue anticoagulation and therapy evaluations.   Penni Bombard, MD 06/04/4079, 4:48 PM Certified in Neurology, Neurophysiology and Neuroimaging [facility] - Stroke Team  Please refer to [email or web address] for on-call Stroke MD   To contact Stroke Continuity provider, please refer to [email or web address] After hours, contact General Neurology

## 2013-10-02 NOTE — Progress Notes (Signed)
Gary Reynolds Ha ordered pt heparin drip rated to be changed and increase to 14.66ml/hr. Rate changed and pt transported off unit to fluoroscopy. Francis Gaines Nadim Malia RN.

## 2013-10-03 ENCOUNTER — Inpatient Hospital Stay (HOSPITAL_COMMUNITY): Payer: Medicare Other

## 2013-10-03 LAB — PROTIME-INR
INR: 2.23 — ABNORMAL HIGH (ref 0.00–1.49)
Prothrombin Time: 24 seconds — ABNORMAL HIGH (ref 11.6–15.2)

## 2013-10-03 LAB — CBC
HCT: 44.3 % (ref 39.0–52.0)
Hemoglobin: 14.9 g/dL (ref 13.0–17.0)
MCH: 30.7 pg (ref 26.0–34.0)
MCHC: 33.6 g/dL (ref 30.0–36.0)
MCV: 91.3 fL (ref 78.0–100.0)
Platelets: 205 10*3/uL (ref 150–400)
RBC: 4.85 MIL/uL (ref 4.22–5.81)
RDW: 13.4 % (ref 11.5–15.5)
WBC: 10.4 10*3/uL (ref 4.0–10.5)

## 2013-10-03 LAB — HEPARIN LEVEL (UNFRACTIONATED): Heparin Unfractionated: 0.66 IU/mL (ref 0.30–0.70)

## 2013-10-03 MED ORDER — SODIUM CHLORIDE 0.9 % IV SOLN
INTRAVENOUS | Status: DC
  Administered 2013-10-03 (×2): via INTRAVENOUS

## 2013-10-03 MED ORDER — NALOXONE HCL 0.4 MG/ML IJ SOLN
0.2000 mg | Freq: Once | INTRAMUSCULAR | Status: AC
  Administered 2013-10-03: 0.2 mg via INTRAVENOUS
  Filled 2013-10-03: qty 1

## 2013-10-03 MED ORDER — FENTANYL CITRATE 0.05 MG/ML IJ SOLN
25.0000 ug | Freq: Four times a day (QID) | INTRAMUSCULAR | Status: DC | PRN
  Administered 2013-10-03 – 2013-10-06 (×6): 25 ug via INTRAVENOUS
  Filled 2013-10-03 (×6): qty 2

## 2013-10-03 NOTE — Progress Notes (Signed)
Stroke Team Progress Note  HISTORY Gary Reynolds is a 73 y.o. 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 He lives at a nursing facility and is wheel chair bound. At baseline he is able to talk and hold a conversation without significant difficulty. Son notes he usually is "10% slower in thought process than they are". Yesterday at 1600 he was noted to suddenly start staring forward, then mouthing his words but unable to vocalize, this then resolved but he was much slower in thought process and right arm was weaker than usual. He never fully resolved from this. This AM he had another spell which he was staring forward and not responding. In addition his speech has become more slurred than usual. HE was brought to ED where CT head was negative and INR was found to be sub therapeutic at 1.8.   Date last known well: Date: 09/30/2013  Time last known well: Time: 16:00  tPA Given: No: on coumadin and out of window   SUBJECTIVE  The sons are at the bedside. Appears that the patient has been quite drowsy since he was given IV morphine for back pain. This has been discontinued. Over the course today the drowsiness has improved with his speaking/speech is still quite limited. He typically can have a conversation before his most recent stroke.  OBJECTIVE Most recent Vital Signs: Filed Vitals:   10/02/13 1552 10/02/13 2053 10/03/13 0241 10/03/13 0630  BP: 159/96 138/86 122/67 144/97  Pulse: 94 85 81 96  Temp: 99.8 F (37.7 C) 98.1 F (36.7 C) 98.6 F (37 C) 98.1 F (36.7 C)  TempSrc: Oral Oral Oral Oral  Resp: 18 18 16 18   Height:      Weight:      SpO2: 97% 98% 93% 96%   CBG (last 3)  No results found for this basename: GLUCAP,  in the last 72 hours  IV Fluid Intake:   . sodium chloride 75 mL/hr at 10/02/13 2028  . heparin 1,350 Units/hr (10/03/13 0601)    MEDICATIONS  . acetaminophen  650 mg Oral TID  . ciprofloxacin  2 drop  Both Eyes Q4H while awake  . warfarin  5 mg Oral ONCE-1800  . Warfarin - Pharmacist Dosing Inpatient   Does not apply q1800   PRN:  morphine injection, senna-docusate, traMADol  Diet:  NPO no liquids Activity:  Up to chair with assistance DVT Prophylaxis:  Warfarin  CLINICALLY SIGNIFICANT STUDIES Basic Metabolic Panel:   Recent Labs Lab 10/01/13 0932  NA 141  K 4.4  CL 105  CO2 22  GLUCOSE 85  BUN 15  CREATININE 0.77  CALCIUM 9.5   Liver Function Tests:   Recent Labs Lab 10/01/13 0932  AST 21  ALT 17  ALKPHOS 70  BILITOT 0.3  PROT 7.2  ALBUMIN 3.5   CBC:   Recent Labs Lab 10/01/13 0932 10/02/13 0510 10/03/13 0450  WBC 9.6 9.3 10.4  NEUTROABS 5.7  --   --   HGB 14.7 15.1 14.9  HCT 43.3 44.9 44.3  MCV 91.9 91.4 91.3  PLT 173 211 205   Coagulation:   Recent Labs Lab 10/01/13 0932 10/02/13 0737 10/03/13 0450  LABPROT 20.4* 20.5* 24.0*  INR 1.80* 1.82* 2.23*   Cardiac Enzymes:   Recent Labs Lab 10/01/13 1332 10/01/13 2052 10/02/13 0148  TROPONINI <0.30 <0.30 <0.30   Urinalysis:   Recent Labs Lab 10/01/13 [place]  LABSPEC 1.016  PHURINE 5.5  GLUCOSEU NEGATIVE  HGBUR TRACE*  BILIRUBINUR NEGATIVE  KETONESUR NEGATIVE  PROTEINUR NEGATIVE  UROBILINOGEN 0.2  NITRITE NEGATIVE  LEUKOCYTESUR NEGATIVE   Lipid Panel    Component Value Date/Time   CHOL 152 10/02/2013 0500   TRIG 76 10/02/2013 0500   HDL 51 10/02/2013 0500   CHOLHDL 3.0 10/02/2013 0500   VLDL 15 10/02/2013 0500   LDLCALC 86 10/02/2013 0500   HgbA1C  Lab Results  Component Value Date   HGBA1C 5.5 10/02/2013    Urine Drug Screen:   No results found for this basename: labopia,  cocainscrnur,  labbenz,  amphetmu,  thcu,  labbarb    Alcohol Level: No results found for this basename: ETH,  in the last 168 hours  Dg Chest 2 View 10/01/2013    No acute disease.     Ct Head Wo Contrast 10/01/2013    No acute intracranial abnormality. Atrophy with old strokes and  chronic small vessel ischemic changes. Atrophy has slightly progressed since the prior study.     Mr Brain Wo Contrast 10/01/2013    1. Acute nonhemorrhagic infarct involving the left coronal radiata.  2. Remote hemorrhagic infarcts of the right basal ganglia and frontal operculum.  3. Atrophy and diffuse white matter disease likely reflects the sequelae of chronic microvascular ischemia.       Dg Humerus Left 10/01/2013    Decreased bony mineralization.  No acute bony abnormality.      MRA of the brain    2D Echocardiogram  ejection fraction 6065%. No cardiac source of emboli identified.  Carotid Doppler  Carotid Duplex (Doppler) has been completed. Preliminary findings: Bilateral: 1-39% ICA stenosis. Vertebral artery flow is antegrade.    EKG Atrial fibrillation. Ventricular response of 57 beats per minute.For complete results please see formal report.   Therapy Recommendations skilled nursing facility recommended  Physical Exam   Mental Status:  The patient is dosing throughout the evaluation and requires constant symptom relation to be awake. He does not follow commands consistently. Did get him to follow command once. No verbal output.  Cranial Nerves:  II: Visual fields grossly normal, pupils equal, round, reactive to light  III,IV, VI: ptosis not present, extra-ocular motions intact bilaterally  V,VII: smile asymmetric on the left, facial light touch sensation decreased on the left  VIII: hearing normal bilaterally  IX,X: gag reflex present  XI: Shoulder shrug absent on the left XII: midline tongue extension without atrophy or fasciculations  Motor:  RUE 3, RLE 1-2, LUE INCR TONE 1, LLE INCR TONE 1 Sensory: DECR ON LEFT SIDE   Gait: not tested--non ambulatory at baseline.    ASSESSMENT [name] is a 73 y.o. male presenting with speech difficulties and right hemiparesis.  TPA therapy was not initiated as the patient was on Coumadin. MRI - acute nonhemorrhagic  infarct involving the left coronal radiata. Infarct felt to be embolic secondary to atrial fibrillation. On warfarin prior to admission. Now on warfarin for secondary stroke prevention. Patient with resultant speech difficulties and right hemiparesis. Stroke work up underway.   Atrial fibrillation - anticoagulated with warfarin - subtherapeutic level on admission  Previous strokes  Hyperlipidemia - cholesterol 152 LDL 86 - Zocor prior to admission  Hypertension  Coronary artery disease  Gabapentin and Depakote prior to admission  Altered mental status possibly due to medication effect. The morphine has been discontinued.   Hospital day # 2  TREATMENT/PLAN  Continue  warfarin for secondary  stroke prevention. Concerning newer agents with more predictable anticoagulation.  Skilled nursing facility recommended  Followup Dr. Leonie Man in 2 months  EEG will be obtained.  Mikey Bussing PA-C [facility] Pager [phone] 10/03/2013, 8:24 AM  I evaluated and examined patient, reviewed records, labs and imaging, and agree with note and plan. New left corona radiata ischemic infarction in setting of subtherapeutic INR (1.8) on coumadin for atrial fibrillation. Continue anticoagulation and therapy evaluations.     Please refer to [email or web address] for on-call Stroke MD   To contact Stroke Continuity provider, please refer to [email or web address] After hours, contact General Neurology

## 2013-10-03 NOTE — Progress Notes (Signed)
Speech Language Pathology Treatment: Dysphagia  Patient Details Name: Gary Reynolds MRN: [id number] DOB: 07-22-1940 Today's Date: 10/03/2013 Time: 4098-1191 SLP Time Calculation (min): 35 min  Assessment / Plan / Recommendation Clinical Impression  Pt seen for readiness for repeat MBS and/or dietary intake.  3 sons present in room during visit. SLP explained MBS results from yesterday using a diagram for understanding.  Demonstrated use of oral suction to family to use if pt able to cough up secretions.    Pt remains with very weak phonation, throat clear and cough - intermittent cough on secretions noted indicating compromised airway protection.  Dysarthria ongoing with pt demonstrating delayed responses.   SLP set up oral suction per oral protocol given pt NPO.  Moisture provided via toothette with delayed swallow response, decreased laryngeal elevation and throat clearing after swallow - likely due to aspiration.    Pt is not ready for po intake nor MBS due to ongoing symptoms of severe dysphagia and poor airway protection.  Recommend pt remain NPO with oral care - allowing family to provide oral moisture via toothette every hour. Family observed SLP providing oral moisture and precautions for airway protection - cease moisture if pt coughing and cue to swallow as needed.  Family indicated expressed gratitude and understanding to information.   HPI HPI: 73 y.o. 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 Chest x-ray, UA, head CT, EKG were unremarkable. MRI acute nonhemorrhagic infarct involving the left coronal radiata, remote hemorrhagic infarcts of the right basal ganglia and frontal operculum.  BSE, MBS completed with recommendations for npo due to moderate oral and moderately severe pharyngeal dysphagia.  SLP visit to determine readiness for  dietary advancement.     Pertinent Vitals Afebrile, decreased  SLP Plan  Continue with current plan of care    Recommendations Diet recommendations: NPO Medication Administration: Via alternative means              Oral Care Recommendations: Oral care Q4 per protocol Follow up Recommendations: Skilled Nursing facility Plan: Continue with current plan of care    [place], [place] [facility] SLP [phone]

## 2013-10-03 NOTE — Progress Notes (Signed)
Patient Demographics  Gary Reynolds, is a 73 y.o. male, DOB - April 08, 1941, CWC:[id number]  Admit date - 10/01/2013   Admitting Physician Gary Lose, MD  Outpatient Primary MD for the patient is No primary provider on file.  LOS - 2   Chief Complaint  Patient presents with  . Weakness        Assessment & Plan    1. Decreased mental status. Due to acute ischemic left cornea radiata CVA. Likely embolic from his heart, he has underlying A. fib and his INR was subtherapeutic with a CHADVAS 2 score of at least 4 - continue to monitor on telemetry bed, reviewed MRI MRA of the brain, head CT unremarkable, and is now therapeutic we will continue Coumadin and discontinue heparin drip pharmacy monitoring INR and Coumadin dose. Neuro following. Have noted echo gram-carotid duplex, I do not think TEE will be of any benefit as patient is already anticoagulated we'll defer to neurology.- Being followed by PT-OT-speech eval. currently main issue remains a dysphagia. Any further antiplatelet medication per neurology.  Negative EEG with no acute findings.      2. Atrial fibrillation. Currently in rate control, continue on metoprolol, Coumadin with heparin overlap as above will be continued. Pharmacy consult for both.     3. History of CAD. Stable EKG and 3 sets of troponin, we'll continue beta blocker, statin, Imdur, reviewed echogram .    4. Left arm pain. Improved with stable x-ray of left humerus.    5. Hypertension. No acute issues continue beta blocker and Imdur.    6. GERD. Continue PPI.    7. Dyslipidemia. Continue home dose statin.     8. Redness in both eyes. Per son chronic redness in left eye. We'll place on Cipro eye drops and monitor.   9. Continued dysphagia. Discussed with speech  therapist, requested them to see the patient daily, currently n.p.o., discussed with patient about NG tube he would like to hold off on it for the next few days, as it is uncomfortable, will continue IV fluids for now and monitor.    Code Status: Full  Family Communication: detailed with Gary Reynolds on 10/01/2013 bedside, with his son Gary Reynolds over the phone on 10/02/2013 8:30 AM, left messages for Gary Reynolds again on 10/02/2013 at 7 PM, 10/03/2048 at 9 AM.    Disposition Plan:  Rehab   Procedures MRI-MRA Brain, TTE, Carotid,    Consults  Neuro   Medications  Scheduled Meds: . acetaminophen  650 mg Oral TID  . ciprofloxacin  2 drop Both Eyes Q4H while awake  . warfarin  5 mg Oral ONCE-1800  . Warfarin - Pharmacist Dosing Inpatient   Does not apply q1800   Continuous Infusions: . sodium chloride     PRN Meds:.morphine injection, senna-docusate, traMADol  DVT Prophylaxis  Heparin - Coumadin  Lab Results  Component Value Date   PLT 205 10/03/2013    Antibiotics     Anti-infectives   None          Subjective:   Gary Reynolds today has, No headache, No chest pain, No abdominal pain - No Nausea, No new weakness tingling or numbness, No Cough - SOB.    Objective:   Filed Vitals:  10/02/13 1552 10/02/13 2053 10/03/13 0241 10/03/13 0630  BP: 159/96 138/86 122/67 144/97  Pulse: 94 85 81 96  Temp: 99.8 F (37.7 C) 98.1 F (36.7 C) 98.6 F (37 C) 98.1 F (36.7 C)  TempSrc: Oral Oral Oral Oral  Resp: 18 18 16 18   Height:      Weight:      SpO2: 97% 98% 93% 96%    Wt Readings from Last 3 Encounters:  10/01/13 98.4 kg (216 lb 14.9 oz)     Intake/Output Summary (Last 24 hours) at 10/03/13 0907 Last data filed at 10/03/13 0644  Gross per 24 hour  Intake      0 ml  Output   1100 ml  Net  -1100 ml     Physical Exam  Awake -still slightly weak and lethargic, Oriented X 2, No new F.N deficits, chronic left-sided dense hemiparesis with left arm is 0/5, left foot  1/5, Normal affect , weak cough reflex [place].AT,PERRAL, L eye slightly red (some chronic redness) Supple Neck,No JVD, No cervical lymphadenopathy appriciated.  Symmetrical Chest wall movement, Good air movement bilaterally, CTAB RRR,No Gallops,Rubs or new Murmurs, No Parasternal Heave +ve B.Sounds, Abd Soft, Non tender, No organomegaly appriciated, No rebound - guarding or rigidity. No Cyanosis, Clubbing or edema, No new Rash or bruise       Data Review   Micro Results No results found for this or any previous visit (from the past 240 hour(s)).  Radiology Reports Dg Chest 2 View  10/01/2013   CLINICAL DATA:  Weakness.  EXAM: CHEST  2 VIEW  COMPARISON:  PA and lateral chest 02/20/2007.  FINDINGS: The lungs are clear. Heart size is normal. There is no pneumothorax or pleural effusion. No focal bony abnormality.  IMPRESSION: No acute disease.   Electronically Signed   By: Inge Rise M.D.   On: 10/01/2013 10:28   Ct Head Wo Contrast  10/01/2013   CLINICAL DATA:  Weakness.  Lethargy.  EXAM: CT HEAD WITHOUT CONTRAST  TECHNIQUE: Contiguous axial images were obtained from the base of the skull through the vertex without intravenous contrast.  COMPARISON:  04/04/2007  FINDINGS: There is no acute intracranial hemorrhage, infarction, or mass lesion. There is an old partially calcified stroke in the right basal ganglia. There is extensive periventricular white matter lucency, worse on the right than the left, consistent with chronic ischemic changes. There is a small old right cerebellar infarct. There is diffuse atrophy with secondary slight ventricular dilatation.  No acute osseous abnormalities. Dense calcification in the distal vertebral arteries.  IMPRESSION: No acute intracranial abnormality. Atrophy with old strokes and chronic small vessel ischemic changes. Atrophy has slightly progressed since the prior study.   Electronically Signed   By: Rozetta Nunnery M.D.   On: 10/01/2013 11:07   Mr Brain Wo  Contrast  10/01/2013   CLINICAL DATA:  Increased weakness and lethargy a. question stroke.  EXAM: MRI HEAD WITHOUT CONTRAST  TECHNIQUE: Multiplanar, multiecho pulse sequences of the brain and surrounding structures were obtained without intravenous contrast.  COMPARISON:  CT head from the same day.  FINDINGS: The diffusion-weighted images demonstrate an acute non hemorrhagic infarct involving the left coronal radiata measuring 1.5 cm. No other acute ischemia is evident.  Remote infarcts of the right basal ganglia and frontal operculum are noted. Atrophy and extensive white matter disease is present bilaterally. T2 changes are associated with the a acute infarct. Remote hemorrhages noted within the right basal ganglia and operculum air infarcts.  There is associated ex vacuo dilation of the right lateral ventricle.  Flow is present in the major intracranial arteries. The globes and orbits are intact. The paranasal sinuses and mastoid air cells are clear.  IMPRESSION: 1. Acute nonhemorrhagic infarct involving the left coronal radiata. 2. Remote hemorrhagic infarcts of the right basal ganglia and frontal operculum. 3. Atrophy and diffuse white matter disease likely reflects the sequelae of chronic microvascular ischemia.   Electronically Signed   By: Lawrence Santiago M.D.   On: 10/01/2013 17:01   Dg Humerus Left  10/01/2013   CLINICAL DATA:  Left arm pain.  EXAM: LEFT HUMERUS - 2+ VIEW  COMPARISON:  None.  FINDINGS: Bones are osteopenic. No fracture or focal suspicious bony abnormality is identified. No discrete soft tissue swelling. There are degenerative changes of the acromioclavicular joint.  IMPRESSION: Decreased bony mineralization.  No acute bony abnormality.   Electronically Signed   By: Curlene Dolphin M.D.   On: 10/01/2013 14:41    Carotids  - The vertebral arteries appear patent with antegrade flow. - Findings consistent with1- 39 percent stenosis involving the right internal carotid artery and the left  internal carotid artery. - ICA/CCA ratio. right = 0.74. left = 0.52   Echo  - Procedure narrative: Transthoracic echocardiography. Image quality was poor. The study was technically difficult, as a result of poor acoustic windows and poor sound wave transmission. - Left ventricle: The cavity size was normal. Wall thickness was normal. Systolic function was normal. The estimated ejection fraction was in the range of 60% to 65%. Regional wall motion abnormalities cannot be excluded. - Mitral valve: Calcified annulus. - Left atrium: The atrium was mildly dilated.  Impressions:  - Technically difficult; LV function appears to be preserved; focal wall motion abnormality cannot be excluded; if clinically indicated, TEE would have better sensitivity for source of embolus.   CBC  Recent Labs Lab 10/01/13 0932 10/02/13 0510 10/03/13 0450  WBC 9.6 9.3 10.4  HGB 14.7 15.1 14.9  HCT 43.3 44.9 44.3  PLT 173 211 205  MCV 91.9 91.4 91.3  MCH 31.2 30.8 30.7  MCHC 33.9 33.6 33.6  RDW 13.4 13.2 13.4  LYMPHSABS 2.8  --   --   MONOABS 0.8  --   --   EOSABS 0.2  --   --   BASOSABS 0.0  --   --     Chemistries   Recent Labs Lab 10/01/13 0932  NA 141  K 4.4  CL 105  CO2 22  GLUCOSE 85  BUN 15  CREATININE 0.77  CALCIUM 9.5  AST 21  ALT 17  ALKPHOS 70  BILITOT 0.3   ------------------------------------------------------------------------------------------------------------------ estimated creatinine clearance is 102.5 ml/min (by C-G formula based on Cr of 0.77). ------------------------------------------------------------------------------------------------------------------  Recent Labs  10/02/13 0510  HGBA1C 5.5   ------------------------------------------------------------------------------------------------------------------  Recent Labs  10/02/13 0500  CHOL 152  HDL 51  LDLCALC 86  TRIG 76  CHOLHDL 3.0    ------------------------------------------------------------------------------------------------------------------ No results found for this basename: TSH, T4TOTAL, FREET3, T3FREE, THYROIDAB,  in the last 72 hours ------------------------------------------------------------------------------------------------------------------ No results found for this basename: VITAMINB12, FOLATE, FERRITIN, TIBC, IRON, RETICCTPCT,  in the last 72 hours  Coagulation profile  Recent Labs Lab 10/01/13 0932 10/02/13 0737 10/03/13 0450  INR 1.80* 1.82* 2.23*    No results found for this basename: DDIMER,  in the last 72 hours  Cardiac Enzymes  Recent Labs Lab 10/01/13 1332 10/01/13 2052 10/02/13 0148  TROPONINI <0.30 <0.30 <0.30   ------------------------------------------------------------------------------------------------------------------  No components found with this basename: POCBNP,      Time Spent in minutes   35   Gary Reynolds M.D on 10/03/2013 at 9:07 AM  Between 7am to 7pm - Pager - [phone]  After 7pm go to [email or web address] - password [email or web address]  And look for the night coverage person covering for me after hours  [facility] Office  [phone]

## 2013-10-03 NOTE — Progress Notes (Signed)
ANTICOAGULATION CONSULT NOTE - Follow Up Consult  Pharmacy Consult for heparin Indication: TIA vs stroke  Labs:  Recent Labs  10/01/13 0932 10/01/13 1332  10/01/13 2052 10/02/13 0148 10/02/13 0510 10/02/13 0737 10/02/13 1125 10/02/13 2034 10/03/13 0450  HGB 14.7  --   --   --   --  15.1  --   --   --  14.9  HCT 43.3  --   --   --   --  44.9  --   --   --  44.3  PLT 173  --   --   --   --  211  --   --   --  205  LABPROT 20.4*  --   --   --   --   --  20.5*  --   --  24.0*  INR 1.80*  --   --   --   --   --  1.82*  --   --  2.23*  HEPARINUNFRC  --   --   < > 0.14*  --  0.42  --  <0.10* 0.60 0.66  CREATININE 0.77  --   --   --   --   --   --   --   --   --   TROPONINI  --  <0.30  --  <0.30 <0.30  --   --   --   --   --   < > = values in this interval not displayed.   Assessment: 73yo male remains supratherapeutic on heparin despite rate decrease.  Goal of Therapy:  Heparin level 0.3-0.5 units/ml   Plan:  Will decrease heparin gtt more aggressively to 1100 units/hr and check level in 6hr.  Wynona Neat, PharmD, BCPS  10/03/2013,7:19 AM

## 2013-10-03 NOTE — Progress Notes (Signed)
Pharmacy Consult - Heparin  INR is therapeutic this AM at 2.25 Heparin is currently discontinued  Patient is NPO and not able to take Coumadin  Plan: 1) Follow daily INR 2) Resume heparin without a bolus if INR < 2  Thank you. Anette Guarneri, PharmD [phone]

## 2013-10-04 LAB — PROTIME-INR
INR: 2.52 — ABNORMAL HIGH (ref 0.00–1.49)
Prothrombin Time: 26.3 seconds — ABNORMAL HIGH (ref 11.6–15.2)

## 2013-10-04 LAB — CBC
HCT: 43.2 % (ref 39.0–52.0)
Hemoglobin: 14.6 g/dL (ref 13.0–17.0)
MCH: 31 pg (ref 26.0–34.0)
MCHC: 33.8 g/dL (ref 30.0–36.0)
MCV: 91.7 fL (ref 78.0–100.0)
PLATELETS: 187 10*3/uL (ref 150–400)
RBC: 4.71 MIL/uL (ref 4.22–5.81)
RDW: 13.4 % (ref 11.5–15.5)
WBC: 9.9 10*3/uL (ref 4.0–10.5)

## 2013-10-04 LAB — MAGNESIUM: Magnesium: 1.9 mg/dL (ref 1.5–2.5)

## 2013-10-04 LAB — BASIC METABOLIC PANEL
BUN: 18 mg/dL (ref 6–23)
CO2: 19 mEq/L (ref 19–32)
CREATININE: 0.71 mg/dL (ref 0.50–1.35)
Calcium: 9.3 mg/dL (ref 8.4–10.5)
Chloride: 105 mEq/L (ref 96–112)
GFR calc Af Amer: >90 mL/min (ref >90)
Glucose, Bld: 110 mg/dL — ABNORMAL HIGH (ref 70–99)
POTASSIUM: 3.9 meq/L (ref 3.7–5.3)
Sodium: 143 mEq/L (ref 137–147)

## 2013-10-04 LAB — VALPROIC ACID LEVEL: Valproic Acid Lvl: <10 ug/mL — ABNORMAL LOW (ref 50.0–100.0)

## 2013-10-04 MED ORDER — HYDRALAZINE HCL 20 MG/ML IJ SOLN: 10.0000 mg | Freq: Four times a day (QID) | INTRAMUSCULAR | Status: DC | PRN

## 2013-10-04 MED ORDER — METOPROLOL TARTRATE 1 MG/ML IV SOLN
5.0000 mg | INTRAVENOUS | Status: DC | PRN
  Administered 2013-10-06: 5 mg via INTRAVENOUS
  Filled 2013-10-04 (×2): qty 5

## 2013-10-04 MED ORDER — NITROGLYCERIN 0.4 MG SL SUBL: 0.4000 mg | SUBLINGUAL_TABLET | SUBLINGUAL | Status: DC | PRN

## 2013-10-04 MED ORDER — SODIUM CHLORIDE 0.9 % IV SOLN
INTRAVENOUS | Status: DC
  Administered 2013-10-04 – 2013-10-05 (×2): via INTRAVENOUS

## 2013-10-04 MED ORDER — ACETAMINOPHEN 650 MG RE SUPP
325.0000 mg | RECTAL | Status: DC | PRN
  Filled 2013-10-04: qty 1

## 2013-10-04 NOTE — Progress Notes (Addendum)
Stroke Team Progress Note  HISTORY Gary Reynolds is a 73 y.o. 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 He lives at a nursing facility and is wheel chair bound. At baseline he is able to talk and hold a conversation without significant difficulty. Son notes he usually is "10% slower in thought process than they are". Yesterday at 1600 he was noted to suddenly start staring forward, then mouthing his words but unable to vocalize, this then resolved but he was much slower in thought process and right arm was weaker than usual. He never fully resolved from this. This AM he had another spell which he was staring forward and not responding. In addition his speech has become more slurred than usual. HE was brought to ED where CT head was negative and INR was found to be sub therapeutic at 1.8.   Date last known well: Date: 09/30/2013  Time last known well: Time: 16:00  tPA Given: No: on coumadin and out of window   SUBJECTIVE  Over the course today the drowsiness has improved with his speaking/speech is still quite limited. He typically can have a conversation before his most recent stroke. Case is discussed with the son over the phone. The son reports that he continues to improve but still not at baseline. Speech and conversation is a lot less than it typically as before his admission.  OBJECTIVE Most recent Vital Signs: Filed Vitals:   10/03/13 2333 10/04/13 0225 10/04/13 0236 10/04/13 0543  BP: 149/88 158/108 158/100 152/101  Pulse: 88 96  89  Temp: 98.4 F (36.9 C) 97.9 F (36.6 C)  98 F (36.7 C)  TempSrc: Oral Oral  Oral  Resp: 18 18  18   Height:      Weight:      SpO2: 96% 95%  93%   CBG (last 3)  No results found for this basename: GLUCAP,  in the last 72 hours  IV Fluid Intake:   . sodium chloride      MEDICATIONS  . acetaminophen  650 mg Oral TID  . ciprofloxacin  2 drop Both Eyes Q4H while awake  . Warfarin -  Pharmacist Dosing Inpatient   Does not apply q1800   PRN:  fentaNYL, senna-docusate, traMADol  Diet:  NPO no liquids Activity:  Up to chair with assistance DVT Prophylaxis:  Warfarin  CLINICALLY SIGNIFICANT STUDIES Basic Metabolic Panel:   Recent Labs Lab 10/01/13 0932 10/04/13 0515  NA 141 143  K 4.4 3.9  CL 105 105  CO2 22 19  GLUCOSE 85 110*  BUN 15 18  CREATININE 0.77 0.71  CALCIUM 9.5 9.3  MG  --  1.9   Liver Function Tests:   Recent Labs Lab 10/01/13 0932  AST 21  ALT 17  ALKPHOS 70  BILITOT 0.3  PROT 7.2  ALBUMIN 3.5   CBC:   Recent Labs Lab 10/01/13 0932  10/03/13 0450 10/04/13 0515  WBC 9.6  < > 10.4 9.9  NEUTROABS 5.7  --   --   --   HGB 14.7  < > 14.9 14.6  HCT 43.3  < > 44.3 43.2  MCV 91.9  < > 91.3 91.7  PLT 173  < > 205 187  < > = values in this interval not displayed. Coagulation:   Recent Labs Lab 10/01/13 0932 10/02/13 0737 10/03/13 0450 10/04/13 0515  LABPROT 20.4* 20.5* 24.0* 26.3*  INR 1.80* 1.82* 2.23* 2.52*  Cardiac Enzymes:   Recent Labs Lab 10/01/13 1332 10/01/13 2052 10/02/13 0148  TROPONINI <0.30 <0.30 <0.30   Urinalysis:   Recent Labs Lab 10/01/13 1108  COLORURINE YELLOW  LABSPEC 1.016  PHURINE 5.5  GLUCOSEU NEGATIVE  HGBUR TRACE*  BILIRUBINUR NEGATIVE  KETONESUR NEGATIVE  PROTEINUR NEGATIVE  UROBILINOGEN 0.2  NITRITE NEGATIVE  LEUKOCYTESUR NEGATIVE   Lipid Panel    Component Value Date/Time   CHOL 152 10/02/2013 0500   TRIG 76 10/02/2013 0500   HDL 51 10/02/2013 0500   CHOLHDL 3.0 10/02/2013 0500   VLDL 15 10/02/2013 0500   LDLCALC 86 10/02/2013 0500   HgbA1C  Lab Results  Component Value Date   HGBA1C 5.5 10/02/2013    Urine Drug Screen:   No results found for this basename: labopia,  cocainscrnur,  labbenz,  amphetmu,  thcu,  labbarb    Alcohol Level: No results found for this basename: ETH,  in the last 168 hours  Dg Chest 2 View 10/01/2013    No acute disease.     Ct Head Wo  Contrast 10/01/2013    No acute intracranial abnormality. Atrophy with old strokes and chronic small vessel ischemic changes. Atrophy has slightly progressed since the prior study.     CT of Head without Contrast 10/03/2013 No definite progression of patient's known small focal acute infarct over the left corona radiata. Moderate chronic ischemic microvascular disease with old right basal ganglia infarct and age related atrophy.   Mr Brain Wo Contrast 10/01/2013    1. Acute nonhemorrhagic infarct involving the left coronal radiata.  2. Remote hemorrhagic infarcts of the right basal ganglia and frontal operculum.  3. Atrophy and diffuse white matter disease likely reflects the sequelae of chronic microvascular ischemia.     EEG 10/01/2013 This EEG is characterized by slowing which is consistent with normal drowse. Can not rule out the possibility of slowing related to general cerebral disturbance such as a metabolic encephalopathy. Clinical correlation recommended. No epileptiform activity is noted.   Dg Humerus Left 10/01/2013    Decreased bony mineralization.  No acute bony abnormality.      MRA of the brain    2D Echocardiogram  ejection fraction 6065%. No cardiac source of emboli identified.  Carotid Doppler  Carotid Duplex (Doppler) has been completed. Preliminary findings: Bilateral: 1-39% ICA stenosis. Vertebral artery flow is antegrade.    EKG Atrial fibrillation. Ventricular response of 57 beats per minute.For complete results please see formal report.   Therapy Recommendations skilled nursing facility recommended  Physical Exam   Mental Status:  He is clearly more responsive today. He is awake and alert but continues to have marked limitation of speech. He knows that he is in the hospital and can. There is marked psychomotor slowing throughout. He follows commands.  Cranial Nerves:  II: Visual fields grossly normal, pupils equal, round, reactive to light  III,IV, VI:  ptosis not present, extra-ocular motions intact bilaterally  V,VII: smile asymmetric on the left, facial light touch sensation decreased on the left  VIII: hearing normal bilaterally  IX,X: gag reflex present  XI: Shoulder shrug absent on the left XII: midline tongue extension without atrophy or fasciculations  Motor:  RUE 3, RLE 1-2, LUE INCR TONE 1, LLE INCR TONE 1 Sensory: DECR ON LEFT SIDE   Gait: not tested--non ambulatory at baseline.    ASSESSMENT [name] is a 73 y.o. male presenting with speech difficulties and right hemiparesis.  TPA therapy was not initiated as  the patient was on Coumadin. MRI - acute nonhemorrhagic infarct involving the left coronal radiata. Infarct felt to be embolic secondary to atrial fibrillation. On warfarin prior to admission. Now on warfarin for secondary stroke prevention. Patient with resultant speech difficulties and right hemiparesis. Stroke work up underway.   Atrial fibrillation - anticoagulated with warfarin - subtherapeutic level on admission  Previous strokes  Hyperlipidemia - cholesterol 152 LDL 86 - Zocor prior to admission  Hypertension  Coronary artery disease  Gabapentin and Depakote prior to admission  Altered mental status possibly due to medication effect. The morphine has been discontinued. Repeat CT scan unremarkable. A Depakote level will be obtained.    Hospital day # 3  TREATMENT/PLAN  Continue  warfarin for secondary stroke prevention. Concerning newer agents with more predictable anticoagulation.  Skilled nursing facility recommended  EEG as above  Repeat head 10/03/2013 CT showed no definite progression of infarct.  Followup Dr. Leonie Man in 2 months. A Depakote level will be obtained.  Case discussed with the son over the phone. CT scan results also reviewed the patient's son.   Mikey Bussing PA-C [facility] Pager [phone] 10/04/2013, 8:05 AM  I evaluated and examined patient,  reviewed records, labs and imaging, and agree with note and plan. New left corona radiata ischemic infarction in setting of subtherapeutic INR (1.8) on coumadin for atrial fibrillation. Continue anticoagulation and therapy evaluations.     Please refer to [email or web address] for on-call Stroke MD   To contact Stroke Continuity provider, please refer to [email or web address] After hours, contact General Neurology

## 2013-10-04 NOTE — Progress Notes (Signed)
ANTICOAGULATION CONSULT NOTE  Pharmacy Consult for heparin/warfarin Indication: CVA  Patient Measurements: Height: 6\' 4"  (193 cm) Weight: 216 lb 14.9 oz (98.4 kg) IBW/kg (Calculated) : 86.8 Heparin Dosing Weight: 86kg  Vital Signs: Temp: 97.7 F (36.5 C) (05/10 1010) Temp src: Oral (05/10 1010) BP: 157/95 mmHg (05/10 1010) Pulse Rate: 83 (05/10 1010)  Labs:  Recent Labs  10/01/13 2052 10/02/13 0148  10/02/13 0510 10/02/13 0737 10/02/13 1125 10/02/13 2034 10/03/13 0450 10/04/13 0515  HGB  --   --   < > 15.1  --   --   --  14.9 14.6  HCT  --   --   --  44.9  --   --   --  44.3 43.2  PLT  --   --   --  211  --   --   --  205 187  LABPROT  --   --   --   --  20.5*  --   --  24.0* 26.3*  INR  --   --   --   --  1.82*  --   --  2.23* 2.52*  HEPARINUNFRC 0.14*  --   --  0.42  --  <0.10* 0.60 0.66  --   CREATININE  --   --   --   --   --   --   --   --  0.71  TROPONINI <0.30 <0.30  --   --   --   --   --   --   --   < > = values in this interval not displayed.   Assessment: 73 year old male admitted 10/01/2013   with increased weakness, lethargy and altered mental status. 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 Wheelchair bound from nursing facility. Pharmacy consulted to start heparin and warfarin  PMH: Dyslipidemia, HTN, CAD, AFib, GERD  Anticoagulation: new CVA + AFib. After 1 dose warfarin 4 mg 5/7 INR now at goal.  Unable to take further warfarin due to being NPO.   Heparin dced by Dr. Candiss Norse 5/9 for therapeutic INR. Currently NPO due to dysphagia and no tube. IF INR falls, resume heparin without a bolus CBC wnl, no bleeding noted PTA warfarin dose: 2 mg Mon, Wed, Fri, 3 mg on all other days  Goal of Therapy:  Heparin level 0.3-0.5 INR 2-3 Monitor platelets by anticoagulation protocol: Yes   Plan:  Daily CBC, INR Monitor closely for s/sx bleeding No warfarin today due to NPO INR at goal so will not start heparin  Thank you for allowing pharmacy to  be a part of this patients care team.  Rowe Robert Pharm.D., BCPS, AQ-Cardiology Clinical Pharmacist 10/04/2013 3:01 PM Pager: [phone] Phone: [phone]

## 2013-10-04 NOTE — Progress Notes (Signed)
SLP Cancellation Note  Patient Details Name: Gary Reynolds MRN: [id number] DOB: February 18, 1941   Cancelled treatment:  ST to f/u on 10/05/13 to determine PO readiness vs. completion of objective evaluation.  Sharman Crate [place], [place] Marcille Buffy 10/04/2013, 5:36 PM

## 2013-10-04 NOTE — Progress Notes (Signed)
Patient Demographics  Gary Reynolds, is a 73 y.o. male, DOB - 1941/04/04, [id number]  Admit date - 10/01/2013   Admitting Physician Gary Lose, MD  Outpatient Primary MD for the patient is No primary provider on file.  LOS - 3   Chief Complaint  Patient presents with  . Weakness        Assessment & Plan    1. Decreased mental status. Due to acute ischemic left cornea radiata CVA. Likely embolic from his heart, he has underlying A. fib and his INR was subtherapeutic with a CHADVAS 2 score of at least 4 - continue to monitor on telemetry bed, reviewed MRI MRA of the brain, head CT unremarkable, INR is now therapeutic we will continue Coumadin. Hep gtt discontinued, pharmacy monitoring INR and Coumadin dose. Neuro following. Initial EEG negative on 10/01/2013.  Have noted echo gram-carotid duplex, I do not think TEE will be of any benefit as patient is already anticoagulated we'll defer to neurology. Being followed by PT-OT-speech eval. currently main issue remains a dysphagia and lethargy. Any further antiplatelet medication per neurology.   Per nuero note they might repeat EEG on Monday .       2. Atrial fibrillation. Currently in rate control, as needed IV Lopressor, INR currently therapeutic, heparin/Coumadin as needed. Currently n.p.o.    3. History of CAD. Stable EKG and 3 sets of troponin, as needed beta blocker IV as he is n.p.o., sublingual nitroglycerin when necessary.   4. Left arm pain. Improved with stable x-ray of left humerus.    5. Hypertension. No acute issues, unable to oral medications we'll place on hydralazine as needed.   6. GERD. Continue PPI.    7. Dyslipidemia. Resume home dose statin once taking oral diet.     8. Redness in both eyes. Per son chronic  redness in left eye. We'll place on Cipro eye drops and monitor.   9. Continued dysphagia. Discussed with speech therapist, requested them to see the patient daily, currently n.p.o., discussed with patient and son Gary Reynolds on 10/03/2013 about NG tube he would like to hold off on it for the next few days, as it is uncomfortable, will continue IV fluids for now and monitor.    Code Status: Full  Family Communication: detailed with Gary Reynolds on 10/01/2013 bedside, with his son Gary Reynolds over the phone on 10/02/2013 8:30 AM, left messages for Gary again on 10/02/2013 at 7 PM, 10/03/2048 at 9 AM on phone and then 10 AM in person, left message for Gary Reynolds again 10/04/2013 at 10:15 AM.    Disposition Plan:  Rehab   Procedures MRI-MRA Brain, TTE, Carotid, EEG   Consults  Neuro   Medications  Scheduled Meds: . acetaminophen  650 mg Oral TID  . ciprofloxacin  2 drop Both Eyes Q4H while awake  . Warfarin - Pharmacist Dosing Inpatient   Does not apply q1800   Continuous Infusions: . sodium chloride 75 mL/hr at 10/04/13 0846   PRN Meds:.fentaNYL, senna-docusate, traMADol  DVT Prophylaxis  Heparin - Coumadin  Lab Results  Component Value Date   PLT 187 10/04/2013   Lab Results  Component Value Date   INR 2.52* 10/04/2013   INR 2.23* 10/03/2013   INR  1.82* 10/02/2013     Antibiotics     Anti-infectives   None          Subjective:   Gary Reynolds today has, No headache, No chest pain, No abdominal pain - No Nausea, No new weakness tingling or numbness, No Cough - SOB.    Objective:   Filed Vitals:   10/04/13 0225 10/04/13 0236 10/04/13 0543 10/04/13 1010  BP: 158/108 158/100 152/101 157/95  Pulse: 96  89 83  Temp: 97.9 F (36.6 C)  98 F (36.7 C) 97.7 F (36.5 C)  TempSrc: Oral  Oral Oral  Resp: 18  18 18   Height:      Weight:      SpO2: 95%  93% 97%    Wt Readings from Last 3 Encounters:  10/01/13 98.4 kg (216 lb 14.9 oz)     Intake/Output Summary  (Last 24 hours) at 10/04/13 1015 Last data filed at 10/03/13 1500  Gross per 24 hour  Intake      0 ml  Output    300 ml  Net   -300 ml     Physical Exam  Awake -still slightly weak and lethargic, Oriented X 2, No new F.N deficits, chronic left-sided dense hemiparesis with left arm is 0/5, left foot 1/5, Normal affect , weak cough reflex [place].AT,PERRAL, L eye slightly red (some chronic redness) Supple Neck,No JVD, No cervical lymphadenopathy appriciated.  Symmetrical Chest wall movement, Good air movement bilaterally, CTAB RRR,No Gallops,Rubs or new Murmurs, No Parasternal Heave +ve B.Sounds, Abd Soft, Non tender, No organomegaly appriciated, No rebound - guarding or rigidity. No Cyanosis, Clubbing or edema, No new Rash or bruise       Data Review   Micro Results No results found for this or any previous visit (from the past 240 hour(s)).  Radiology Reports Dg Chest 2 View  10/01/2013   CLINICAL DATA:  Weakness.  EXAM: CHEST  2 VIEW  COMPARISON:  PA and lateral chest 02/20/2007.  FINDINGS: The lungs are clear. Heart size is normal. There is no pneumothorax or pleural effusion. No focal bony abnormality.  IMPRESSION: No acute disease.   Electronically Signed   By: Inge Rise M.D.   On: 10/01/2013 10:28   Ct Head Wo Contrast  10/01/2013   CLINICAL DATA:  Weakness.  Lethargy.  EXAM: CT HEAD WITHOUT CONTRAST  TECHNIQUE: Contiguous axial images were obtained from the base of the skull through the vertex without intravenous contrast.  COMPARISON:  04/04/2007  FINDINGS: There is no acute intracranial hemorrhage, infarction, or mass lesion. There is an old partially calcified stroke in the right basal ganglia. There is extensive periventricular white matter lucency, worse on the right than the left, consistent with chronic ischemic changes. There is a small old right cerebellar infarct. There is diffuse atrophy with secondary slight ventricular dilatation.  No acute osseous abnormalities.  Dense calcification in the distal vertebral arteries.  IMPRESSION: No acute intracranial abnormality. Atrophy with old strokes and chronic small vessel ischemic changes. Atrophy has slightly progressed since the prior study.   Electronically Signed   By: Rozetta Nunnery M.D.   On: 10/01/2013 11:07   Mr Brain Wo Contrast  10/01/2013   CLINICAL DATA:  Increased weakness and lethargy a. question stroke.  EXAM: MRI HEAD WITHOUT CONTRAST  TECHNIQUE: Multiplanar, multiecho pulse sequences of the brain and surrounding structures were obtained without intravenous contrast.  COMPARISON:  CT head from the same day.  FINDINGS: The diffusion-weighted images demonstrate an  acute non hemorrhagic infarct involving the left coronal radiata measuring 1.5 cm. No other acute ischemia is evident.  Remote infarcts of the right basal ganglia and frontal operculum are noted. Atrophy and extensive white matter disease is present bilaterally. T2 changes are associated with the a acute infarct. Remote hemorrhages noted within the right basal ganglia and operculum air infarcts. There is associated ex vacuo dilation of the right lateral ventricle.  Flow is present in the major intracranial arteries. The globes and orbits are intact. The paranasal sinuses and mastoid air cells are clear.  IMPRESSION: 1. Acute nonhemorrhagic infarct involving the left coronal radiata. 2. Remote hemorrhagic infarcts of the right basal ganglia and frontal operculum. 3. Atrophy and diffuse white matter disease likely reflects the sequelae of chronic microvascular ischemia.   Electronically Signed   By: Lawrence Santiago M.D.   On: 10/01/2013 17:01   Dg Humerus Left  10/01/2013   CLINICAL DATA:  Left arm pain.  EXAM: LEFT HUMERUS - 2+ VIEW  COMPARISON:  None.  FINDINGS: Bones are osteopenic. No fracture or focal suspicious bony abnormality is identified. No discrete soft tissue swelling. There are degenerative changes of the acromioclavicular joint.  IMPRESSION:  Decreased bony mineralization.  No acute bony abnormality.   Electronically Signed   By: Curlene Dolphin M.D.   On: 10/01/2013 14:41    Carotids  - The vertebral arteries appear patent with antegrade flow. - Findings consistent with1- 39 percent stenosis involving the right internal carotid artery and the left internal carotid artery. - ICA/CCA ratio. right = 0.74. left = 0.52   Echo  - Procedure narrative: Transthoracic echocardiography. Image quality was poor. The study was technically difficult, as a result of poor acoustic windows and poor sound wave transmission. - Left ventricle: The cavity size was normal. Wall thickness was normal. Systolic function was normal. The estimated ejection fraction was in the range of 60% to 65%. Regional wall motion abnormalities cannot be excluded. - Mitral valve: Calcified annulus. - Left atrium: The atrium was mildly dilated.  Impressions:  - Technically difficult; LV function appears to be preserved; focal wall motion abnormality cannot be excluded; if clinically indicated, TEE would have better sensitivity for source of embolus.   CBC  Recent Labs Lab 10/01/13 0932 10/02/13 0510 10/03/13 0450 10/04/13 0515  WBC 9.6 9.3 10.4 9.9  HGB 14.7 15.1 14.9 14.6  HCT 43.3 44.9 44.3 43.2  PLT 173 211 205 187  MCV 91.9 91.4 91.3 91.7  MCH 31.2 30.8 30.7 31.0  MCHC 33.9 33.6 33.6 33.8  RDW 13.4 13.2 13.4 13.4  LYMPHSABS 2.8  --   --   --   MONOABS 0.8  --   --   --   EOSABS 0.2  --   --   --   BASOSABS 0.0  --   --   --     Chemistries   Recent Labs Lab 10/01/13 0932 10/04/13 0515  NA 141 143  K 4.4 3.9  CL 105 105  CO2 22 19  GLUCOSE 85 110*  BUN 15 18  CREATININE 0.77 0.71  CALCIUM 9.5 9.3  MG  --  1.9  AST 21  --   ALT 17  --   ALKPHOS 70  --   BILITOT 0.3  --    ------------------------------------------------------------------------------------------------------------------ estimated creatinine clearance is 102.5 ml/min  (by C-G formula based on Cr of 0.71). ------------------------------------------------------------------------------------------------------------------  Recent Labs  10/02/13 0510  HGBA1C 5.5   ------------------------------------------------------------------------------------------------------------------  Recent  Labs  10/02/13 0500  CHOL 152  HDL 51  LDLCALC 86  TRIG 76  CHOLHDL 3.0   ------------------------------------------------------------------------------------------------------------------ No results found for this basename: TSH, T4TOTAL, FREET3, T3FREE, THYROIDAB,  in the last 72 hours ------------------------------------------------------------------------------------------------------------------ No results found for this basename: VITAMINB12, FOLATE, FERRITIN, TIBC, IRON, RETICCTPCT,  in the last 72 hours  Coagulation profile  Recent Labs Lab 10/01/13 0932 10/02/13 0737 10/03/13 0450 10/04/13 0515  INR 1.80* 1.82* 2.23* 2.52*    No results found for this basename: DDIMER,  in the last 72 hours  Cardiac Enzymes  Recent Labs Lab 10/01/13 1332 10/01/13 2052 10/02/13 0148  TROPONINI <0.30 <0.30 <0.30   ------------------------------------------------------------------------------------------------------------------ No components found with this basename: POCBNP,      Time Spent in minutes   35   Gary Reynolds M.D on 10/04/2013 at 10:15 AM  Between 7am to 7pm - Pager - [phone]  After 7pm go to [email or web address] - password [email or web address]  And look for the night coverage person covering for me after hours  [facility] Office  [phone]

## 2013-10-05 ENCOUNTER — Inpatient Hospital Stay (HOSPITAL_COMMUNITY): Payer: Medicare Other

## 2013-10-05 LAB — PROTIME-INR
INR: 2.78 — ABNORMAL HIGH (ref 0.00–1.49)
Prothrombin Time: 28.4 seconds — ABNORMAL HIGH (ref 11.6–15.2)

## 2013-10-05 LAB — CBC
HEMATOCRIT: 42.2 % (ref 39.0–52.0)
Hemoglobin: 14.2 g/dL (ref 13.0–17.0)
MCH: 30.7 pg (ref 26.0–34.0)
MCHC: 33.6 g/dL (ref 30.0–36.0)
MCV: 91.3 fL (ref 78.0–100.0)
PLATELETS: 203 10*3/uL (ref 150–400)
RBC: 4.62 MIL/uL (ref 4.22–5.81)
RDW: 13.3 % (ref 11.5–15.5)
WBC: 11 10*3/uL — ABNORMAL HIGH (ref 4.0–10.5)

## 2013-10-05 MED ORDER — SODIUM CHLORIDE 0.9 % IV SOLN
INTRAVENOUS | Status: DC
  Administered 2013-10-05 (×2): via INTRAVENOUS
  Administered 2013-10-06: 1000 mL via INTRAVENOUS

## 2013-10-05 NOTE — Progress Notes (Signed)
ANTICOAGULATION CONSULT NOTE  Pharmacy Consult for heparin/warfarin Indication: CVA  Patient Measurements: Height: 6\' 4"  (193 cm) Weight: 216 lb 14.9 oz (98.4 kg) IBW/kg (Calculated) : 86.8 Heparin Dosing Weight: 86kg  Vital Signs: Temp: 98.5 F (36.9 C) (05/11 1406) Temp src: Oral (05/11 1406) BP: 142/96 mmHg (05/11 1406) Pulse Rate: 96 (05/11 1406)  Labs:  Recent Labs  10/02/13 2034  10/03/13 0450 10/04/13 0515 10/05/13 0452  HGB  --   < > 14.9 14.6 14.2  HCT  --   --  44.3 43.2 42.2  PLT  --   --  205 187 203  LABPROT  --   --  24.0* 26.3* 28.4*  INR  --   --  2.23* 2.52* 2.78*  HEPARINUNFRC 0.60  --  0.66  --   --   CREATININE  --   --   --  0.71  --   < > = values in this interval not displayed.   Assessment: Heparin level 0.3-0.5 INR 2-3 Monitor platelets by anticoagulation protocol: Yes   Plan:  Daily CBC, INR Monitor closely for s/sx bleeding No warfarin today due to NPO INR at goal so will not start heparin  Thank you for allowing pharmacy to be a part of this patients care team. Eudelia Bunch, Pharm.D. [phone] 10/05/2013 2:42 PM

## 2013-10-05 NOTE — Progress Notes (Signed)
Stroke Team Progress Note  HISTORY Gary Reynolds is a 73 y.o. 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 He lives at a nursing facility and is wheel chair bound. At baseline he is able to talk and hold a conversation without significant difficulty. Son notes he usually is "10% slower in thought process than they are". Yesterday at 1600 he was noted to suddenly start staring forward, then mouthing his words but unable to vocalize, this then resolved but he was much slower in thought process and right arm was weaker than usual. He never fully resolved from this. This AM he had another spell which he was staring forward and not responding. In addition his speech has become more slurred than usual. HE was brought to ED where CT head was negative and INR was found to be sub therapeutic at 1.8.   Date last known well: Date: 09/30/2013  Time last known well: Time: 16:00  tPA Given: No: on coumadin and out of window   SUBJECTIVE The patient's son was present this morning. There is been no significant change in the patient's overall status.  OBJECTIVE Most recent Vital Signs: Filed Vitals:   10/04/13 1759 10/04/13 2105 10/05/13 0221 10/05/13 0348  BP: 160/103 150/97 174/151 148/78  Pulse: 97 93 113 93  Temp: 98.2 F (36.8 C) 98.8 F (37.1 C) 98.5 F (36.9 C) 98.1 F (36.7 C)  TempSrc: Oral Oral Oral Oral  Resp: 18 18 18 18   Height:      Weight:      SpO2: 99% 95% 95% 96%   CBG (last 3)  No results found for this basename: GLUCAP,  in the last 72 hours  IV Fluid Intake:   . sodium chloride 75 mL/hr at 10/05/13 0336    MEDICATIONS  . ciprofloxacin  2 drop Both Eyes Q4H while awake  . Warfarin - Pharmacist Dosing Inpatient   Does not apply q1800   PRN:  acetaminophen, fentaNYL, hydrALAZINE, metoprolol, nitroGLYCERIN, senna-docusate  Diet:  NPO no liquids Activity:  Up to chair with assistance DVT Prophylaxis:   Warfarin  CLINICALLY SIGNIFICANT STUDIES Basic Metabolic Panel:   Recent Labs Lab 10/01/13 0932 10/04/13 0515  NA 141 143  K 4.4 3.9  CL 105 105  CO2 22 19  GLUCOSE 85 110*  BUN 15 18  CREATININE 0.77 0.71  CALCIUM 9.5 9.3  MG  --  1.9   Liver Function Tests:   Recent Labs Lab 10/01/13 0932  AST 21  ALT 17  ALKPHOS 70  BILITOT 0.3  PROT 7.2  ALBUMIN 3.5   CBC:   Recent Labs Lab 10/01/13 0932  10/04/13 0515 10/05/13 0452  WBC 9.6  < > 9.9 11.0*  NEUTROABS 5.7  --   --   --   HGB 14.7  < > 14.6 14.2  HCT 43.3  < > 43.2 42.2  MCV 91.9  < > 91.7 91.3  PLT 173  < > 187 203  < > = values in this interval not displayed. Coagulation:   Recent Labs Lab 10/02/13 0737 10/03/13 0450 10/04/13 0515 10/05/13 0452  LABPROT 20.5* 24.0* 26.3* 28.4*  INR 1.82* 2.23* 2.52* 2.78*   Cardiac Enzymes:   Recent Labs Lab 10/01/13 1332 10/01/13 2052 10/02/13 0148  TROPONINI <0.30 <0.30 <0.30   Urinalysis:   Recent Labs Lab 10/01/13 [place]  LABSPEC 1.016  PHURINE 5.5  GLUCOSEU NEGATIVE  HGBUR  TRACE*  BILIRUBINUR NEGATIVE  KETONESUR NEGATIVE  PROTEINUR NEGATIVE  UROBILINOGEN 0.2  NITRITE NEGATIVE  LEUKOCYTESUR NEGATIVE   Lipid Panel    Component Value Date/Time   CHOL 152 10/02/2013 0500   TRIG 76 10/02/2013 0500   HDL 51 10/02/2013 0500   CHOLHDL 3.0 10/02/2013 0500   VLDL 15 10/02/2013 0500   LDLCALC 86 10/02/2013 0500   HgbA1C  Lab Results  Component Value Date   HGBA1C 5.5 10/02/2013    Urine Drug Screen:   No results found for this basename: labopia,  cocainscrnur,  labbenz,  amphetmu,  thcu,  labbarb    Alcohol Level: No results found for this basename: ETH,  in the last 168 hours  Dg Chest 2 View 10/01/2013    No acute disease.     Ct Head Wo Contrast 10/01/2013    No acute intracranial abnormality. Atrophy with old strokes and chronic small vessel ischemic changes. Atrophy has slightly progressed since the prior study.     CT of  Head without Contrast 10/03/2013 No definite progression of patient's known small focal acute infarct over the left corona radiata. Moderate chronic ischemic microvascular disease with old right basal ganglia infarct and age related atrophy.   Mr Brain Wo Contrast 10/01/2013    1. Acute nonhemorrhagic infarct involving the left coronal radiata.  2. Remote hemorrhagic infarcts of the right basal ganglia and frontal operculum.  3. Atrophy and diffuse white matter disease likely reflects the sequelae of chronic microvascular ischemia.     EEG 10/01/2013 This EEG is characterized by slowing which is consistent with normal drowse. Can not rule out the possibility of slowing related to general cerebral disturbance such as a metabolic encephalopathy. Clinical correlation recommended. No epileptiform activity is noted.   Dg Humerus Left 10/01/2013    Decreased bony mineralization.  No acute bony abnormality.      MRA of the brain    2D Echocardiogram  ejection fraction 6065%. No cardiac source of emboli identified.  Carotid Doppler  Carotid Duplex (Doppler) has been completed. Preliminary findings: Bilateral: 1-39% ICA stenosis. Vertebral artery flow is antegrade.    EKG Atrial fibrillation. Ventricular response of 57 beats per minute.For complete results please see formal report.   Therapy Recommendations skilled nursing facility recommended  Physical Exam   Mental Status:  He is clearly more responsive today. He is awake and alert but continues to have marked limitation of speech. He knows that he is in the hospital and can. There is marked psychomotor slowing throughout. He follows commands.  Cranial Nerves:  II: Visual fields grossly normal, pupils equal, round, reactive to light  III,IV, VI: ptosis not present, extra-ocular motions intact bilaterally  V,VII: smile asymmetric on the left, facial light touch sensation decreased on the left  VIII: hearing normal bilaterally  IX,X: gag  reflex present  XI: Shoulder shrug absent on the left XII: midline tongue extension without atrophy or fasciculations  Motor:  RUE 3, RLE 1-2, LUE INCR TONE 1, LLE INCR TONE 1 Sensory: DECR ON LEFT SIDE   Gait: not tested--non ambulatory at baseline.    ASSESSMENT [name] is a 73 y.o. male presenting with speech difficulties and right hemiparesis.  TPA therapy was not initiated as the patient was on Coumadin. MRI - acute nonhemorrhagic infarct involving the left coronal radiata. Infarct felt to be embolic secondary to atrial fibrillation. On warfarin prior to admission. Now on warfarin for secondary stroke prevention. Patient with resultant speech difficulties and  right hemiparesis. Stroke work up underway.   Atrial fibrillation - anticoagulated with warfarin - subtherapeutic level on admission  Previous strokes  Hyperlipidemia - cholesterol 152 LDL 86 - Zocor prior to admission  Hypertension  Coronary artery disease  Gabapentin and Depakote prior to admission  Altered mental status possibly due to medication effect. The morphine has been discontinued. Repeat CT scan unremarkable. A Depakote level will be obtained.    Hospital day # 4  TREATMENT/PLAN  On warfarin for secondary stroke prevention. Consider newer agents with more predictable anticoagulation.  Skilled nursing facility recommended  EEG as above  Repeat head 10/03/2013 CT showed no definite progression of infarct.  Stroke team will sign off at this time.  Recommend restarting gabapentin and Depakote  Followup Dr. Leonie Man in 2 months.  The patient is still n.p.o. PEG feeding tube will need to be considered.   Gary Bussing PA-C [facility] Pager [phone] 10/05/2013, 8:28 AM I have examined patient, reviewed chart,  Developed  Plan of care and agree with above  Antony Contras, Md      Please refer to [email or web address] for on-call Stroke MD   To contact Stroke Continuity provider,  please refer to [email or web address] After hours, contact General Neurology

## 2013-10-05 NOTE — Progress Notes (Signed)
Patient Demographics  Gary Reynolds, is a 73 y.o. male, DOB - Jan 04, 1941, [id number]  Admit date - 10/01/2013   Admitting Physician Thurnell Lose, MD  Outpatient Primary MD for the patient is No primary provider on file.  LOS - 4   Chief Complaint  Patient presents with  . Weakness        Assessment & Plan    1. Decreased mental status. Due to acute ischemic left cornea radiata CVA. Likely embolic from his heart, he has underlying A. fib and his INR was subtherapeutic with a CHADVAS 2 score of at least 4 - continue to monitor on telemetry bed, reviewed MRI MRA of the brain, head CT unremarkable, INR is now therapeutic we will continue Coumadin. Hep gtt discontinued, pharmacy monitoring INR and Coumadin dose. Neuro following. Initial EEG negative on 10/01/2013.   Have noted echo gram-carotid duplex, I do not think TEE will be of any benefit as patient is already anticoagulated we'll defer to neurology. Being followed by PT-OT-speech eval. currently main issue remains a dysphagia and lethargy. Any further antiplatelet medication per neurology.     2. Atrial fibrillation. Currently in rate control, as needed IV Lopressor, INR currently therapeutic, Heparin/Coumadin as needed. Currently n.p.o.     3. History of CAD. Stable EKG and 3 sets of troponin, as needed beta blocker IV as he is n.p.o., sublingual nitroglycerin when necessary.     4. Left arm pain. Improved with stable x-ray of left humerus.      5. Hypertension. No acute issues, unable to oral medications we'll place on hydralazine as needed.     6. GERD. Continue PPI.      7. Dyslipidemia. Resume home dose statin once taking oral diet.       8. Redness in both eyes. Per son chronic redness in left eye. We'll place  on Cipro eye drops and monitor.     9. Continued dysphagia. Discussed with speech therapist, requested them to see the patient daily, currently n.p.o., discussed with patient and son Buddy Bry on 10/03/2013, 10/05/13  about NG tube he would like to hold off on it for the next few days, as it is uncomfortable, will continue IV fluids for now and monitor. Will repeat chest x-ray on 10/05/2013 to rule out microaspiration, Q2 hour oral suctioning ordered.      Code Status: Full    Family Communication: detailed with Luvenia Redden on 10/01/2013 bedside, with his son Vonna Drafts over the phone on 10/02/2013 8:30 AM, left messages for Buddy again on 10/02/2013 at 7 PM, 10/03/2048 at 9 AM on phone and then 10 AM in person, left message for Buddy Mungin again 10/04/2013 at 10:15 AM, 10/05/13 @ 9.30 am.     Disposition Plan:  Rehab    Procedures MRI-MRA Brain, TTE, Carotid, EEG   Consults  Neuro   Medications  Scheduled Meds: . ciprofloxacin  2 drop Both Eyes Q4H while awake  . Warfarin - Pharmacist Dosing Inpatient   Does not apply q1800   Continuous Infusions: . sodium chloride     PRN Meds:.acetaminophen, fentaNYL, hydrALAZINE, metoprolol, nitroGLYCERIN, senna-docusate  DVT Prophylaxis  Heparin - Coumadin  Lab Results  Component Value Date   PLT 203 10/05/2013  Lab Results  Component Value Date   INR 2.78* 10/05/2013   INR 2.52* 10/04/2013   INR 2.23* 10/03/2013     Antibiotics     Anti-infectives   None          Subjective:   [name] today has, No headache, No chest pain, No abdominal pain - No Nausea, No new weakness tingling or numbness, No Cough - SOB. Remains lethargic.   Objective:   Filed Vitals:   10/04/13 1759 10/04/13 2105 10/05/13 0221 10/05/13 0348  BP: 160/103 150/97 174/151 148/78  Pulse: 97 93 113 93  Temp: 98.2 F (36.8 C) 98.8 F (37.1 C) 98.5 F (36.9 C) 98.1 F (36.7 C)  TempSrc: Oral Oral Oral Oral  Resp: 18 18 18 18   Height:        Weight:      SpO2: 99% 95% 95% 96%    Wt Readings from Last 3 Encounters:  10/01/13 98.4 kg (216 lb 14.9 oz)     Intake/Output Summary (Last 24 hours) at 10/05/13 0945 Last data filed at 10/05/13 0900  Gross per 24 hour  Intake      0 ml  Output      0 ml  Net      0 ml     Physical Exam  Awake -still slightly weak and lethargic, Oriented X 2, No new F.N deficits, chronic left-sided dense hemiparesis with left arm is 0/5, left foot 1/5, Normal affect , weak cough reflex [place].AT,PERRAL, L eye slightly red (some chronic redness) improving. Supple Neck,No JVD, No cervical lymphadenopathy appriciated.  Symmetrical Chest wall movement, Good air movement bilaterally, CTAB RRR,No Gallops,Rubs or new Murmurs, No Parasternal Heave +ve B.Sounds, Abd Soft, Non tender, No organomegaly appriciated, No rebound - guarding or rigidity. No Cyanosis, Clubbing or edema, No new Rash or bruise       Data Review   Micro Results No results found for this or any previous visit (from the past 240 hour(s)).  Radiology Reports Dg Chest 2 View  10/01/2013   CLINICAL DATA:  Weakness.  EXAM: CHEST  2 VIEW  COMPARISON:  PA and lateral chest 02/20/2007.  FINDINGS: The lungs are clear. Heart size is normal. There is no pneumothorax or pleural effusion. No focal bony abnormality.  IMPRESSION: No acute disease.   Electronically Signed   By: Inge Rise M.D.   On: 10/01/2013 10:28   Ct Head Wo Contrast  10/01/2013   CLINICAL DATA:  Weakness.  Lethargy.  EXAM: CT HEAD WITHOUT CONTRAST  TECHNIQUE: Contiguous axial images were obtained from the base of the skull through the vertex without intravenous contrast.  COMPARISON:  04/04/2007  FINDINGS: There is no acute intracranial hemorrhage, infarction, or mass lesion. There is an old partially calcified stroke in the right basal ganglia. There is extensive periventricular white matter lucency, worse on the right than the left, consistent with chronic ischemic  changes. There is a small old right cerebellar infarct. There is diffuse atrophy with secondary slight ventricular dilatation.  No acute osseous abnormalities. Dense calcification in the distal vertebral arteries.  IMPRESSION: No acute intracranial abnormality. Atrophy with old strokes and chronic small vessel ischemic changes. Atrophy has slightly progressed since the prior study.   Electronically Signed   By: Rozetta Nunnery M.D.   On: 10/01/2013 11:07   Mr Brain Wo Contrast  10/01/2013   CLINICAL DATA:  Increased weakness and lethargy a. question stroke.  EXAM: MRI HEAD WITHOUT CONTRAST  TECHNIQUE: Multiplanar,  multiecho pulse sequences of the brain and surrounding structures were obtained without intravenous contrast.  COMPARISON:  CT head from the same day.  FINDINGS: The diffusion-weighted images demonstrate an acute non hemorrhagic infarct involving the left coronal radiata measuring 1.5 cm. No other acute ischemia is evident.  Remote infarcts of the right basal ganglia and frontal operculum are noted. Atrophy and extensive white matter disease is present bilaterally. T2 changes are associated with the a acute infarct. Remote hemorrhages noted within the right basal ganglia and operculum air infarcts. There is associated ex vacuo dilation of the right lateral ventricle.  Flow is present in the major intracranial arteries. The globes and orbits are intact. The paranasal sinuses and mastoid air cells are clear.  IMPRESSION: 1. Acute nonhemorrhagic infarct involving the left coronal radiata. 2. Remote hemorrhagic infarcts of the right basal ganglia and frontal operculum. 3. Atrophy and diffuse white matter disease likely reflects the sequelae of chronic microvascular ischemia.   Electronically Signed   By: Lawrence Santiago M.D.   On: 10/01/2013 17:01   Dg Humerus Left  10/01/2013   CLINICAL DATA:  Left arm pain.  EXAM: LEFT HUMERUS - 2+ VIEW  COMPARISON:  None.  FINDINGS: Bones are osteopenic. No fracture or  focal suspicious bony abnormality is identified. No discrete soft tissue swelling. There are degenerative changes of the acromioclavicular joint.  IMPRESSION: Decreased bony mineralization.  No acute bony abnormality.   Electronically Signed   By: Curlene Dolphin M.D.   On: 10/01/2013 14:41    Carotids  - The vertebral arteries appear patent with antegrade flow. - Findings consistent with1- 39 percent stenosis involving the right internal carotid artery and the left internal carotid artery. - ICA/CCA ratio. right = 0.74. left = 0.52   Echo (TTE)  - Procedure narrative: Transthoracic echocardiography. Image quality was poor. The study was technically difficult, as a result of poor acoustic windows and poor sound wave transmission. - Left ventricle: The cavity size was normal. Wall thickness was normal. Systolic function was normal. The estimated ejection fraction was in the range of 60% to 65%. Regional wall motion abnormalities cannot be excluded. - Mitral valve: Calcified annulus. - Left atrium: The atrium was mildly dilated.  Impressions:  - Technically difficult; LV function appears to be preserved; focal wall motion abnormality cannot be excluded; if clinically indicated, TEE would have better sensitivity for source of embolus.   CBC  Recent Labs Lab 10/01/13 0932 10/02/13 0510 10/03/13 0450 10/04/13 0515 10/05/13 0452  WBC 9.6 9.3 10.4 9.9 11.0*  HGB 14.7 15.1 14.9 14.6 14.2  HCT 43.3 44.9 44.3 43.2 42.2  PLT 173 211 205 187 203  MCV 91.9 91.4 91.3 91.7 91.3  MCH 31.2 30.8 30.7 31.0 30.7  MCHC 33.9 33.6 33.6 33.8 33.6  RDW 13.4 13.2 13.4 13.4 13.3  LYMPHSABS 2.8  --   --   --   --   MONOABS 0.8  --   --   --   --   EOSABS 0.2  --   --   --   --   BASOSABS 0.0  --   --   --   --     Chemistries   Recent Labs Lab 10/01/13 0932 10/04/13 0515  NA 141 143  K 4.4 3.9  CL 105 105  CO2 22 19  GLUCOSE 85 110*  BUN 15 18  CREATININE 0.77 0.71  CALCIUM 9.5 9.3  MG   --  1.9  AST 21  --  ALT 17  --   ALKPHOS 70  --   BILITOT 0.3  --    ------------------------------------------------------------------------------------------------------------------ estimated creatinine clearance is 102.5 ml/min (by C-G formula based on Cr of 0.71). ------------------------------------------------------------------------------------------------------------------ No results found for this basename: HGBA1C,  in the last 72 hours ------------------------------------------------------------------------------------------------------------------ No results found for this basename: CHOL, HDL, LDLCALC, TRIG, CHOLHDL, LDLDIRECT,  in the last 72 hours ------------------------------------------------------------------------------------------------------------------ No results found for this basename: TSH, T4TOTAL, FREET3, T3FREE, THYROIDAB,  in the last 72 hours ------------------------------------------------------------------------------------------------------------------ No results found for this basename: VITAMINB12, FOLATE, FERRITIN, TIBC, IRON, RETICCTPCT,  in the last 72 hours  Coagulation profile  Recent Labs Lab 10/01/13 0932 10/02/13 0737 10/03/13 0450 10/04/13 0515 10/05/13 0452  INR 1.80* 1.82* 2.23* 2.52* 2.78*    No results found for this basename: DDIMER,  in the last 72 hours  Cardiac Enzymes  Recent Labs Lab 10/01/13 1332 10/01/13 2052 10/02/13 0148  TROPONINI <0.30 <0.30 <0.30   ------------------------------------------------------------------------------------------------------------------ No components found with this basename: POCBNP,      Time Spent in minutes   35   Thurnell Lose M.D on 10/05/2013 at 9:45 AM  Between 7am to 7pm - Pager - [phone]  After 7pm go to [email or web address] - password [email or web address]  And look for the night coverage person covering for me after hours  [facility] Office  [phone]

## 2013-10-05 NOTE — Progress Notes (Signed)
Arrived to insert PICC however family not wanting to have it done.  Primary RN was aware and had just notified the MD just prior to my arrival.

## 2013-10-05 NOTE — Progress Notes (Signed)
Speech Language Pathology Treatment: Dysphagia  Patient Details Name: Gary Reynolds MRN: [id number] DOB: 1941/04/05 Today's Date: 10/05/2013 Time: 1010-1055 SLP Time Calculation (min): 45 min  Assessment / Plan / Recommendation Clinical Impression  Treatment focused on dysphagia/respiratory and phonatory treatment with son, Gary Reynolds at bedside.  Pt. required max verbal/visual/tactile cues in attempts to achieve increased respiratory inhalation and exhalation, increased airway protection and volitional throat clear and cough.  Pt. demonstrates poor endurance with significant difficulty coordinating inhalation and exhalation and/or sustaining either for > 1 second. Exacerbating difficulties is his cognitive impairment.  Pt. Able to initiate 2-3 dry volitional swallows.  Continued education for pt.son and provided "homework" (dry swallows, inhalation/exhalation using paper towel, throat clears/coughs).  Encouraged pt. To consider short term [place]anda to provide nutrition since last po's was 5/6.  ST will follow up tomorrow for readiness for repeat MBS.   HPI HPI: 73 y.o. 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 Chest x-ray, UA, head CT, EKG were unremarkable. MRI acute nonhemorrhagic infarct involving the left coronal radiata, remote hemorrhagic infarcts of the right basal ganglia and frontal operculum.  BSE, MBS completed with recommendations for npo due to moderate oral and moderately severe pharyngeal dysphagia.  SLP visit to determine readiness for dietary advancement.     Pertinent Vitals WDL  SLP Plan  Continue with current plan of care    Recommendations Diet recommendations: NPO Medication Administration: Via alternative means              Oral Care Recommendations: Oral care Q4 per protocol Follow up Recommendations: Skilled Nursing  facility Plan: Continue with current plan of care    GO     Houston Siren M.Ed [employer] [phone]  10/05/2013

## 2013-10-06 ENCOUNTER — Inpatient Hospital Stay (HOSPITAL_COMMUNITY): Payer: Medicare Other

## 2013-10-06 LAB — BASIC METABOLIC PANEL
BUN: 20 mg/dL (ref 6–23)
CO2: 19 mEq/L (ref 19–32)
CREATININE: 0.69 mg/dL (ref 0.50–1.35)
Calcium: 9.2 mg/dL (ref 8.4–10.5)
Chloride: 111 mEq/L (ref 96–112)
GFR calc Af Amer: >90 mL/min (ref >90)
Glucose, Bld: 119 mg/dL — ABNORMAL HIGH (ref 70–99)
Potassium: 3.5 mEq/L — ABNORMAL LOW (ref 3.7–5.3)
Sodium: 147 mEq/L (ref 137–147)

## 2013-10-06 LAB — CBC
HEMATOCRIT: 42.7 % (ref 39.0–52.0)
Hemoglobin: 14.7 g/dL (ref 13.0–17.0)
MCH: 31.5 pg (ref 26.0–34.0)
MCHC: 34.4 g/dL (ref 30.0–36.0)
MCV: 91.4 fL (ref 78.0–100.0)
Platelets: 201 10*3/uL (ref 150–400)
RBC: 4.67 MIL/uL (ref 4.22–5.81)
RDW: 13.5 % (ref 11.5–15.5)
WBC: 14.6 10*3/uL — ABNORMAL HIGH (ref 4.0–10.5)

## 2013-10-06 LAB — PROTIME-INR
INR: 2.52 — ABNORMAL HIGH (ref 0.00–1.49)
PROTHROMBIN TIME: 26.3 s — AB (ref 11.6–15.2)

## 2013-10-06 MED ORDER — PIPERACILLIN-TAZOBACTAM 3.375 G IVPB
3.3750 g | Freq: Three times a day (TID) | INTRAVENOUS | Status: DC
  Administered 2013-10-06 – 2013-10-12 (×19): 3.375 g via INTRAVENOUS
  Filled 2013-10-06 (×21): qty 50

## 2013-10-06 MED ORDER — MORPHINE SULFATE 2 MG/ML IJ SOLN
1.0000 mg | INTRAMUSCULAR | Status: DC | PRN
  Administered 2013-10-06 – 2013-10-11 (×17): 1 mg via INTRAVENOUS
  Filled 2013-10-06 (×17): qty 1

## 2013-10-06 MED ORDER — MORPHINE SULFATE 2 MG/ML IJ SOLN
2.0000 mg | Freq: Once | INTRAMUSCULAR | Status: AC
  Administered 2013-10-06: 2 mg via INTRAVENOUS
  Filled 2013-10-06: qty 1

## 2013-10-06 MED ORDER — FENTANYL CITRATE 0.05 MG/ML IJ SOLN
25.0000 ug | INTRAMUSCULAR | Status: DC | PRN
  Administered 2013-10-07 – 2013-10-12 (×10): 25 ug via INTRAVENOUS
  Filled 2013-10-06 (×10): qty 2

## 2013-10-06 MED ORDER — KETOROLAC TROMETHAMINE 30 MG/ML IJ SOLN
30.0000 mg | Freq: Once | INTRAMUSCULAR | Status: AC
  Administered 2013-10-06: 30 mg via INTRAVENOUS
  Filled 2013-10-06: qty 1

## 2013-10-06 MED ORDER — SODIUM CHLORIDE 0.9 % IV SOLN: INTRAVENOUS | Status: DC

## 2013-10-06 MED ORDER — CLINDAMYCIN PHOSPHATE 600 MG/50ML IV SOLN
600.0000 mg | Freq: Three times a day (TID) | INTRAVENOUS | Status: DC
  Administered 2013-10-06 – 2013-10-07 (×2): 600 mg via INTRAVENOUS
  Filled 2013-10-06 (×3): qty 50

## 2013-10-06 MED ORDER — POTASSIUM CHLORIDE IN NACL 20-0.9 MEQ/L-% IV SOLN
INTRAVENOUS | Status: DC
  Administered 2013-10-06: 13:00:00 via INTRAVENOUS
  Administered 2013-10-07: 1000 mL via INTRAVENOUS
  Filled 2013-10-06 (×3): qty 1000

## 2013-10-06 MED ORDER — VANCOMYCIN HCL IN DEXTROSE 1-5 GM/200ML-% IV SOLN
1000.0000 mg | Freq: Two times a day (BID) | INTRAVENOUS | Status: DC
  Administered 2013-10-07 – 2013-10-12 (×11): 1000 mg via INTRAVENOUS
  Filled 2013-10-06 (×12): qty 200

## 2013-10-06 MED ORDER — SODIUM CHLORIDE 0.9 % IJ SOLN
10.0000 mL | INTRAMUSCULAR | Status: DC | PRN
  Administered 2013-10-06 – 2013-10-10 (×5): 10 mL

## 2013-10-06 MED ORDER — MORPHINE SULFATE 2 MG/ML IJ SOLN
INTRAMUSCULAR | Status: AC
  Administered 2013-10-06: 1 mg via INTRAVENOUS
  Filled 2013-10-06: qty 1

## 2013-10-06 MED ORDER — FUROSEMIDE 10 MG/ML IJ SOLN
10.0000 mg | Freq: Once | INTRAMUSCULAR | Status: AC
  Administered 2013-10-06: 10 mg via INTRAVENOUS
  Filled 2013-10-06: qty 1

## 2013-10-06 MED ORDER — VANCOMYCIN HCL 10 G IV SOLR
1500.0000 mg | Freq: Once | INTRAVENOUS | Status: AC
  Administered 2013-10-06: 1500 mg via INTRAVENOUS
  Filled 2013-10-06: qty 1500

## 2013-10-06 MED ORDER — HYDRALAZINE HCL 20 MG/ML IJ SOLN
10.0000 mg | Freq: Four times a day (QID) | INTRAMUSCULAR | Status: DC | PRN
  Administered 2013-10-06 – 2013-10-12 (×2): 10 mg via INTRAVENOUS
  Filled 2013-10-06 (×2): qty 1

## 2013-10-06 NOTE — Progress Notes (Signed)
PT Cancellation Note  Patient Details Name: Gabrielle Mester MRN: [id number] DOB: [birth year]   Cancelled Treatment:    Reason Eval/Treat Not Completed: Patient at procedure or test/unavailable.  PT came by at 10 am and pt was having a PICC line placed.  PT came by a second time at 11:11 and pt was out of room at a test.  PT will check back later as time allows.    Thanks,    Barbarann Ehlers. [place], [place], DPT [phone]   10/06/2013, 11:19 AM

## 2013-10-06 NOTE — Progress Notes (Signed)
IR PA aware of request for image guided percutaneous gastrostomy tube placement. INR 2.52 today, will need the INR < 1.5 prior to G-tube placement. If started on elquis will need to be held 48 hours prior. IR will follow labs and work up at appropriate time.   Tsosie Billing PA-C Interventional Radiology  10/06/13  1:51 PM

## 2013-10-06 NOTE — Progress Notes (Signed)
Shift event: Pt desatted at one point earlier in shift. O2 per [place] increased and since O2 sats have been fine. No increased WOB or resp distress at that time. Pt has severe dysphagia and failed barium swallow/SLP exam today. Given risk of aspiration, NP ordered CXR which showed new infiltrate. Pt on Zosyn and Vanc already for PNA. However, given "new" infiltrate and likely earlier aspiration, will add Cleocin to abx regimen. Clance Boll, NP [facility]

## 2013-10-06 NOTE — Progress Notes (Signed)
Patient O2 was 85. Notified MD on call and was advised to put him on 3L and keep 02 above 92. Burnell Blanks, RN

## 2013-10-06 NOTE — Progress Notes (Signed)
Covering CSW to remain following.  Hunt Oris, MSW, [place]

## 2013-10-06 NOTE — Progress Notes (Signed)
Patient complaining of tailbone pain all day. Patient has been medicated and repositioned. Will continue to monitor and assess patients pain level. Burnell Blanks, RN

## 2013-10-06 NOTE — Progress Notes (Signed)
Patient blood pressure 156/93. Gave PRN Hydralazine. Burnell Blanks, RN

## 2013-10-06 NOTE — Clinical Documentation Improvement (Signed)
  Chart states multiple times "Decreased mental status. Due to acute ischemic left cornea radiata CVA". In the Coding world "decreased mental status" is considered nonspecific and low weighted. If possible, please clarify this term to illustrate the severity of illness and risk of mortality of this patient. Thank you.  Possible Clinical Conditions?  - Encephalopathy (describe type if known)                       Hepatic                       Hypertensive                       Metabolic                       Toxic - Poisoning / Overdose - Other Condition  Thank You, Ezekiel Ina ,RN Clinical Documentation Specialist:  [phone]  [place] Information Management

## 2013-10-06 NOTE — Progress Notes (Signed)
10/06/2013 1320 Medicare IM given, placed on chart. Son completed Medicare IM.  Jonnie Finner RN CCM Case Mgmt phone [phone]

## 2013-10-06 NOTE — Progress Notes (Signed)
ANTIBIOTIC CONSULT NOTE - INITIAL  Pharmacy Consult for vancomycin, Zosyn Indication: rule out pneumonia  Allergies  Allergen Reactions  . Codeine Other (See Comments)    Unknown reaction (per MAR)   . Food Other (See Comments)    Seafood - unknown reaction  (per MAR)  . Shellfish Allergy Other (See Comments)    Unknown reaction (per MAR)  . Sulfa Antibiotics Other (See Comments)    Unknown reaction (per MAR)  . Wellbutrin [Bupropion] Other (See Comments)    Unknown reaction (per [facility])    Patient Measurements: Height: 6\' 4"  (193 cm) Weight: 216 lb 14.9 oz (98.4 kg) IBW/kg (Calculated) : 86.8 Vital Signs: Temp: 99 F (37.2 C) (05/12 1053) Temp src: Axillary (05/12 1053) BP: 154/89 mmHg (05/12 1053) Pulse Rate: 94 (05/12 1053) Intake/Output from previous day: 05/11 0701 - 05/12 0700 In: 0  Out: 400 [Urine:400] Intake/Output from this shift: Total I/O In: -  Out: 550 [Urine:550]  Labs:  Recent Labs  10/04/13 0515 10/05/13 0452 10/06/13 0615 10/06/13 1105  WBC 9.9 11.0*  --  14.6*  HGB 14.6 14.2  --  14.7  PLT 187 203  --  201  CREATININE 0.71  --  0.69  --    Estimated Creatinine Clearance: 102.5 ml/min (by C-G formula based on Cr of 0.69).  Microbiology: No results found for this or any previous visit (from the past 720 hour(s)).  Medical History: Past Medical History  Diagnosis Date  . Stroke   . Hypertension   . Coronary artery disease   . Atrial fibrillation   . GERD 11/25/2006    Qualifier: Diagnosis of  By: Leanne Chang MD, Bruce    . HYPERLIPIDEMIA 11/25/2006    Qualifier: Diagnosis of  By: Leanne Chang MD, Bruce     Medications:  Anti-infectives   None     Assessment: 72 YOM admitted 10/01/13 with weakness and AMS now presenting with new leukocytosis and low-grade fever in setting of dysphagia to start empiric vancomycin and Zosyn for r/o HAP versus aspiration pneumonia. Current SCr is stable with estimated CrCl >100 mL/min. Patient is making urine but  counts are not accurate. Allergies have been reviewed.   Goal of Therapy:  Vancomycin trough level 15-20 mcg/ml  Plan:  1. Zosyn 3.375g IV q8h- each dose over 4 hours. 2. Vancomycin 1500mg  IV x1, then 1g IV q12h.  3. Monitor cultures and the ability to narrow for aspiration alone if HAP is ruled out.  4. Monitor renal function and adjust therapy as needed. Vancomycin trough at steady state if continued.   Sloan Leiter, PharmD, BCPS Clinical Pharmacist [phone] 10/06/2013,5:00 PM

## 2013-10-06 NOTE — Progress Notes (Signed)
Patient hart rate 140-142 gave 5 mg Lopressor, SpO2 stable at 92-93, temp down to 99 from 101, will continue to monitor.

## 2013-10-06 NOTE — Progress Notes (Signed)
Patients pain not controled by Fentanyl BP was upper 160 SB and over 100 DB, patient was crying out called [facility] they wrote for IV morphine 2 mg and Toradol 30 mg and this reduced BP as evidenced by decreased pain and calling out by patient, will continue to monitor.

## 2013-10-06 NOTE — Progress Notes (Signed)
Speech Language Pathology Treatment: Dysphagia  Patient Details Name: Gary Reynolds MRN: [id number] DOB: [birth year] Today's Date: 10/06/2013 Time: 1015-1030 SLP Time Calculation (min): 15 min  Assessment / Plan / Recommendation Clinical Impression  Repeat MBS pending:        Discussed pt status with family.  Decision made to proceed with MBS today, as pt continues nutritionally unsupported.  Will complete MBS @ 1130 10/06/13. Family encouraged to come with pt if desired   HPI HPI: 73 y.o. 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 Chest x-ray, UA, head CT, EKG were unremarkable. MRI acute nonhemorrhagic infarct involving the left coronal radiata, remote hemorrhagic infarcts of the right basal ganglia and frontal operculum.  BSE, MBS completed with recommendations for npo due to moderate oral and moderately severe pharyngeal dysphagia.  SLP visit to determine readiness for dietary advancement.     Pertinent Vitals VSS  SLP Plan  MBS    Recommendations                Plan: MBS    GO    Gary Reynolds [facility], Gary Reynolds  Gary Reynolds 10/06/2013, 10:38 AM

## 2013-10-06 NOTE — Progress Notes (Signed)
[place] for apixaban once INR < 2 Indication: non-valvular Afib  Patient Measurements: Height: 6\' 4"  (193 cm) Weight: 216 lb 14.9 oz (98.4 kg) IBW/kg (Calculated) : 86.8 Heparin Dosing Weight: 86kg  Vital Signs: Temp: 98.3 F (36.8 C) (05/12 0600) Temp src: Oral (05/12 0600) BP: 148/94 mmHg (05/12 0600) Pulse Rate: 95 (05/12 0600)  Labs:  Recent Labs  10/04/13 0515 10/05/13 0452 10/06/13 0615  HGB 14.6 14.2  --   HCT 43.2 42.2  --   PLT 187 203  --   LABPROT 26.3* 28.4* 26.3*  INR 2.52* 2.78* 2.52*  CREATININE 0.71  --  0.69     Assessment: 73 year old male admitted 10/01/2013   with increased weakness, lethargy and altered mental status. 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 Wheelchair bound from nursing facility. Pharmacy consulted to start heparin and warfarin initially.  Now pharmacy consulted to start apixaban once INR < 2.     Unable to take warfarin/apixaban due to being NPO.   Heparin dced by Dr. Candiss Norse 5/9 for therapeutic INR. Currently NPO due to dysphagia and no tube. To have barium swallow study today.  INR therapeutic at 2.52  today after no coumadin x 4 days.  CBC wnl, no bleeding noted.  Creat 0.69.  Wt 98 kg.   PTA warfarin dose: 2 mg Mon, Wed, Fri, 3 mg on all other days   Plan:  Daily INR No apixaban today as INR 2.52 and pt is NPO Once able to take PO meds and once INR < 2, dose will be apixaban 5 mg po BID.  Thank you for allowing pharmacy to be a part of this patients care team. Eudelia Bunch, Pharm.D. [phone] 10/06/2013 10:13 AM

## 2013-10-06 NOTE — Progress Notes (Addendum)
Patient Demographics  Gary Reynolds, is a 73 y.o. male, DOB - [birth year], IRW:[id number]  Admit date - 10/01/2013   Admitting Physician Thurnell Lose, MD  Outpatient Primary MD for the patient is No primary provider on file.  LOS - 5   Chief Complaint  Patient presents with  . Weakness        Assessment & Plan    1. Decreased mental status. Due to acute ischemic left cornea radiata CVA. Likely embolic from his heart, he has underlying A. fib and his INR was subtherapeutic with a CHADVAS 2 score of at least 4 - continue to monitor on telemetry bed, reviewed MRI MRA of the brain, head CT unremarkable, INR is now therapeutic, discussed with stroke neurologist Dr. Leonie Man on 10/06/2013, once INR is below 2 okay to switch him to Eliquis also discussed with patient's son Buddy on 10/06/2013 who agrees with it . Initial EEG negative on 10/01/2013.   Have noted echo gram-carotid duplex.    2. Atrial fibrillation. Currently in rate control, as needed IV Lopressor, INR currently therapeutic, once INR below 2 will be switched to Eliquis per neuro.     3. History of CAD. Stable EKG and 3 sets of troponin, as needed beta blocker IV as he is n.p.o., sublingual nitroglycerin when necessary.     4. Left arm pain. Improved with stable x-ray of left humerus.      5. Hypertension. No acute issues, unable to oral medications we'll place on hydralazine as needed.     6. GERD. Continue PPI.      7. Dyslipidemia. Resume home dose statin once taking oral diet.       8. Redness in both eyes. Per son chronic redness in left eye. We'll place on Cipro eye drops and monitor.     9. Continued dysphagia remains at risk for aspiration. Discussed with speech therapist, requested them to see the patient  daily, currently n.p.o., discussed with patient and son Buddy Broman on 10/03/2013, 10/05/13  about NG tube he would like to hold off on it for the next few days, as it is uncomfortable, he may consider PEG tube if needed, will continue IV fluids for now and monitor. Due for a swallow eval with a modified barium on 10/06/2013.   Repeat chest x-ray on 10/05/2013 to rule out microaspiration shows a possible left lower lobe infiltrate versus atelectasis, he's not febrile, will check a CBC, and monitor, Q2 hour oral suctioning ordered.      Code Status: Full    Family Communication: detailed with Luvenia Redden on 10/01/2013 bedside, with his son Vonna Drafts over the phone on 10/02/2013 8:30 AM, left messages for Buddy again on 10/02/2013 at 7 PM, 10/03/2048 at 9 AM on phone and then 10 AM in person, left message for Buddy Pulliam again 10/04/2013 at 10:15 AM, 10/05/13 @ 9.30 am, 10/06/2013 @ 9:45 AM.     Disposition Plan:  Rehab    Procedures MRI-MRA Brain, TTE, Carotid, EEG, PICC line 10/06/2013, modified barium 10/06/2013.   Consults  Neuro   Medications  Scheduled Meds: . ciprofloxacin  2 drop Both Eyes Q4H while awake  . furosemide  10 mg Intravenous Once   Continuous Infusions: . sodium chloride  PRN Meds:.acetaminophen, fentaNYL, hydrALAZINE, metoprolol, nitroGLYCERIN, senna-docusate  DVT Prophylaxis  Coumadin, once INR below 2 switch to Eliquis  Lab Results  Component Value Date   PLT 203 10/05/2013   Lab Results  Component Value Date   INR 2.52* 10/06/2013   INR 2.78* 10/05/2013   INR 2.52* 10/04/2013     Antibiotics     Anti-infectives   None          Subjective:   [name] today has, No headache, No chest pain, No abdominal pain - No Nausea, No new weakness tingling or numbness, No Cough - SOB. Remains lethargic slightly improved as compared to 10/05/2013.   Objective:   Filed Vitals:   10/05/13 2134 10/05/13 2341 10/06/13 0147 10/06/13 0600  BP:  164/108 165/110 148/99 148/94  Pulse:   84 95  Temp:   97.6 F (36.4 C) 98.3 F (36.8 C)  TempSrc:   Oral Oral  Resp:   18 18  Height:      Weight:      SpO2:   93% 96%    Wt Readings from Last 3 Encounters:  10/01/13 98.4 kg (216 lb 14.9 oz)    No intake or output data in the 24 hours ending 10/06/13 0956   Physical Exam  Awake -still slightly weak and lethargic, Oriented X 2, No new F.N deficits, chronic left-sided dense hemiparesis with left arm is 0/5, left foot 1/5, Normal affect ,weak cough reflex [place].AT,PERRAL, L eye slightly red (some chronic redness) improving. Supple Neck,No JVD, No cervical lymphadenopathy appriciated.  Symmetrical Chest wall movement, Good air movement bilaterally, mildly coarse breath sounds RRR,No Gallops,Rubs or new Murmurs, No Parasternal Heave +ve B.Sounds, Abd Soft, Non tender, No organomegaly appriciated, No rebound - guarding or rigidity. No Cyanosis, Clubbing or edema, No new Rash or bruise       Data Review   Micro Results No results found for this or any previous visit (from the past 240 hour(s)).  Radiology Reports Dg Chest 2 View  10/01/2013   CLINICAL DATA:  Weakness.  EXAM: CHEST  2 VIEW  COMPARISON:  PA and lateral chest 02/20/2007.  FINDINGS: The lungs are clear. Heart size is normal. There is no pneumothorax or pleural effusion. No focal bony abnormality.  IMPRESSION: No acute disease.   Electronically Signed   By: Inge Rise M.D.   On: 10/01/2013 10:28   Ct Head Wo Contrast  10/01/2013   CLINICAL DATA:  Weakness.  Lethargy.  EXAM: CT HEAD WITHOUT CONTRAST  TECHNIQUE: Contiguous axial images were obtained from the base of the skull through the vertex without intravenous contrast.  COMPARISON:  04/04/2007  FINDINGS: There is no acute intracranial hemorrhage, infarction, or mass lesion. There is an old partially calcified stroke in the right basal ganglia. There is extensive periventricular white matter lucency, worse on the  right than the left, consistent with chronic ischemic changes. There is a small old right cerebellar infarct. There is diffuse atrophy with secondary slight ventricular dilatation.  No acute osseous abnormalities. Dense calcification in the distal vertebral arteries.  IMPRESSION: No acute intracranial abnormality. Atrophy with old strokes and chronic small vessel ischemic changes. Atrophy has slightly progressed since the prior study.   Electronically Signed   By: Rozetta Nunnery M.D.   On: 10/01/2013 11:07   Mr Brain Wo Contrast  10/01/2013   CLINICAL DATA:  Increased weakness and lethargy a. question stroke.  EXAM: MRI HEAD WITHOUT CONTRAST  TECHNIQUE: Multiplanar, multiecho pulse sequences  of the brain and surrounding structures were obtained without intravenous contrast.  COMPARISON:  CT head from the same day.  FINDINGS: The diffusion-weighted images demonstrate an acute non hemorrhagic infarct involving the left coronal radiata measuring 1.5 cm. No other acute ischemia is evident.  Remote infarcts of the right basal ganglia and frontal operculum are noted. Atrophy and extensive white matter disease is present bilaterally. T2 changes are associated with the a acute infarct. Remote hemorrhages noted within the right basal ganglia and operculum air infarcts. There is associated ex vacuo dilation of the right lateral ventricle.  Flow is present in the major intracranial arteries. The globes and orbits are intact. The paranasal sinuses and mastoid air cells are clear.  IMPRESSION: 1. Acute nonhemorrhagic infarct involving the left coronal radiata. 2. Remote hemorrhagic infarcts of the right basal ganglia and frontal operculum. 3. Atrophy and diffuse white matter disease likely reflects the sequelae of chronic microvascular ischemia.   Electronically Signed   By: Lawrence Santiago M.D.   On: 10/01/2013 17:01   Dg Humerus Left  10/01/2013   CLINICAL DATA:  Left arm pain.  EXAM: LEFT HUMERUS - 2+ VIEW  COMPARISON:   None.  FINDINGS: Bones are osteopenic. No fracture or focal suspicious bony abnormality is identified. No discrete soft tissue swelling. There are degenerative changes of the acromioclavicular joint.  IMPRESSION: Decreased bony mineralization.  No acute bony abnormality.   Electronically Signed   By: Curlene Dolphin M.D.   On: 10/01/2013 14:41    Carotids  - The vertebral arteries appear patent with antegrade flow. - Findings consistent with1- 39 percent stenosis involving the right internal carotid artery and the left internal carotid artery. - ICA/CCA ratio. right = 0.74. left = 0.52   Echo (TTE)  - Procedure narrative: Transthoracic echocardiography. Image quality was poor. The study was technically difficult, as a result of poor acoustic windows and poor sound wave transmission. - Left ventricle: The cavity size was normal. Wall thickness was normal. Systolic function was normal. The estimated ejection fraction was in the range of 60% to 65%. Regional wall motion abnormalities cannot be excluded. - Mitral valve: Calcified annulus. - Left atrium: The atrium was mildly dilated.  Impressions:  - Technically difficult; LV function appears to be preserved; focal wall motion abnormality cannot be excluded; if clinically indicated, TEE would have better sensitivity for source of embolus.   CBC  Recent Labs Lab 10/01/13 0932 10/02/13 0510 10/03/13 0450 10/04/13 0515 10/05/13 0452  WBC 9.6 9.3 10.4 9.9 11.0*  HGB 14.7 15.1 14.9 14.6 14.2  HCT 43.3 44.9 44.3 43.2 42.2  PLT 173 211 205 187 203  MCV 91.9 91.4 91.3 91.7 91.3  MCH 31.2 30.8 30.7 31.0 30.7  MCHC 33.9 33.6 33.6 33.8 33.6  RDW 13.4 13.2 13.4 13.4 13.3  LYMPHSABS 2.8  --   --   --   --   MONOABS 0.8  --   --   --   --   EOSABS 0.2  --   --   --   --   BASOSABS 0.0  --   --   --   --     Chemistries   Recent Labs Lab 10/01/13 0932 10/04/13 0515 10/06/13 0615  NA 141 143 147  K 4.4 3.9 3.5*  CL 105 105 111  CO2 22  19 19   GLUCOSE 85 110* 119*  BUN 15 18 20   CREATININE 0.77 0.71 0.69  CALCIUM 9.5 9.3 9.2  MG  --  1.9  --   AST 21  --   --   ALT 17  --   --   ALKPHOS 70  --   --   BILITOT 0.3  --   --    ------------------------------------------------------------------------------------------------------------------ estimated creatinine clearance is 102.5 ml/min (by C-G formula based on Cr of 0.69). ------------------------------------------------------------------------------------------------------------------ No results found for this basename: HGBA1C,  in the last 72 hours ------------------------------------------------------------------------------------------------------------------ No results found for this basename: CHOL, HDL, LDLCALC, TRIG, CHOLHDL, LDLDIRECT,  in the last 72 hours ------------------------------------------------------------------------------------------------------------------ No results found for this basename: TSH, T4TOTAL, FREET3, T3FREE, THYROIDAB,  in the last 72 hours ------------------------------------------------------------------------------------------------------------------ No results found for this basename: VITAMINB12, FOLATE, FERRITIN, TIBC, IRON, RETICCTPCT,  in the last 72 hours  Coagulation profile  Recent Labs Lab 10/02/13 0737 10/03/13 0450 10/04/13 0515 10/05/13 0452 10/06/13 0615  INR 1.82* 2.23* 2.52* 2.78* 2.52*    No results found for this basename: DDIMER,  in the last 72 hours  Cardiac Enzymes  Recent Labs Lab 10/01/13 1332 10/01/13 2052 10/02/13 0148  TROPONINI <0.30 <0.30 <0.30   ------------------------------------------------------------------------------------------------------------------ No components found with this basename: POCBNP,      Time Spent in minutes   35   Thurnell Lose M.D on 10/06/2013 at 9:56 AM  Between 7am to 7pm - Pager - [phone]  After 7pm go to [email or web address] - password [email or web address]  And  look for the night coverage person covering for me after hours  [facility] Office  [phone]

## 2013-10-06 NOTE — Progress Notes (Signed)
OT Cancellation Note  Patient Details Name: Gary Reynolds MRN: [id number] DOB: [birth year]   Cancelled Treatment:    Reason Eval/Treat Not Completed: Patient at procedure or test/ unavailable - will reattempt  [place], OTR/L [phone] 10/06/2013, 12:20 PM

## 2013-10-06 NOTE — Progress Notes (Signed)
Physical Therapy Treatment Patient Details Name: Gary Reynolds MRN: [id number] DOB: 1941-05-17 Today's Date: 10/06/2013    History of Present Illness 73 y.o. 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 MRI revealed acute ischemic left cornea radiata CVA.    PT Comments    Worked with pt today EOB.  Needed total assist to get to seated EOB.  We attempted OOB to recliner chair, but pt was too painful and after ~10 mins of repositioning and placing pillows for comfort, I had to lift him back to bed as his c/o "my tailbone hurts" became more frequent and urgent.  Son was present for the last portion of my session.  PT will continue to follow acutely to help progress mobility.    Follow Up Recommendations  SNF     Equipment Recommendations  None recommended by PT    Recommendations for Other Services   NA     Precautions / Restrictions Precautions Precautions: Fall Precaution Comments: Lt side hemiplegia     Mobility  Bed Mobility Overal bed mobility: Needs Assistance Bed Mobility: Rolling;Sit to Supine;Supine to Sit Rolling: Total assist   Supine to sit: Total assist Sit to supine: Total assist   General bed mobility comments: Total assist to support trunk and move bil legs for transitions to EOB and back to supine.  Pt not initiating movement to help.  He did not resist movement, though either. HOB maximally rasied and used bed pad to progress hips.    Transfers Overall transfer level: Needs assistance               General transfer comment: Maxi move total lift used to get him OOB to recliner and then back in the bed ~10 mins later as he seemed to be unable due to pain to tolerate sitting up in the recliner chair even with maximal pillows and several repositioning attempts.     Ambulation/Gait             General Gait Details: Unable x 7 years       Modified Rankin (Stroke Patients Only) Modified Rankin (Stroke Patients Only) Pre-Morbid Rankin Score: Severe disability Modified Rankin: Severe disability     Balance Overall balance assessment: Needs assistance Sitting-balance support: Single extremity supported;Feet supported Sitting balance-Leahy Scale: Poor Sitting balance - Comments: Once positioned well EOB, pt required mod assist to maintain sitting balance.  He was unable to maintain balance without my support, but he did not lean and he did not push in resistance in sitting.  We worked on following one step commands on the right side, upright posture and attempting to hold his head upright in sitting.                              Cognition Arousal/Alertness: Awake/alert Behavior During Therapy: Restless;Flat affect Overall Cognitive Status: History of cognitive impairments - at baseline Area of Impairment: Orientation;Attention;Memory;Following commands;Safety/judgement;Awareness;Problem solving Orientation Level: Disoriented to;Place;Time;Situation Current Attention Level: Focused Memory: Decreased short-term memory Following Commands: Follows one step commands inconsistently Safety/Judgement: Decreased awareness of deficits;Decreased awareness of safety Awareness: Intellectual Problem Solving: Slow processing;Decreased initiation;Difficulty sequencing;Requires verbal cues;Requires tactile cues      Exercises General Exercises - Lower Extremity Ankle Circles/Pumps: PROM;Both;5 reps;Supine Heel Slides: PROM;Both;10 reps;Supine Hip ABduction/ADduction: PROM;Both;10 reps;Supine  Pertinent Vitals/Pain See vitals flow sheet.     Home Living Family/patient expects to be discharged to:: Skilled nursing facility     Type of Home: [place]         Additional Comments: Pt was not active with PT at  SNF, but family wishes that he becomes active now that he has had a new event.      Prior Function Level of Independence: Needs assistance  Gait / Transfers Assistance Needed: Pt was a hoyer lift to WC.  He was unable to propel the [facility] per his son's report.  ADL's / Homemaking Assistance Needed: Total A with Bathing, dressing, and toileting.  He was able to feed self with Rt. UE and possibly was able to perform some basic grooming - son was unsure of this.      PT Goals (current goals can now be found in the care plan section) Acute Rehab PT Goals Patient Stated Goal: family would like PT reinstated at SNF.   Progress towards PT goals: Progressing toward goals    Frequency  Min 2X/week    PT Plan Current plan remains appropriate       End of Session   Activity Tolerance: Patient limited by pain Patient left: in bed;with call bell/phone within reach;with family/visitor present     Time: 8676-1950 PT Time Calculation (min): 82 min  Charges:  $Therapeutic Exercise: 8-22 mins $Therapeutic Activity: 53-67 mins                     Deja Kaigler B. [place], [place], DPT [phone]   10/06/2013, 4:19 PM

## 2013-10-06 NOTE — Progress Notes (Signed)
Peripherally Inserted Central Catheter/Midline Placement  The IV Nurse has discussed with the patient and/or persons authorized to consent for the patient, the purpose of this procedure and the potential benefits and risks involved with this procedure.  The benefits include less needle sticks, lab draws from the catheter and patient may be discharged home with the catheter.  Risks include, but not limited to, infection, bleeding, blood clot (thrombus formation), and puncture of an artery; nerve damage and irregular heat beat.  Alternatives to this procedure were also discussed.  PICC/Midline Placement Documentation  PICC / Midline Double Lumen 16/10/96 PICC Right Basilic 40 cm 0 cm (Active)  Indication for Insertion or Continuance of Line Limited venous access - need for IV therapy >5 days (PICC only) 10/06/2013 10:26 AM  Exposed Catheter (cm) 0 cm 10/06/2013 10:26 AM  Dressing Change Due 10/13/13 10/06/2013 10:26 AM       Gary Reynolds 10/06/2013, 10:28 AM

## 2013-10-06 NOTE — Procedures (Signed)
Objective Swallowing Evaluation: Modified Barium Swallowing Study  Patient Details  Name: Gary Reynolds MRN: [id number] Date of Birth: [birth year]  Today's Date: 10/06/2013 Time: 1200-1230 SLP Time Calculation (min): 30 min  Past Medical History:  Past Medical History  Diagnosis Date  . Stroke   . Hypertension   . Coronary artery disease   . Atrial fibrillation   . GERD 11/25/2006    Qualifier: Diagnosis of  By: Leanne Chang MD, Bruce    . HYPERLIPIDEMIA 11/25/2006    Qualifier: Diagnosis of  By: Leanne Chang MD, Bruce     Past Surgical History: No past surgical history on file. HPI:  73 y.o. 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 Chest x-ray, UA, head CT, EKG were unremarkable. MRI acute nonhemorrhagic infarct involving the left coronal radiata, remote hemorrhagic infarcts of the right basal ganglia and frontal operculum.  Repeat MBS today to determine appropriateness for po intake.       Assessment / Plan / Recommendation Clinical Impression  Dysphagia Diagnosis: Severe pharyngeal phase dysphagia;Moderate oral phase dysphagia Clinical impression: Pt continues to demonstrate significant sensory and motor based oropharyngeal dysphagia.   Pt exhibits oral holding, poor bolus formation and reduced posterior propulsion.  Swallow response is significantly delayed, and could  be considered essentially absent, as  pt's vallecular and pyriform sinuses were noted to fill with un-swallowed barium without triggering the swallow reflex.  Maximum visual, verbal and tactile cues were unsuccessful in achieving swallow response, cough, or throat clear.  Bolus sizes were < 1 tsp in size during this study.  No airway penetration or aspiration was observed, however, pt required suction to remove barium in the pharynx when study terminated.  Aspiration undoubtedly  would have occurred with continued or larger boluses, as when swallow reflex did trigger (3 times throughout entire study), minimal laryngeal movement was observed.  Pt had significant difficulty consistently following commands to clear throat or cough. NPO status is recommended to continue, with thorough oral care several times a day.  Family was present during the MBS, and was encouraged to try strengthening exercises with pt as pt tolerates.  Son reported that the decision had been made to place PEG tube for nutritional support.  Aspiration risk with any consistency continues to be very high     Treatment Recommendation  Therapy as outlined in treatment plan below    Diet Recommendation NPO;Alternative means - long-term   Medication Administration: Via alternative means    Other  Recommendations Oral Care Recommendations: Oral care Q4 per protocol   Follow Up Recommendations  Skilled Nursing facility    Frequency and Duration min 2x/week  2 weeks   Pertinent Vitals/Pain VSS, pt reporting his "tailbone" hurting    SLP Swallow Goals  see plan of care   General Date of Onset: 10/01/13 HPI: 73 y.o. 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 Chest x-ray, UA, head CT, EKG were unremarkable. MRI acute nonhemorrhagic infarct involving the left coronal radiata, remote hemorrhagic infarcts of the right basal ganglia and frontal operculum.  Repeat MBS today to determine appropriateness for po intake.   Type of Study: Modified Barium Swallowing Study Reason for Referral: Objectively evaluate swallowing function Previous Swallow Assessment: MBS 10/02/13 Diet Prior to this Study: NPO Temperature Spikes Noted:  No Respiratory Status: Room air History of Recent Intubation: No Behavior/Cognition: Alert;Cooperative;Distractible;Requires  cueing;Doesn't follow directions Oral Cavity - Dentition: Adequate natural dentition Oral Motor / Sensory Function: Impaired - see Bedside swallow eval Self-Feeding Abilities: Total assist Patient Positioning: Upright in chair Baseline Vocal Quality: Clear;Low vocal intensity Volitional Cough: Cognitively unable to elicit Volitional Swallow: Unable to elicit Anatomy: Within functional limits Pharyngeal Secretions: Not observed secondary MBS    Reason for Referral Objectively evaluate swallowing function   Oral Phase Oral Preparation/Oral Phase Oral Phase: Impaired Oral - Honey Oral - Honey Teaspoon: Weak lingual manipulation;Holding of bolus;Delayed oral transit;Reduced posterior propulsion Oral - Nectar Oral - Nectar Teaspoon: Weak lingual manipulation;Holding of bolus;Delayed oral transit;Reduced posterior propulsion Oral - Thin Oral - Thin Teaspoon: Weak lingual manipulation;Holding of bolus;Delayed oral transit;Reduced posterior propulsion;Lingual pumping Oral - Solids Oral - Puree: Not tested   Pharyngeal Phase Pharyngeal Phase Pharyngeal Phase: Impaired Pharyngeal - Honey Pharyngeal - Honey Teaspoon: Delayed swallow initiation;Premature spillage to valleculae;Premature spillage to pyriform sinuses;Reduced laryngeal elevation;Reduced anterior laryngeal mobility;Reduced epiglottic inversion;Reduced pharyngeal peristalsis;Reduced tongue base retraction;Pharyngeal residue - valleculae;Pharyngeal residue - pyriform sinuses;Lateral channel residue Pharyngeal - Nectar Pharyngeal - Nectar Teaspoon: Delayed swallow initiation;Premature spillage to pyriform sinuses;Premature spillage to valleculae;Reduced pharyngeal peristalsis;Reduced epiglottic inversion;Reduced anterior laryngeal mobility;Reduced laryngeal elevation;Reduced tongue base retraction;Pharyngeal residue - valleculae;Pharyngeal residue - pyriform sinuses;Lateral channel residue Pharyngeal - Thin Pharyngeal - Thin Teaspoon:  Delayed swallow initiation;Premature spillage to valleculae;Premature spillage to pyriform sinuses;Reduced pharyngeal peristalsis;Reduced epiglottic inversion;Reduced anterior laryngeal mobility;Reduced laryngeal elevation;Reduced tongue base retraction;Pharyngeal residue - valleculae;Pharyngeal residue - pyriform sinuses;Lateral channel residue Pharyngeal - Solids Pharyngeal - Puree: Not tested;Premature spillage to pyriform sinuses  Cervical Esophageal Phase    GO    Cervical Esophageal Phase Cervical Esophageal Phase: [facility]        [name] Quentin [facility], [place] [phone]  [name] 10/06/2013, 1:31 PM

## 2013-10-07 LAB — CBC
HCT: 43.2 % (ref 39.0–52.0)
Hemoglobin: 14.8 g/dL (ref 13.0–17.0)
MCH: 31 pg (ref 26.0–34.0)
MCHC: 34.3 g/dL (ref 30.0–36.0)
MCV: 90.4 fL (ref 78.0–100.0)
Platelets: 187 10*3/uL (ref 150–400)
RBC: 4.78 MIL/uL (ref 4.22–5.81)
RDW: 13.5 % (ref 11.5–15.5)
WBC: 11.6 10*3/uL — AB (ref 4.0–10.5)

## 2013-10-07 LAB — BASIC METABOLIC PANEL
BUN: 18 mg/dL (ref 6–23)
CALCIUM: 9 mg/dL (ref 8.4–10.5)
CHLORIDE: 108 meq/L (ref 96–112)
CO2: 22 mEq/L (ref 19–32)
Creatinine, Ser: 0.71 mg/dL (ref 0.50–1.35)
GFR calc Af Amer: >90 mL/min (ref >90)
Glucose, Bld: 145 mg/dL — ABNORMAL HIGH (ref 70–99)
Potassium: 3 mEq/L — ABNORMAL LOW (ref 3.7–5.3)
Sodium: 147 mEq/L (ref 137–147)

## 2013-10-07 LAB — PROTIME-INR
INR: 2.91 — ABNORMAL HIGH (ref 0.00–1.49)
INR: 1.89 — AB (ref 0.00–1.49)
Prothrombin Time: 29.4 seconds — ABNORMAL HIGH (ref 11.6–15.2)
Prothrombin Time: 21.1 seconds — ABNORMAL HIGH (ref 11.6–15.2)

## 2013-10-07 LAB — MAGNESIUM: MAGNESIUM: 2.1 mg/dL (ref 1.5–2.5)

## 2013-10-07 MED ORDER — HEPARIN (PORCINE) IN NACL 100-0.45 UNIT/ML-% IJ SOLN
1400.0000 [IU]/h | INTRAMUSCULAR | Status: DC
  Administered 2013-10-07: 1200 [IU]/h via INTRAVENOUS
  Administered 2013-10-08 (×2): 1400 [IU]/h via INTRAVENOUS
  Filled 2013-10-07 (×5): qty 250

## 2013-10-07 MED ORDER — DEXTROSE 5 % IV SOLN
2.0000 mg | Freq: Once | INTRAVENOUS | Status: AC
  Administered 2013-10-07: 2 mg via INTRAVENOUS
  Filled 2013-10-07: qty 0.2

## 2013-10-07 MED ORDER — POTASSIUM CHLORIDE 10 MEQ/100ML IV SOLN
10.0000 meq | INTRAVENOUS | Status: AC
  Administered 2013-10-07 (×4): 10 meq via INTRAVENOUS
  Filled 2013-10-07 (×4): qty 100

## 2013-10-07 MED ORDER — VITAMIN K1 10 MG/ML IJ SOLN: 1.0000 mg | Freq: Once | INTRAVENOUS | Status: DC

## 2013-10-07 MED ORDER — SODIUM CHLORIDE 0.9 % IV SOLN
INTRAVENOUS | Status: AC
  Administered 2013-10-07: 10:00:00 via INTRAVENOUS

## 2013-10-07 NOTE — Progress Notes (Signed)
Patient ID: Gary Reynolds, male   DOB: 04-30-1941, 73 y.o.   MRN: [id number]    Awaiting timing for percutaneous Gastric tube placement as per MD request  INR up to 2.9 today. Must be 1.5 or lower to proceed safely.

## 2013-10-07 NOTE — Progress Notes (Signed)
Patient Demographics  Gary Reynolds, is a 73 y.o. male, DOB - 1941-01-27, EVO:[id number]  Admit date - 10/01/2013   Admitting Physician Thurnell Lose, MD  Outpatient Primary MD for the patient is No primary provider on file.  LOS - 6   Chief Complaint  Patient presents with  . Weakness        Assessment & Plan    1. Decreased mental status. Due to acute ischemic left cornea radiata CVA. Likely embolic from his heart, he has underlying A. fib and his INR was subtherapeutic with a CHADVAS 2 score of at least 4 - continue to monitor on telemetry bed, reviewed MRI MRA of the brain, head CT unremarkable, INR is now therapeutic, discussed with stroke neurologist Dr. Leonie Man on 10/06/2013, once INR is below 2 will be bridged by heparin without bolus and then okay to switch him to Eliquis also discussed with patient's son Buddy on 10/07/2013 who agrees with it . Initial EEG negative on 10/01/2013.   Have noted echo gram-carotid duplex.    2. Atrial fibrillation. Currently in rate control, as needed IV Lopressor, INR currently therapeutic, once INR below 2, heparin bridge without bolus till PEG tube placed and then will be switched to Eliquis per neuro.   Son understands the risks and benefits of vitamin K with possibility of subtherapeutic INR and stroke, he wants to get INR down as soon as possible for a PEG tube to give his status chance of getting adequate nutrition and fighting of microaspiration and his sickness.    3. History of CAD. Stable EKG and 3 sets of troponin, as needed beta blocker IV as he is n.p.o., sublingual nitroglycerin when necessary.     4. Left arm pain. Improved with stable x-ray of left humerus.      5. Hypertension. No acute issues, unable to oral medications we'll place  on hydralazine as needed.     6. GERD. Continue PPI.      7. Dyslipidemia. Resume home dose statin once taking oral diet.       8. Redness in both eyes. Per son chronic redness in left eye. We'll place on Cipro eye drops and monitor.     9. Continued dysphagia remains at risk for continued mite to aspiration. Clinically now appears to be developing HCAP, continue head of the bed elevation, oral suctioning and NT suctioning, continues to be n.p.o., explained to son Vonna Drafts again in detail on 10/07/2013 that microaspiration cannot be completely prevented.  He should has failed swallow test, somebody and patient now okay for PEG tube placement, INR is rising despite him not being on Coumadin for the last 3 days as he is n.p.o., likely old Coumadin still working with no vitamin K in the system due to n.p.o. status. Son eager to get a PEG tube placed early, 2 mg of IV white woman K. will be given on 10/07/2013, we'll be checking INR every 12 hours x2, once INR below 2 Will bridge with heparin without bolus. IR has been consulted for PEG tube placement.    10.HCAP - due to #9, aspiration precautions as above, oral and NT suctioning, empiric IV antibiotics. Monitor.     11. Hypokalemia. Replaced IV, recheck.  Code Status: Full    Family Communication: detailed with Loa Socks on 10/01/2013 bedside, with his son Elnita Maxwell over the phone on 10/02/2013 8:30 AM, left messages for Buddy again on 10/02/2013 at 7 PM, 10/03/2048 at 9 AM on phone and then 10 AM in person, left message for Buddy Musa again 10/04/2013 at 10:15 AM, 10/05/13 @ 9.30 am, 10/06/2013 @ 9:45 AM. Elnita Maxwell on phone 10/07/2013 at 8:45 AM.     Disposition Plan:  Rehab    Procedures MRI-MRA Brain, TTE, Carotid, EEG, PICC line 10/06/2013, modified barium 10/06/2013.   Consults  Neuro   Medications  Scheduled Meds: . ciprofloxacin  2 drop Both Eyes Q4H while awake  . phytonadione (VITAMIN K) IV  2 mg  Intravenous Once  . piperacillin-tazobactam (ZOSYN)  IV  3.375 g Intravenous 3 times per day  . potassium chloride  10 mEq Intravenous Q1 Hr x 4  . vancomycin  1,000 mg Intravenous Q12H   Continuous Infusions: . sodium chloride     PRN Meds:.acetaminophen, fentaNYL, hydrALAZINE, metoprolol, morphine injection, nitroGLYCERIN, senna-docusate, sodium chloride  DVT Prophylaxis  Coumadin, once INR below 2 switch to Eliquis  Lab Results  Component Value Date   PLT 187 10/07/2013   Lab Results  Component Value Date   INR 2.91* 10/07/2013   INR 2.52* 10/06/2013   INR 2.78* 10/05/2013     Antibiotics     Anti-infectives   Start     Dose/Rate Route Frequency Ordered Stop   10/07/13 0500  vancomycin (VANCOCIN) IVPB 1000 mg/200 mL premix     1,000 mg 200 mL/hr over 60 Minutes Intravenous Every 12 hours 10/06/13 1708     10/06/13 2330  clindamycin (CLEOCIN) IVPB 600 mg  Status:  Discontinued     600 mg 100 mL/hr over 30 Minutes Intravenous 3 times per day 10/06/13 2324 10/07/13 0616   10/06/13 1715  piperacillin-tazobactam (ZOSYN) IVPB 3.375 g     3.375 g 12.5 mL/hr over 240 Minutes Intravenous 3 times per day 10/06/13 1708     10/06/13 1715  vancomycin (VANCOCIN) 1,500 mg in sodium chloride 0.9 % 500 mL IVPB     1,500 mg 250 mL/hr over 120 Minutes Intravenous  Once 10/06/13 1708 10/06/13 2108          Subjective:   [facility] today has, No headache, No chest pain, No abdominal pain - No Nausea, No new weakness tingling or numbness, No Cough - SOB. Less lethargic today, speech slightly improved as compared to 10/07/2013.   Objective:   Filed Vitals:   10/06/13 2003 10/07/13 0000 10/07/13 0400 10/07/13 0526  BP: 144/85 132/91 162/107 136/87  Pulse: 112 92 91 89  Temp: 99.3 F (37.4 C) 97.9 F (36.6 C) 97.8 F (36.6 C) 98.9 F (37.2 C)  TempSrc: Oral Oral Oral Axillary  Resp: 18 36 30 28  Height:      Weight:      SpO2: 93% 94% 94% 99%    Wt Readings from Last 3  Encounters:  10/01/13 98.4 kg (216 lb 14.9 oz)     Intake/Output Summary (Last 24 hours) at 10/07/13 0946 Last data filed at 10/07/13 0400  Gross per 24 hour  Intake 1218.75 ml  Output    550 ml  Net 668.75 ml     Physical Exam  Awake -still slightly weak but less lethargic, Oriented X 2, No new F.N deficits, chronic left-sided dense hemiparesis with left arm is 0/5, left foot 1/5, Normal affect ,weak  cough reflex [place].AT,PERRAL, L eye slightly red (some chronic redness) improving. Supple Neck,No JVD, No cervical lymphadenopathy appriciated.  Symmetrical Chest wall movement, Good air movement bilaterally, mildly coarse breath sounds RRR,No Gallops,Rubs or new Murmurs, No Parasternal Heave +ve B.Sounds, Abd Soft, Non tender, No organomegaly appriciated, No rebound - guarding or rigidity. No Cyanosis, Clubbing or edema, No new Rash or bruise       Data Review   Micro Results No results found for this or any previous visit (from the past 240 hour(s)).  Radiology Reports Dg Chest 2 View  10/01/2013   CLINICAL DATA:  Weakness.  EXAM: CHEST  2 VIEW  COMPARISON:  PA and lateral chest 02/20/2007.  FINDINGS: The lungs are clear. Heart size is normal. There is no pneumothorax or pleural effusion. No focal bony abnormality.  IMPRESSION: No acute disease.   Electronically Signed   By: Inge Rise M.D.   On: 10/01/2013 10:28   Ct Head Wo Contrast  10/01/2013   CLINICAL DATA:  Weakness.  Lethargy.  EXAM: CT HEAD WITHOUT CONTRAST  TECHNIQUE: Contiguous axial images were obtained from the base of the skull through the vertex without intravenous contrast.  COMPARISON:  04/04/2007  FINDINGS: There is no acute intracranial hemorrhage, infarction, or mass lesion. There is an old partially calcified stroke in the right basal ganglia. There is extensive periventricular white matter lucency, worse on the right than the left, consistent with chronic ischemic changes. There is a small old right cerebellar  infarct. There is diffuse atrophy with secondary slight ventricular dilatation.  No acute osseous abnormalities. Dense calcification in the distal vertebral arteries.  IMPRESSION: No acute intracranial abnormality. Atrophy with old strokes and chronic small vessel ischemic changes. Atrophy has slightly progressed since the prior study.   Electronically Signed   By: Rozetta Nunnery M.D.   On: 10/01/2013 11:07   Mr Brain Wo Contrast  10/01/2013   CLINICAL DATA:  Increased weakness and lethargy a. question stroke.  EXAM: MRI HEAD WITHOUT CONTRAST  TECHNIQUE: Multiplanar, multiecho pulse sequences of the brain and surrounding structures were obtained without intravenous contrast.  COMPARISON:  CT head from the same day.  FINDINGS: The diffusion-weighted images demonstrate an acute non hemorrhagic infarct involving the left coronal radiata measuring 1.5 cm. No other acute ischemia is evident.  Remote infarcts of the right basal ganglia and frontal operculum are noted. Atrophy and extensive white matter disease is present bilaterally. T2 changes are associated with the a acute infarct. Remote hemorrhages noted within the right basal ganglia and operculum air infarcts. There is associated ex vacuo dilation of the right lateral ventricle.  Flow is present in the major intracranial arteries. The globes and orbits are intact. The paranasal sinuses and mastoid air cells are clear.  IMPRESSION: 1. Acute nonhemorrhagic infarct involving the left coronal radiata. 2. Remote hemorrhagic infarcts of the right basal ganglia and frontal operculum. 3. Atrophy and diffuse white matter disease likely reflects the sequelae of chronic microvascular ischemia.   Electronically Signed   By: Lawrence Santiago M.D.   On: 10/01/2013 17:01   Dg Humerus Left  10/01/2013   CLINICAL DATA:  Left arm pain.  EXAM: LEFT HUMERUS - 2+ VIEW  COMPARISON:  None.  FINDINGS: Bones are osteopenic. No fracture or focal suspicious bony abnormality is identified. No  discrete soft tissue swelling. There are degenerative changes of the acromioclavicular joint.  IMPRESSION: Decreased bony mineralization.  No acute bony abnormality.   Electronically Signed   By: Manuela Schwartz  Turner M.D.   On: 10/01/2013 14:41    Carotids  - The vertebral arteries appear patent with antegrade flow. - Findings consistent with1- 39 percent stenosis involving the right internal carotid artery and the left internal carotid artery. - ICA/CCA ratio. right = 0.74. left = 0.52   Echo (TTE)  - Procedure narrative: Transthoracic echocardiography. Image quality was poor. The study was technically difficult, as a result of poor acoustic windows and poor sound wave transmission. - Left ventricle: The cavity size was normal. Wall thickness was normal. Systolic function was normal. The estimated ejection fraction was in the range of 60% to 65%. Regional wall motion abnormalities cannot be excluded. - Mitral valve: Calcified annulus. - Left atrium: The atrium was mildly dilated.  Impressions:  - Technically difficult; LV function appears to be preserved; focal wall motion abnormality cannot be excluded; if clinically indicated, TEE would have better sensitivity for source of embolus.   CBC  Recent Labs Lab 10/01/13 0932  10/03/13 0450 10/04/13 0515 10/05/13 0452 10/06/13 1105 10/07/13 0553  WBC 9.6  < > 10.4 9.9 11.0* 14.6* 11.6*  HGB 14.7  < > 14.9 14.6 14.2 14.7 14.8  HCT 43.3  < > 44.3 43.2 42.2 42.7 43.2  PLT 173  < > 205 187 203 201 187  MCV 91.9  < > 91.3 91.7 91.3 91.4 90.4  MCH 31.2  < > 30.7 31.0 30.7 31.5 31.0  MCHC 33.9  < > 33.6 33.8 33.6 34.4 34.3  RDW 13.4  < > 13.4 13.4 13.3 13.5 13.5  LYMPHSABS 2.8  --   --   --   --   --   --   MONOABS 0.8  --   --   --   --   --   --   EOSABS 0.2  --   --   --   --   --   --   BASOSABS 0.0  --   --   --   --   --   --   < > = values in this interval not displayed.  Chemistries   Recent Labs Lab 10/01/13 0932  10/04/13 0515 10/06/13 0615 10/07/13 0553  NA 141 143 147 147  K 4.4 3.9 3.5* 3.0*  CL 105 105 111 108  CO2 22 19 19 22   GLUCOSE 85 110* 119* 145*  BUN 15 18 20 18   CREATININE 0.77 0.71 0.69 0.71  CALCIUM 9.5 9.3 9.2 9.0  MG  --  1.9  --  2.1  AST 21  --   --   --   ALT 17  --   --   --   ALKPHOS 70  --   --   --   BILITOT 0.3  --   --   --    ------------------------------------------------------------------------------------------------------------------ estimated creatinine clearance is 102.5 ml/min (by C-G formula based on Cr of 0.71). ------------------------------------------------------------------------------------------------------------------ No results found for this basename: HGBA1C,  in the last 72 hours ------------------------------------------------------------------------------------------------------------------ No results found for this basename: CHOL, HDL, LDLCALC, TRIG, CHOLHDL, LDLDIRECT,  in the last 72 hours ------------------------------------------------------------------------------------------------------------------ No results found for this basename: TSH, T4TOTAL, FREET3, T3FREE, THYROIDAB,  in the last 72 hours ------------------------------------------------------------------------------------------------------------------ No results found for this basename: VITAMINB12, FOLATE, FERRITIN, TIBC, IRON, RETICCTPCT,  in the last 72 hours  Coagulation profile  Recent Labs Lab 10/03/13 0450 10/04/13 0515 10/05/13 0452 10/06/13 0615 10/07/13 0553  INR 2.23* 2.52* 2.78* 2.52* 2.91*    No results found  for this basename: DDIMER,  in the last 72 hours  Cardiac Enzymes  Recent Labs Lab 10/01/13 1332 10/01/13 2052 10/02/13 0148  TROPONINI <0.30 <0.30 <0.30   ------------------------------------------------------------------------------------------------------------------ No components found with this basename: POCBNP,      Time Spent in  minutes   35   Thurnell Lose M.D on 10/07/2013 at 9:46 AM  Between 7am to 7pm - Pager - [phone]  After 7pm go to [email or web address] - password [email or web address]  And look for the night coverage person covering for me after hours  [facility] Office  [phone]

## 2013-10-07 NOTE — Progress Notes (Signed)
ANTICOAGULATION CONSULT NOTE - Initial Consult  Pharmacy Consult for heparin Indication: Heparin bridge for PEG placement  Allergies  Allergen Reactions  . Codeine Other (See Comments)    Unknown reaction (per MAR)   . Food Other (See Comments)    Seafood - unknown reaction  (per MAR)  . Shellfish Allergy Other (See Comments)    Unknown reaction (per MAR)  . Sulfa Antibiotics Other (See Comments)    Unknown reaction (per MAR)  . Wellbutrin [Bupropion] Other (See Comments)    Unknown reaction (per [facility])    Patient Measurements: Height: 6\' 4"  (193 cm) Weight: 216 lb 14.9 oz (98.4 kg) IBW/kg (Calculated) : 86.8 Heparin Dosing Weight:   Vital Signs: Temp: 98.7 F (37.1 C) (05/13 1846) Temp src: Oral (05/13 1846) BP: 136/97 mmHg (05/13 1846) Pulse Rate: 93 (05/13 1846)  Labs:  Recent Labs  10/05/13 0452 10/06/13 0615 10/06/13 1105 10/07/13 0553 10/07/13 1726  HGB 14.2  --  14.7 14.8  --   HCT 42.2  --  42.7 43.2  --   PLT 203  --  201 187  --   LABPROT 28.4* 26.3*  --  29.4* 21.1*  INR 2.78* 2.52*  --  2.91* 1.89*  CREATININE  --  0.69  --  0.71  --     Estimated Creatinine Clearance: 102.5 ml/min (by C-G formula based on Cr of 0.71).   Medical History: Past Medical History  Diagnosis Date  . Stroke   . Hypertension   . Coronary artery disease   . Atrial fibrillation   . GERD 11/25/2006    Qualifier: Diagnosis of  By: Leanne Chang MD, Bruce    . HYPERLIPIDEMIA 11/25/2006    Qualifier: Diagnosis of  By: Leanne Chang MD, Bruce      Medications:  Scheduled:  . ciprofloxacin  2 drop Both Eyes Q4H while awake  . piperacillin-tazobactam (ZOSYN)  IV  3.375 g Intravenous 3 times per day  . vancomycin  1,000 mg Intravenous Q12H    Assessment: 73 yr old male was brought in with HCAP and a need for a PEG tube placement. We have been waiting several days for his INR to drop in order to start heparin for the surgery. The INR is finally <2 and heparin can be started.  Goal of  Therapy:  Heparin level: 0.3-0.7    Plan:  Heparin 1200 units/hr. No bolus. Heparin level and CBC qam while on heparin.   Ardith Dark Mariama Saintvil 10/07/2013,7:32 PM

## 2013-10-07 NOTE — Progress Notes (Signed)
UR complete.  Clorene Nerio RN, MSN 

## 2013-10-08 ENCOUNTER — Inpatient Hospital Stay (HOSPITAL_COMMUNITY): Payer: Medicare Other

## 2013-10-08 ENCOUNTER — Encounter (HOSPITAL_COMMUNITY): Payer: Self-pay | Admitting: Radiology

## 2013-10-08 LAB — HEPARIN LEVEL (UNFRACTIONATED)
HEPARIN UNFRACTIONATED: 0.37 [IU]/mL (ref 0.30–0.70)
Heparin Unfractionated: 0.17 IU/mL — ABNORMAL LOW (ref 0.30–0.70)

## 2013-10-08 LAB — CBC
HCT: 42.4 % (ref 39.0–52.0)
Hemoglobin: 14.5 g/dL (ref 13.0–17.0)
MCH: 31 pg (ref 26.0–34.0)
MCHC: 34.2 g/dL (ref 30.0–36.0)
MCV: 90.6 fL (ref 78.0–100.0)
PLATELETS: 197 10*3/uL (ref 150–400)
RBC: 4.68 MIL/uL (ref 4.22–5.81)
RDW: 13.8 % (ref 11.5–15.5)
WBC: 13.2 10*3/uL — ABNORMAL HIGH (ref 4.0–10.5)

## 2013-10-08 LAB — BASIC METABOLIC PANEL
BUN: 22 mg/dL (ref 6–23)
CALCIUM: 9.4 mg/dL (ref 8.4–10.5)
CO2: 22 mEq/L (ref 19–32)
Chloride: 110 mEq/L (ref 96–112)
Creatinine, Ser: 0.78 mg/dL (ref 0.50–1.35)
GFR calc Af Amer: >90 mL/min (ref >90)
GFR, EST NON AFRICAN AMERICAN: 88 mL/min — AB (ref >90)
Glucose, Bld: 116 mg/dL — ABNORMAL HIGH (ref 70–99)
POTASSIUM: 3 meq/L — AB (ref 3.7–5.3)
Sodium: 149 mEq/L — ABNORMAL HIGH (ref 137–147)

## 2013-10-08 LAB — PROTIME-INR
INR: 1.49 (ref 0.00–1.49)
PROTHROMBIN TIME: 17.6 s — AB (ref 11.6–15.2)

## 2013-10-08 MED ORDER — POTASSIUM CHLORIDE 10 MEQ/100ML IV SOLN
10.0000 meq | INTRAVENOUS | Status: AC
  Administered 2013-10-08 (×6): 10 meq via INTRAVENOUS
  Filled 2013-10-08 (×6): qty 100

## 2013-10-08 MED ORDER — CEFAZOLIN SODIUM-DEXTROSE 2-3 GM-% IV SOLR
2.0000 g | INTRAVENOUS | Status: AC
  Filled 2013-10-08: qty 50

## 2013-10-08 MED ORDER — POTASSIUM CHLORIDE 10 MEQ/100ML IV SOLN: 10.0000 meq | INTRAVENOUS | Status: DC

## 2013-10-08 NOTE — Consult Note (Signed)
Reason for Consult:Dysphagia, request gastrostomy tube Consulting Radiologist: Vernard Gambles Referring Physician: Candiss Norse   HPI: Gary Reynolds is an 73 y.o. male with prior hx of CVA and altered mental status. Has had progressive dysphagia and now is at high risk for aspiration. Family has decided to proceed with gastrostomy tube for alternative means of nutrition. Chart, PMHx, meds reviewed. Possible concern for PNA, pt currently on IV abx but has been afebrile. Coumadin has been held and reversed and pt is currently on heparin drip. He will converted to Eliquis post procedure.  Past Medical History:  Past Medical History  Diagnosis Date  . Stroke   . Hypertension   . Coronary artery disease   . Atrial fibrillation   . GERD 11/25/2006    Qualifier: Diagnosis of  By: Leanne Chang MD, Bruce    . HYPERLIPIDEMIA 11/25/2006    Qualifier: Diagnosis of  By: Leanne Chang MD, Bruce      Surgical History: History reviewed. No pertinent past surgical history.  Family History:  Family History  Problem Relation Age of Onset  . Hyperlipidemia Mother   . Hypertension Mother   . Hypertension Father     Social History:  reports that he has quit smoking. He does not have any smokeless tobacco history on file. He reports that he does not drink alcohol. His drug history is not on file.  Allergies:  Allergies  Allergen Reactions  . Codeine Other (See Comments)    Unknown reaction (per MAR)   . Food Other (See Comments)    Seafood - unknown reaction  (per MAR)  . Shellfish Allergy Other (See Comments)    Unknown reaction (per MAR)  . Sulfa Antibiotics Other (See Comments)    Unknown reaction (per MAR)  . Wellbutrin [Bupropion] Other (See Comments)    Unknown reaction (per [facility])    Medications:  Continuous: . sodium chloride 75 mL/hr at 10/07/13 1001  . heparin 1,400 Units/hr (10/08/13 0419)    ROS: See HPI for pertinent findings, otherwise complete 10 system review negative.  Physical Exam: Blood  pressure 130/92, pulse 90, temperature 98.1 F (36.7 C), temperature source Oral, resp. rate 20, height $RemoveBe'6\' 4"'VWlmfzxIk$  (1.93 m), weight 216 lb 14.9 oz (98.4 kg), SpO2 98.00%. Gen: awake but demented. Does nod when mentioning son "Buddy" ENT: unremarkable airway Lungs: CTA without w/r/r Heart: Regular Abdomen: soft, ND, NT. No surgical scars    Labs: CBC  Recent Labs  10/07/13 0553 10/08/13 0301  WBC 11.6* 13.2*  HGB 14.8 14.5  HCT 43.2 42.4  PLT 187 197   MET  Recent Labs  10/07/13 0553 10/08/13 0301  NA 147 149*  K 3.0* 3.0*  CL 108 110  CO2 22 22  GLUCOSE 145* 116*  BUN 18 22  CREATININE 0.71 0.78  CALCIUM 9.0 9.4   No results found for this basename: PROT, ALBUMIN, AST, ALT, ALKPHOS, BILITOT, BILIDIR, IBILI, LIPASE,  in the last 72 hours PT/INR  Recent Labs  10/07/13 1726 10/08/13 0301  LABPROT 21.1* 17.6*  INR 1.89* 1.49   ABG No results found for this basename: PHART, PCO2, PO2, HCO3,  in the last 72 hours    Dg Chest Port 1 View  10/06/2013   CLINICAL DATA:  Desaturating on 2 L.  EXAM: PORTABLE CHEST - 1 VIEW  COMPARISON:  10/06/2013 at 1100 hr  FINDINGS: Shallow inspiration with elevation of the right hemidiaphragm. Borderline heart size with normal pulmonary vascularity. Suggestion of developing infiltration or atelectasis in the right lung base.  No pneumothorax. No pleural effusions. PICC line is unchanged in position.  IMPRESSION: Suggestion of developing infiltration or atelectasis in the right lung base. Examination is otherwise unchanged.   Electronically Signed   By: Lucienne Capers M.D.   On: 10/06/2013 22:32   Dg Chest Port 1 View  10/06/2013   CLINICAL DATA:  Status post PICC line placement on the right  EXAM: PORTABLE CHEST - 1 VIEW  COMPARISON:  DG CHEST 1V PORT dated 10/05/2013  FINDINGS: The PICC line tip lies in the region of the midportion of the SVC. The lungs are adequately inflated. There is no focal infiltrate. The cardiac silhouette is  top-normal in size. The pulmonary vascularity is not engorged. The trachea is midline. There is no pleural effusion. There are degenerative changes of the left shoulder.  IMPRESSION: There is no evidence of a postprocedure complication following placement of the PICC line on the right.   Electronically Signed   By: David  Martinique   On: 10/06/2013 11:17   Dg Swallowing Func-speech Pathology  10/06/2013   [name], CCC-SLP     10/06/2013  1:32 PM Objective Swallowing Evaluation: Modified Barium Swallowing Study   Patient Details  Name: [name] MRN: [id number] Date of Birth: [birth year]  Today's Date: 10/06/2013 Time: 1200-1230 SLP Time Calculation (min): 30 min  Past Medical History:  Past Medical History  Diagnosis Date  . Stroke   . Hypertension   . Coronary artery disease   . Atrial fibrillation   . GERD 11/25/2006    Qualifier: Diagnosis of  By: Leanne Chang MD, Bruce    . HYPERLIPIDEMIA 11/25/2006    Qualifier: Diagnosis of  By: Leanne Chang MD, Bruce     Past Surgical History: No past surgical history on file. HPI:  73 y.o. 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 Chest x-ray, UA, head CT, EKG were  unremarkable. MRI acute nonhemorrhagic infarct involving the left  coronal radiata, remote hemorrhagic infarcts of the right basal  ganglia and frontal operculum.  Repeat MBS today to determine  appropriateness for po intake.       Assessment / Plan / Recommendation Clinical Impression  Dysphagia Diagnosis: Severe pharyngeal phase dysphagia;Moderate  oral phase dysphagia Clinical impression: Pt continues to demonstrate significant  sensory and motor based oropharyngeal dysphagia.   Pt exhibits  oral holding, poor bolus formation and reduced posterior  propulsion.  Swallow response is significantly delayed, and could   be considered essentially absent, as  pt's  vallecular and  pyriform sinuses were noted to fill with un-swallowed barium  without triggering the swallow reflex.  Maximum visual, verbal  and tactile cues were unsuccessful in achieving swallow response,  cough, or throat clear.  Bolus sizes were < 1 tsp in size during  this study.  No airway penetration or aspiration was observed,  however, pt required suction to remove barium in the pharynx when  study terminated.  Aspiration undoubtedly would have occurred  with continued or larger boluses, as when swallow reflex did  trigger (3 times throughout entire study), minimal laryngeal  movement was observed.  Pt had significant difficulty  consistently following commands to clear throat or cough. NPO  status is recommended to continue, with thorough oral care  several times a day.  Family was present during the MBS, and was  encouraged to try strengthening exercises with  pt as pt  tolerates.  Son reported that the decision had been made to place  PEG tube for nutritional support.  Aspiration risk with any  consistency continues to be very high     Treatment Recommendation  Therapy as outlined in treatment plan below    Diet Recommendation NPO;Alternative means - long-term   Medication Administration: Via alternative means    Other  Recommendations Oral Care Recommendations: Oral care Q4  per protocol   Follow Up Recommendations  Skilled Nursing facility    Frequency and Duration min 2x/week  2 weeks   Pertinent Vitals/Pain VSS, pt reporting his "tailbone" hurting    SLP Swallow Goals  see plan of care   General Date of Onset: 10/01/13 HPI: 73 y.o. 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 Chest x-ray, UA, head CT, EKG  were unremarkable. MRI acute nonhemorrhagic infarct involving the  left coronal radiata, remote hemorrhagic infarcts of the right  basal  ganglia and frontal operculum.  Repeat MBS today to  determine appropriateness for po intake.   Type of Study: Modified Barium Swallowing Study Reason for Referral: Objectively evaluate swallowing function Previous Swallow Assessment: MBS 10/02/13 Diet Prior to this Study: NPO Temperature Spikes Noted: No Respiratory Status: Room air History of Recent Intubation: No Behavior/Cognition: Alert;Cooperative;Distractible;Requires  cueing;Doesn't follow directions Oral Cavity - Dentition: Adequate natural dentition Oral Motor / Sensory Function: Impaired - see Bedside swallow  eval Self-Feeding Abilities: Total assist Patient Positioning: Upright in chair Baseline Vocal Quality: Clear;Low vocal intensity Volitional Cough: Cognitively unable to elicit Volitional Swallow: Unable to elicit Anatomy: Within functional limits Pharyngeal Secretions: Not observed secondary MBS    Reason for Referral Objectively evaluate swallowing function   Oral Phase Oral Preparation/Oral Phase Oral Phase: Impaired Oral - Honey Oral - Honey Teaspoon: Weak lingual manipulation;Holding of  bolus;Delayed oral transit;Reduced posterior propulsion Oral - Nectar Oral - Nectar Teaspoon: Weak lingual manipulation;Holding of  bolus;Delayed oral transit;Reduced posterior propulsion Oral - Thin Oral - Thin Teaspoon: Weak lingual manipulation;Holding of  bolus;Delayed oral transit;Reduced posterior propulsion;Lingual  pumping Oral - Solids Oral - Puree: Not tested   Pharyngeal Phase Pharyngeal Phase Pharyngeal Phase: Impaired Pharyngeal - Honey Pharyngeal - Honey Teaspoon: Delayed swallow initiation;Premature  spillage to valleculae;Premature spillage to pyriform  sinuses;Reduced laryngeal elevation;Reduced anterior laryngeal  mobility;Reduced epiglottic inversion;Reduced pharyngeal  peristalsis;Reduced tongue base retraction;Pharyngeal residue -  valleculae;Pharyngeal residue - pyriform sinuses;Lateral channel  residue Pharyngeal - Nectar Pharyngeal -  Nectar Teaspoon: Delayed swallow  initiation;Premature spillage to pyriform sinuses;Premature  spillage to valleculae;Reduced pharyngeal peristalsis;Reduced  epiglottic inversion;Reduced anterior laryngeal mobility;Reduced  laryngeal elevation;Reduced tongue base retraction;Pharyngeal  residue - valleculae;Pharyngeal residue - pyriform  sinuses;Lateral channel residue Pharyngeal - Thin Pharyngeal - Thin Teaspoon: Delayed swallow initiation;Premature  spillage to valleculae;Premature spillage to pyriform  sinuses;Reduced pharyngeal peristalsis;Reduced epiglottic  inversion;Reduced anterior laryngeal mobility;Reduced laryngeal  elevation;Reduced tongue base retraction;Pharyngeal residue -  valleculae;Pharyngeal residue - pyriform sinuses;Lateral channel  residue Pharyngeal - Solids Pharyngeal - Puree: Not tested;Premature spillage to pyriform  sinuses  Cervical Esophageal Phase    GO    Cervical Esophageal Phase Cervical Esophageal Phase: [facility]        [name] Quentin Ore [facility], CCC-SLP [phone]  [name] 10/06/2013, 1:31 PM     Assessment/Plan: Hx CVA and altered mental status Dysphagia with high risk aspiration Discussed IR placement of  G-tube with son Vonna Drafts who is agreeable. Explained procedure, risks, complications, use of sedation Labs reviewed. Sl elevated WBC, on IV abx, will recheck CXR today Due to asp risk, pt will need temp NGT for administration of barium later today. KUB in am. Will need hold heparin a few hours prior to procedure tmrw once we know approx time. Ascencion Dike PA-C 10/08/2013, 10:13 AM

## 2013-10-08 NOTE — Progress Notes (Signed)
Physical medicine and rehabilitation consult requested. Patient is a resident of [facility] nursing home and plan is to return back to skilled nursing facility. Will hold on formal rehabilitation consult at this time with recommendations for skilled nursing facility

## 2013-10-08 NOTE — Progress Notes (Signed)
Radiology ordered NG placement for barium contrast. Several attempts made at bedside with son present with no success. Ascencion Dike, PA notified and ordered it placed by diagnostics. Tech called and states it will be done in the morning. Oncoming nurse notified.

## 2013-10-08 NOTE — Progress Notes (Signed)
ANTICOAGULATION CONSULT NOTE  Pharmacy Consult heparin Indication: non-valvular Afib  Patient Measurements: Height: 6\' 4"  (193 cm) Weight: 216 lb 14.9 oz (98.4 kg) IBW/kg (Calculated) : 86.8 Heparin Dosing Weight: 86kg  Vital Signs: Temp: 98.1 F (36.7 C) (05/14 0955) Temp src: Oral (05/14 0955) BP: 130/92 mmHg (05/14 0955) Pulse Rate: 90 (05/14 0955)  Labs:  Recent Labs  10/06/13 0615  10/06/13 1105 10/07/13 0553 10/07/13 1726 10/08/13 0301 10/08/13 1115  HGB  --   < > 14.7 14.8  --  14.5  --   HCT  --   --  42.7 43.2  --  42.4  --   PLT  --   --  201 187  --  197  --   LABPROT 26.3*  --   --  29.4* 21.1* 17.6*  --   INR 2.52*  --   --  2.91* 1.89* 1.49  --   HEPARINUNFRC  --   --   --   --   --  0.17* 0.37  CREATININE 0.69  --   --  0.71  --  0.78  --   < > = values in this interval not displayed.   Assessment: 73 year old male admitted 10/01/2013 with increased weakness, lethargy and altered mental status. 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 Wheelchair bound from nursing facility. He was on coumadin PTA.  Pharmacy consulted to start heparin and warfarin initially.  Unable to take warfarin/apixaban due to being NPO.   Heparin dced by Dr. Candiss Norse 5/9 for therapeutic INR then resumed on 5/13 as INR had dropped below 2 after no coumadin x 6 days and vitamin K 2 mg IV given 5/13 at 10 am.  . Currently NPO due to dysphagia and no tube. His heparin level is therapeutic at 0.37 on heparin drip at 1400 units/hr.  CBC stable, no bleeding reported. Plan to place a PEG per IR and transition him to eliquis once PEG placed.   Goal of Therapy:  Heparin level 0.3-0.5 units/ml   Plan:  1. Continue heparin drip at 1400 units/hr 2. Daily HL, CBC.  Will DC daily INR.  3. After PEG placed the apixaban dose will be 5 mg po BID.  Thank you for allowing pharmacy to be a part of this patients care team. Eudelia Bunch, Pharm.D. [phone] 10/08/2013 1:39 PM

## 2013-10-08 NOTE — Progress Notes (Signed)
Patient Demographics  Gary Reynolds, is a 73 y.o. male, DOB - 1940-06-25, [id number]  Admit date - 10/01/2013   Admitting Physician Thurnell Lose, MD  Outpatient Primary MD for the patient is No primary provider on file.  LOS - 7   Chief Complaint  Patient presents with  . Weakness        Assessment & Plan    1. Decreased mental status. Due to acute ischemic left cornea radiata CVA. Likely embolic from his heart, he has underlying A. fib and his INR was subtherapeutic with a CHADVAS 2 score of at least 4 - continue to monitor on telemetry bed, reviewed MRI MRA of the brain, head CT unremarkable, INR is below 2 will be bridged by heparin without bolus and then okay to switch him to Eliquis also discussed with her neurologist Dr. Leonie Man and patient's son Buddy on 10/07/2013 who agrees with it . Initial EEG negative on 10/01/2013. Noted Gary gram-carotid duplex.    2. Atrial fibrillation. Currently in rate control, as needed IV Lopressor, INR below 2, heparin bridge without bolus till PEG tube placed and then will be switched to Eliquis per neuro.   Son understands the risks and benefits of vitamin K with possibility of subtherapeutic INR and stroke, he wants to get INR down as soon as possible for a PEG tube to give his status chance of getting adequate nutrition and fighting of microaspiration and his sickness.    3. History of CAD. Stable EKG and 3 sets of troponin, as needed beta blocker IV as he is n.p.o., sublingual nitroglycerin when necessary.     4. Left arm pain. Improved with stable x-ray of left humerus.      5. Hypertension. No acute issues, unable to oral medications we'll place on hydralazine as needed.     6. GERD. Continue PPI.      7. Dyslipidemia. Resume home  dose statin once taking oral diet.       8. Redness in both eyes. Per son chronic redness in left eye. We'll place on Cipro eye drops and monitor.     9. Continued dysphagia remains at risk for continued mite to aspiration. Clinically now appears to be developing HCAP, continue head of the bed elevation, oral suctioning and NT suctioning, continues to be n.p.o., explained to son Vonna Drafts again in detail on 10/07/2013 that microaspiration cannot be completely prevented.  He should has failed swallow test, somebody and patient now okay for PEG tube placement, INR is rising despite him not being on Coumadin for the last 3 days as he is n.p.o., likely old Coumadin still working with no vitamin K in the system due to n.p.o. status. Son eager to get a PEG tube placed early, 2 mg of IV white woman K. will be given on 10/07/2013, we'll be checking INR every 12 hours x2, once INR below 2 Will bridge with heparin without bolus. IR has been consulted for PEG tube placement.    10.HCAP - due to #9, aspiration precautions as above, oral and NT suctioning, empiric IV antibiotics. Monitor. Will add chest PT through chest vest.     11. Hypokalemia. Replaced IV, recheck again in the morning.      Code Status:  Full    Family Communication: detailed with Luvenia Redden on 10/01/2013 bedside, with his son Vonna Drafts over the phone on 10/02/2013 8:30 AM, left messages for Buddy again on 10/02/2013 at 7 PM, 10/03/2048 at 9 AM on phone and then 10 AM in person, left message for Buddy Mckibbin again 10/04/2013 at 10:15 AM, 10/05/13 @ 9.30 am, 10/06/2013 @ 9:45 AM.   Vonna Drafts on phone 10/07/2013 at 8:45 AM.   Buddy on form will be updated again on 10/08/2013 at 11:40 AM     Disposition Plan:  Rehab/CIR    Procedures MRI-MRA Brain, TTE, Carotid, EEG, PICC line 10/06/2013, modified barium 10/06/2013. PEG tube on 10/09/2013, NG on 10/08/2013   Consults  Neuro   Medications  Scheduled Meds: . [START ON  10/09/2013]  ceFAZolin (ANCEF) IV  2 g Intravenous On Call  . ciprofloxacin  2 drop Both Eyes Q4H while awake  . piperacillin-tazobactam (ZOSYN)  IV  3.375 g Intravenous 3 times per day  . potassium chloride  10 mEq Intravenous Q1 Hr x 6  . vancomycin  1,000 mg Intravenous Q12H   Continuous Infusions: . sodium chloride 75 mL/hr at 10/07/13 1001  . heparin 1,400 Units/hr (10/08/13 0419)   PRN Meds:.acetaminophen, fentaNYL, hydrALAZINE, metoprolol, morphine injection, nitroGLYCERIN, senna-docusate, sodium chloride   DVT Prophylaxis  Coumadin, once INR below 2 switch to Eliquis   Lab Results  Component Value Date   PLT 197 10/08/2013   Lab Results  Component Value Date   INR 1.49 10/08/2013   INR 1.89* 10/07/2013   INR 2.91* 10/07/2013     Antibiotics     Anti-infectives   Start     Dose/Rate Route Frequency Ordered Stop   10/09/13 0700  ceFAZolin (ANCEF) IVPB 2 g/50 mL premix    Comments:  Hang on call to IR procedure   2 g 100 mL/hr over 30 Minutes Intravenous On call 10/08/13 1022 10/10/13 0700   10/07/13 0500  vancomycin (VANCOCIN) IVPB 1000 mg/200 mL premix     1,000 mg 200 mL/hr over 60 Minutes Intravenous Every 12 hours 10/06/13 1708     10/06/13 2330  clindamycin (CLEOCIN) IVPB 600 mg  Status:  Discontinued     600 mg 100 mL/hr over 30 Minutes Intravenous 3 times per day 10/06/13 2324 10/07/13 0616   10/06/13 1715  piperacillin-tazobactam (ZOSYN) IVPB 3.375 g     3.375 g 12.5 mL/hr over 240 Minutes Intravenous 3 times per day 10/06/13 1708     10/06/13 1715  vancomycin (VANCOCIN) 1,500 mg in sodium chloride 0.9 % 500 mL IVPB     1,500 mg 250 mL/hr over 120 Minutes Intravenous  Once 10/06/13 1708 10/06/13 2108          Subjective:   [facility] today has, No headache, No chest pain, No abdominal pain - No Nausea, No new weakness tingling or numbness, No Cough - SOB. Less lethargic today, speech slightly improved again.   Objective:   Filed Vitals:    10/07/13 2138 10/08/13 0200 10/08/13 0554 10/08/13 0955  BP: 105/81 156/89 164/87 130/92  Pulse: 89 102 86 90  Temp: 98.2 F (36.8 C) 98.9 F (37.2 C) 99.4 F (37.4 C) 98.1 F (36.7 C)  TempSrc: Oral Oral Oral Oral  Resp: 18 18 18 20   Height:      Weight:      SpO2: 95% 96% 96% 98%    Wt Readings from Last 3 Encounters:  10/01/13 98.4 kg (216 lb 14.9 oz)  Intake/Output Summary (Last 24 hours) at 10/08/13 1133 Last data filed at 10/08/13 0500  Gross per 24 hour  Intake      0 ml  Output    200 ml  Net   -200 ml     Physical Exam  Awake -still slightly weak but less lethargic, Oriented X 2, No new F.N deficits, chronic left-sided dense hemiparesis with left arm is 0/5, left foot 1/5, Normal affect ,weak cough reflex but has shown some improvement [place].AT,PERRAL, L eye slightly red (some chronic redness) improving. Supple Neck,No JVD, No cervical lymphadenopathy appriciated.  Symmetrical Chest wall movement, Good air movement bilaterally, mildly coarse breath sounds RRR,No Gallops,Rubs or new Murmurs, No Parasternal Heave +ve B.Sounds, Abd Soft, Non tender, No organomegaly appriciated, No rebound - guarding or rigidity. No Cyanosis, Clubbing or edema, No new Rash or bruise       Data Review   Micro Results Recent Results (from the past 240 hour(s))  CULTURE, BLOOD (ROUTINE X 2)     Status: None   Collection Time    10/06/13  9:05 PM      Result Value Ref Range Status   Specimen Description BLOOD LEFT HAND   Final   Special Requests BOTTLES DRAWN AEROBIC ONLY 3CC   Final   Culture  Setup Time     Final   Value: 10/07/2013 00:48     Performed at [facility]   Culture     Final   Value:        BLOOD CULTURE RECEIVED NO GROWTH TO DATE CULTURE WILL BE HELD FOR 5 DAYS BEFORE ISSUING A FINAL NEGATIVE REPORT     Performed at [facility]   Report Status PENDING   Incomplete  CULTURE, BLOOD (ROUTINE X 2)     Status: None   Collection Time    10/06/13   9:10 PM      Result Value Ref Range Status   Specimen Description BLOOD LEFT HAND   Final   Special Requests BOTTLES DRAWN AEROBIC ONLY 3CC   Final   Culture  Setup Time     Final   Value: 10/07/2013 00:48     Performed at [facility]   Culture     Final   Value:        BLOOD CULTURE RECEIVED NO GROWTH TO DATE CULTURE WILL BE HELD FOR 5 DAYS BEFORE ISSUING A FINAL NEGATIVE REPORT     Performed at [facility]   Report Status PENDING   Incomplete    Radiology Reports Dg Chest 2 View  10/01/2013   CLINICAL DATA:  Weakness.  EXAM: CHEST  2 VIEW  COMPARISON:  PA and lateral chest 02/20/2007.  FINDINGS: The lungs are clear. Heart size is normal. There is no pneumothorax or pleural effusion. No focal bony abnormality.  IMPRESSION: No acute disease.   Electronically Signed   By: Inge Rise M.D.   On: 10/01/2013 10:28   Ct Head Wo Contrast  10/01/2013   CLINICAL DATA:  Weakness.  Lethargy.  EXAM: CT HEAD WITHOUT CONTRAST  TECHNIQUE: Contiguous axial images were obtained from the base of the skull through the vertex without intravenous contrast.  COMPARISON:  04/04/2007  FINDINGS: There is no acute intracranial hemorrhage, infarction, or mass lesion. There is an old partially calcified stroke in the right basal ganglia. There is extensive periventricular white matter lucency, worse on the right than the left, consistent with chronic ischemic changes. There is a  small old right cerebellar infarct. There is diffuse atrophy with secondary slight ventricular dilatation.  No acute osseous abnormalities. Dense calcification in the distal vertebral arteries.  IMPRESSION: No acute intracranial abnormality. Atrophy with old strokes and chronic small vessel ischemic changes. Atrophy has slightly progressed since the prior study.   Electronically Signed   By: Rozetta Nunnery M.D.   On: 10/01/2013 11:07   Mr Brain Wo Contrast  10/01/2013   CLINICAL DATA:  Increased weakness and lethargy a. question  stroke.  EXAM: MRI HEAD WITHOUT CONTRAST  TECHNIQUE: Multiplanar, multiecho pulse sequences of the brain and surrounding structures were obtained without intravenous contrast.  COMPARISON:  CT head from the same day.  FINDINGS: The diffusion-weighted images demonstrate an acute non hemorrhagic infarct involving the left coronal radiata measuring 1.5 cm. No other acute ischemia is evident.  Remote infarcts of the right basal ganglia and frontal operculum are noted. Atrophy and extensive white matter disease is present bilaterally. T2 changes are associated with the a acute infarct. Remote hemorrhages noted within the right basal ganglia and operculum air infarcts. There is associated ex vacuo dilation of the right lateral ventricle.  Flow is present in the major intracranial arteries. The globes and orbits are intact. The paranasal sinuses and mastoid air cells are clear.  IMPRESSION: 1. Acute nonhemorrhagic infarct involving the left coronal radiata. 2. Remote hemorrhagic infarcts of the right basal ganglia and frontal operculum. 3. Atrophy and diffuse white matter disease likely reflects the sequelae of chronic microvascular ischemia.   Electronically Signed   By: Lawrence Santiago M.D.   On: 10/01/2013 17:01   Dg Humerus Left  10/01/2013   CLINICAL DATA:  Left arm pain.  EXAM: LEFT HUMERUS - 2+ VIEW  COMPARISON:  None.  FINDINGS: Bones are osteopenic. No fracture or focal suspicious bony abnormality is identified. No discrete soft tissue swelling. There are degenerative changes of the acromioclavicular joint.  IMPRESSION: Decreased bony mineralization.  No acute bony abnormality.   Electronically Signed   By: Curlene Dolphin M.D.   On: 10/01/2013 14:41    Carotids  - The vertebral arteries appear patent with antegrade flow. - Findings consistent with1- 39 percent stenosis involving the right internal carotid artery and the left internal carotid artery. - ICA/CCA ratio. right = 0.74. left = 0.52   Gary  (TTE)  - Procedure narrative: Transthoracic echocardiography. Image quality was poor. The study was technically difficult, as a result of poor acoustic windows and poor sound wave transmission. - Left ventricle: The cavity size was normal. Wall thickness was normal. Systolic function was normal. The estimated ejection fraction was in the range of 60% to 65%. Regional wall motion abnormalities cannot be excluded. - Mitral valve: Calcified annulus. - Left atrium: The atrium was mildly dilated.  Impressions:  - Technically difficult; LV function appears to be preserved; focal wall motion abnormality cannot be excluded; if clinically indicated, TEE would have better sensitivity for source of embolus.   CBC  Recent Labs Lab 10/04/13 0515 10/05/13 0452 10/06/13 1105 10/07/13 0553 10/08/13 0301  WBC 9.9 11.0* 14.6* 11.6* 13.2*  HGB 14.6 14.2 14.7 14.8 14.5  HCT 43.2 42.2 42.7 43.2 42.4  PLT 187 203 201 187 197  MCV 91.7 91.3 91.4 90.4 90.6  MCH 31.0 30.7 31.5 31.0 31.0  MCHC 33.8 33.6 34.4 34.3 34.2  RDW 13.4 13.3 13.5 13.5 13.8    Chemistries   Recent Labs Lab 10/04/13 0515 10/06/13 0615 10/07/13 0553 10/08/13 0301  NA  143 147 147 149*  K 3.9 3.5* 3.0* 3.0*  CL 105 111 108 110  CO2 19 19 22 22   GLUCOSE 110* 119* 145* 116*  BUN 18 20 18 22   CREATININE 0.71 0.69 0.71 0.78  CALCIUM 9.3 9.2 9.0 9.4  MG 1.9  --  2.1  --    ------------------------------------------------------------------------------------------------------------------ estimated creatinine clearance is 102.5 ml/min (by C-G formula based on Cr of 0.78). ------------------------------------------------------------------------------------------------------------------ No results found for this basename: HGBA1C,  in the last 72 hours ------------------------------------------------------------------------------------------------------------------ No results found for this basename: CHOL, HDL, LDLCALC, TRIG,  CHOLHDL, LDLDIRECT,  in the last 72 hours ------------------------------------------------------------------------------------------------------------------ No results found for this basename: TSH, T4TOTAL, FREET3, T3FREE, THYROIDAB,  in the last 72 hours ------------------------------------------------------------------------------------------------------------------ No results found for this basename: VITAMINB12, FOLATE, FERRITIN, TIBC, IRON, RETICCTPCT,  in the last 72 hours  Coagulation profile  Recent Labs Lab 10/05/13 0452 10/06/13 0615 10/07/13 0553 10/07/13 1726 10/08/13 0301  INR 2.78* 2.52* 2.91* 1.89* 1.49    No results found for this basename: DDIMER,  in the last 72 hours  Cardiac Enzymes  Recent Labs Lab 10/01/13 1332 10/01/13 2052 10/02/13 0148  TROPONINI <0.30 <0.30 <0.30   ------------------------------------------------------------------------------------------------------------------ No components found with this basename: POCBNP,      Time Spent in minutes   35   Thurnell Lose M.D on 10/08/2013 at 11:33 AM  Between 7am to 7pm - Pager - [phone]  After 7pm go to [email or web address] - password [email or web address]  And look for the night coverage person covering for me after hours  [facility] Office  [phone]

## 2013-10-08 NOTE — Progress Notes (Signed)
ANTICOAGULATION CONSULT NOTE - Follow Up Consult  Pharmacy Consult for heparin Indication: atrial fibrillation and stroke  Labs:  Recent Labs  10/06/13 0615 10/06/13 1105 10/07/13 0553 10/07/13 1726 10/08/13 0301  HGB  --  14.7 14.8  --  14.5  HCT  --  42.7 43.2  --  42.4  PLT  --  201 187  --  197  LABPROT 26.3*  --  29.4* 21.1* 17.6*  INR 2.52*  --  2.91* 1.89* 1.49  HEPARINUNFRC  --   --   --   --  0.17*  CREATININE 0.69  --  0.71  --   --     Assessment: 73yo male subtherapeutic on heparin after resumed for bridge for PEG placement.  Goal of Therapy:  Heparin level 0.3-0.5 units/ml   Plan:  Will increase heparin gtt by 2 units/kg/hr to 1400 units/hr and check level in 6hr.  Wynona Neat, PharmD, BCPS  10/08/2013,4:12 AM

## 2013-10-09 ENCOUNTER — Inpatient Hospital Stay (HOSPITAL_COMMUNITY): Payer: Medicare Other

## 2013-10-09 LAB — BASIC METABOLIC PANEL
BUN: 21 mg/dL (ref 6–23)
CO2: 25 mEq/L (ref 19–32)
CREATININE: 0.77 mg/dL (ref 0.50–1.35)
Calcium: 9.2 mg/dL (ref 8.4–10.5)
Chloride: 109 mEq/L (ref 96–112)
GFR calc Af Amer: >90 mL/min (ref >90)
GFR, EST NON AFRICAN AMERICAN: 89 mL/min — AB (ref >90)
GLUCOSE: 98 mg/dL (ref 70–99)
Potassium: 3.1 mEq/L — ABNORMAL LOW (ref 3.7–5.3)
Sodium: 146 mEq/L (ref 137–147)

## 2013-10-09 LAB — CBC
HCT: 39 % (ref 39.0–52.0)
Hemoglobin: 13.2 g/dL (ref 13.0–17.0)
MCH: 31.2 pg (ref 26.0–34.0)
MCHC: 33.8 g/dL (ref 30.0–36.0)
MCV: 92.2 fL (ref 78.0–100.0)
Platelets: 190 10*3/uL (ref 150–400)
RBC: 4.23 MIL/uL (ref 4.22–5.81)
RDW: 14.1 % (ref 11.5–15.5)
WBC: 10.6 10*3/uL — ABNORMAL HIGH (ref 4.0–10.5)

## 2013-10-09 LAB — SURGICAL PCR SCREEN
MRSA, PCR: NEGATIVE
Staphylococcus aureus: POSITIVE — AB

## 2013-10-09 LAB — GLUCOSE, CAPILLARY: GLUCOSE-CAPILLARY: 112 mg/dL — AB (ref 70–99)

## 2013-10-09 LAB — HEPARIN LEVEL (UNFRACTIONATED): Heparin Unfractionated: 0.37 IU/mL (ref 0.30–0.70)

## 2013-10-09 LAB — POTASSIUM: Potassium: 3.8 mEq/L (ref 3.7–5.3)

## 2013-10-09 MED ORDER — POTASSIUM CHLORIDE 10 MEQ/100ML IV SOLN
10.0000 meq | INTRAVENOUS | Status: AC
  Administered 2013-10-09 (×6): 10 meq via INTRAVENOUS
  Filled 2013-10-09 (×6): qty 100

## 2013-10-09 MED ORDER — MAGNESIUM SULFATE IN D5W 10-5 MG/ML-% IV SOLN
1.0000 g | Freq: Once | INTRAVENOUS | Status: AC
  Administered 2013-10-09: 1 g via INTRAVENOUS
  Filled 2013-10-09: qty 100

## 2013-10-09 NOTE — Progress Notes (Signed)
Heparin drip stopped at 0721 per verbal order from IR. Dr. Barbie Banner aware of drip being stopped. Elspeth Cho, RN 10/09/13 [phone]

## 2013-10-09 NOTE — Progress Notes (Signed)
ANTICOAGULATION & ANTICOAGULATION CONSULT NOTE - Follow Up Consult  Pharmacy Consult for Vancomycin + Zosyn & Heparin Indication: r/o HCAP/asp PNA & hx Afib  Allergies  Allergen Reactions  . Codeine Other (See Comments)    Unknown reaction (per MAR)   . Food Other (See Comments)    Seafood - unknown reaction  (per MAR)  . Shellfish Allergy Other (See Comments)    Unknown reaction (per MAR)  . Sulfa Antibiotics Other (See Comments)    Unknown reaction (per MAR)  . Wellbutrin [Bupropion] Other (See Comments)    Unknown reaction (per [facility])    Patient Measurements: Height: 6\' 4"  (193 cm) Weight: 216 lb 14.9 oz (98.4 kg) IBW/kg (Calculated) : 86.8  Vital Signs: Temp: 98 F (36.7 C) (05/15 1040) Temp src: Oral (05/15 1040) BP: 134/91 mmHg (05/15 1040) Pulse Rate: 91 (05/15 1040)  Labs:  Recent Labs  10/07/13 0553 10/07/13 1726 10/08/13 0301 10/08/13 1115 10/09/13 0500 10/09/13 0545  HGB 14.8  --  14.5  --  13.2  --   HCT 43.2  --  42.4  --  39.0  --   PLT 187  --  197  --  190  --   LABPROT 29.4* 21.1* 17.6*  --   --   --   INR 2.91* 1.89* 1.49  --   --   --   HEPARINUNFRC  --   --  0.17* 0.37  --  0.37  CREATININE 0.71  --  0.78  --  0.77  --     Estimated Creatinine Clearance: 102.5 ml/min (by C-G formula based on Cr of 0.77).   Medications:  Heparin @ 1400 units/hr  Assessment: 18 YOM who continues on heparin for hx Afib and new CVA while unable to take oral anticoagulation. Heparin level this morning was therapeutic however drip has now been turned off for plans for PEG tube placement by IR today. Per discussion with the RN - she is not sure when this will take place but received orders to turn the drip off this morning. Will f/u plans to resume anticoagulation post-procedure.  The patient also continues on Vancomycin + Zosyn for r/o HCAP/asp PNA. Renal function is stable and doses remain appropriate.   Goal of Therapy:  Heparin level 0.3-0.7  units/ml Monitor platelets by anticoagulation protocol: Yes   Plan:  1. Continue Vancomycin 1g IV every 12 hours 2. Continue Zosyn 3.375g IV every 8 hours 3. Will f/u plans to resume IV heparin or switch to an oral anticoagulant post-PEG placement today.  Alycia Rossetti, PharmD, BCPS Clinical Pharmacist Pager: [phone] 10/10/2013 9:20 AM

## 2013-10-09 NOTE — Progress Notes (Signed)
Speech Language Pathology Treatment: Dysphagia  Patient Details Name: Gary Reynolds MRN: [id number] DOB: [birth year] Today's Date: 10/09/2013 Time: 2751-7001 SLP Time Calculation (min): 22 min  Assessment / Plan / Recommendation Clinical Impression  Pt. seen for dysphagia treatment with increased (mildly) endurance and ability to participate than prior session.  Max verbal and visual cues during to facilitate respiratory strength and vocal cord adduction (throat clear).  Unable to clear audible upper airway secretions volitionally or reflexively (SLP suctioning posterior oral cavity/tongue base).  Sustained phonation for approximately 2 seconds with numerous verbal cues to adduct cords versus whisper.  Hopeful for nutrition soon as 9 days since last food/liquid.  ST to continue rehabilitation.    HPI HPI: 73 y.o. 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 Chest x-ray, UA, head CT, EKG were unremarkable. MRI acute nonhemorrhagic infarct involving the left coronal radiata, remote hemorrhagic infarcts of the right basal ganglia and frontal operculum.  Repeat MBS 5/12 recommended continue NPO.  Plan is for PEG.    Pertinent Vitals WDL  SLP Plan  Continue with current plan of care    Recommendations Diet recommendations: NPO Medication Administration: Via alternative means              Oral Care Recommendations: Oral care BID Follow up Recommendations: Skilled Nursing facility Plan: Continue with current plan of care    GO     Houston Siren M.Ed [employer] [phone]  10/09/2013

## 2013-10-09 NOTE — Progress Notes (Signed)
Barium Sulfate given to patient via NG tube as ordered. Tomma Rakers, RN

## 2013-10-09 NOTE — Progress Notes (Signed)
Physical Therapy Treatment Patient Details Name: Gary Reynolds MRN: [id number] DOB: Aug 05, 1940 Today's Date: 10/09/2013    History of Present Illness 73 y.o. 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 MRI revealed acute ischemic left cornea radiata CVA.    PT Comments    Pt continues to be total (A) for mobility. Pt tolerated sitting EOB ~8 min with total (A) due to pushing posteriorly. Performed PROM on bil LEs to prevent contractures; increased tone in Lt LE noted.  Pt with less verbalizations during session today. Cont to recommend SNF for post acute rehab.   Follow Up Recommendations  SNF     Equipment Recommendations  None recommended by PT    Recommendations for Other Services       Precautions / Restrictions Precautions Precautions: Fall Precaution Comments: dense Lt side hemiplegia  Restrictions Weight Bearing Restrictions: No    Mobility  Bed Mobility Overal bed mobility: +2 for physical assistance;Needs Assistance Bed Mobility: Supine to Sit;Sit to Supine;Rolling Rolling: Total assist   Supine to sit: +2 for physical assistance;Total assist;HOB elevated Sit to supine: Total assist;+2 for physical assistance   General bed mobility comments: pt not following commands to (A) with mobility today; requires total (A) for 2 for bed mobility; pt pushing posteriorly thoughout sitting EOB; unsafe for transfers at this time; will require a lift; returned to supine position after sitting EOB with total (A) x 8 min; pt rolled and repositioned on Rt side to prevent pressure sores    Transfers                    Ambulation/Gait                 Stairs            Wheelchair Mobility    Modified Rankin (Stroke Patients Only) Modified Rankin (Stroke Patients  Only) Pre-Morbid Rankin Score: Severe disability Modified Rankin: Severe disability     Balance Overall balance assessment: Needs assistance Sitting-balance support: Feet supported;Single extremity supported Sitting balance-Leahy Scale: Zero Sitting balance - Comments: pt unable to follow multimodal commands and utilize Rt UE to (A) with bracing for sitting balance EOB; pt with constant posterior lean  Postural control: Posterior lean                          Cognition Arousal/Alertness: Awake/alert Behavior During Therapy: Flat affect Overall Cognitive Status: Difficult to assess Area of Impairment: Problem solving;Following commands;Attention   Current Attention Level: Focused   Following Commands: Follows one step commands inconsistently   Awareness: Intellectual Problem Solving: Slow processing;Decreased initiation;Difficulty sequencing;Requires verbal cues;Requires tactile cues General Comments: no family present; pt with less verbalizations today; difficult to determine cognitive status at this time; inconsistent with follow ing commands    Exercises General Exercises - Lower Extremity Ankle Circles/Pumps: PROM;Both;10 reps;Supine;Other (comment) (tone in Lt heel cord noted) Long Arc Quad: PROM;Both;5 reps;Seated Heel Slides: PROM;Both;10 reps;Supine;Other (comment) (tone noted in Lt LE; pt resistive in pain at times ) Hip ABduction/ADduction: PROM;Both;10 reps;Supine    General Comments        Pertinent Vitals/Pain Inconsistent c/o pain throughout session with PROM; pt repositioned on Rt side with pillows; UEs elevated    Home Living  Prior Function            PT Goals (current goals can now be found in the care plan section) Acute Rehab PT Goals Patient Stated Goal: no goal stated; no family present  PT Goal Formulation: With patient Time For Goal Achievement: 10/09/13 Potential to Achieve Goals: Fair Progress towards  PT goals: Progressing toward goals    Frequency  Min 2X/week    PT Plan Current plan remains appropriate    Co-evaluation             End of Session   Activity Tolerance: Patient tolerated treatment well Patient left: in bed;with call bell/phone within reach;with bed alarm set     Time: 1336-1401 PT Time Calculation (min): 25 min  Charges:  $Therapeutic Exercise: 8-22 mins $Therapeutic Activity: 8-22 mins                    G CodesKennis Carina [place], [place]  [phone] 10/09/2013, 3:53 PM

## 2013-10-09 NOTE — Progress Notes (Signed)
Patient Demographics  Gary Reynolds, is a 73 y.o. male, DOB - Apr 11, 1941, UJW:[id number]  Admit date - 10/01/2013   Admitting Physician Thurnell Lose, MD  Outpatient Primary MD for the patient is No primary provider on file.  LOS - 8    Chief Complaint  Patient presents with  . 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    Workup here suggestive of left sided CVA ischemic, with some right-sided weakness and dysphagia, developed aspiration pneumonia, he will require a PEG tube placement which is due to be placed 10/09/2013, he was on Coumadin and post PEG tube will be transitioned to Eliquis. In the interim heparin drip. Has been seen by neuro who have now signed off.     Assessment & Plan    1. Decreased mental status. Due to acute ischemic left cornea radiata CVA. Likely embolic from his heart, he has underlying A. fib and his INR was subtherapeutic with a CHADVAS 2 score of at least 4 - continue to monitor on telemetry bed, reviewed MRI MRA of the brain, head CT unremarkable, INR is below 2 will be bridged by heparin without bolus and then okay to switch him to Eliquis also discussed with her neurologist Dr. Leonie Man and patient's son Buddy on  10/07/2013 who agrees with it . EEG negative on 10/01/2013. Noted echo gram-carotid duplex. Clinically showing gradual but definite improvement on a daily basis. Have requested speech again to see him in continued followup.    2. Atrial fibrillation. Currently in rate control, as needed IV Lopressor, INR below 2, heparin bridge without bolus till PEG tube placed and then will be switched to Eliquis per neuro.   Son understands the risks and benefits of vitamin K with possibility of subtherapeutic INR and stroke, he wants to get INR down as soon as possible for a PEG tube to give his status chance of getting adequate nutrition and fighting of microaspiration and his sickness.    3. History of CAD. Stable EKG and 3 sets of troponin, as needed beta blocker IV as he is n.p.o., sublingual nitroglycerin when necessary.     4. Left arm pain. Improved with stable x-ray of left humerus.      5. Hypertension. No acute issues, unable to oral medications we'll place on hydralazine as needed.     6. GERD. Continue PPI.      7. Dyslipidemia. Resume home dose statin once taking oral diet.       8. Redness in both eyes. Per son chronic redness in left eye. We'll place on Cipro eye drops and monitor.     9. Continued dysphagia remains at risk for continued  mite to aspiration. Clinically now appears to be developing HCAP, continue head of the bed elevation, oral suctioning and NT suctioning, continues to be n.p.o., explained to son Vonna Drafts again in detail on 10/07/2013 that microaspiration cannot be completely prevented.   He should has failed swallow test, somebody and patient now okay for PEG tube placement, INR is rising despite him not being on Coumadin for the last 3 days as he is n.p.o., likely old Coumadin still working with no vitamin K in the system due to n.p.o. status. Son eager to get a PEG tube placed early, 2 mg of IV white woman K. will be given on 10/07/2013, we'll be checking  INR every 12 hours x2, once INR below 2 Will bridge with heparin without bolus. IR has been consulted for PEG tube placement.     10.HCAP - due to #9, aspiration precautions as above, oral and NT suctioning, empiric IV antibiotics. Monitor. Have added chest PT through chest vest, sounds appear much clearer on 10/09/2013 after chest PT.     11. Hypokalemia. Replaced IV, recheck again in the morning.     12. ? Lung Nodule - repeat CXR in 4 weeks.      Code Status: Full    Family Communication: detailed with Luvenia Redden on 10/01/2013 bedside, with his son Vonna Drafts over the phone on 10/02/2013 8:30 AM, left messages for Buddy again on 10/02/2013 at 7 PM, 10/03/2048 at 9 AM on phone and then 10 AM in person, left message for Buddy Iwasaki again 10/04/2013 at 10:15 AM, 10/05/13 @ 9.30 am, 10/06/2013 @ 9:45 AM.   Vonna Drafts on phone 10/07/2013 at 8:45 AM.   Buddy on form will be updated again on 10/08/2013 at 11:40 AM, 10/09/2048 9:45 AM     Disposition Plan:  Rehab/CIR    Procedures MRI-MRA Brain, TTE, Carotid, EEG, PICC line 10/06/2013, modified barium 10/06/2013. PEG tube possibly on 10/09/2013, NG on 10/09/2013   Consults  Neuro   Medications  Scheduled Meds: .  ceFAZolin (ANCEF) IV  2 g Intravenous On Call  . ciprofloxacin  2 drop Both Eyes Q4H while awake  . magnesium sulfate 1 - 4 g bolus IVPB  1 g Intravenous Once  . piperacillin-tazobactam (ZOSYN)  IV  3.375 g Intravenous 3 times per day  . potassium chloride  10 mEq Intravenous Q1 Hr x 6  . vancomycin  1,000 mg Intravenous Q12H   Continuous Infusions: . heparin Stopped (10/09/13 0739)   PRN Meds:.acetaminophen, fentaNYL, hydrALAZINE, metoprolol, morphine injection, nitroGLYCERIN, senna-docusate, sodium chloride   DVT Prophylaxis  Coumadin, once INR below 2 switch to Eliquis   Lab Results  Component Value Date   PLT 190 10/09/2013   Lab Results  Component Value Date   INR 1.49 10/08/2013   INR 1.89*  10/07/2013   INR 2.91* 10/07/2013     Antibiotics     Anti-infectives   Start     Dose/Rate Route Frequency Ordered Stop   10/09/13 0700  ceFAZolin (ANCEF) IVPB 2 g/50 mL premix    Comments:  Hang on call to IR procedure   2 g 100 mL/hr over 30 Minutes Intravenous On call 10/08/13 1022 10/10/13 0700   10/07/13 0500  vancomycin (VANCOCIN) IVPB 1000 mg/200 mL premix     1,000 mg 200 mL/hr over 60 Minutes Intravenous Every 12 hours 10/06/13 1708     10/06/13 2330  clindamycin (CLEOCIN) IVPB 600 mg  Status:  Discontinued     600 mg 100 mL/hr over  30 Minutes Intravenous 3 times per day 10/06/13 2324 10/07/13 0616   10/06/13 1715  piperacillin-tazobactam (ZOSYN) IVPB 3.375 g     3.375 g 12.5 mL/hr over 240 Minutes Intravenous 3 times per day 10/06/13 1708     10/06/13 1715  vancomycin (VANCOCIN) 1,500 mg in sodium chloride 0.9 % 500 mL IVPB     1,500 mg 250 mL/hr over 120 Minutes Intravenous  Once 10/06/13 1708 10/06/13 2108          Subjective:   [facility] today has, No headache, No chest pain, No abdominal pain - No Nausea, No new weakness tingling or numbness, No Cough - SOB. Less lethargic today, speech slightly improved.   Objective:   Filed Vitals:   10/08/13 1745 10/08/13 2137 10/09/13 0142 10/09/13 0536  BP: 131/83 122/78 148/97 144/99  Pulse: 93 91 80 82  Temp: 97.4 F (36.3 C) 98 F (36.7 C) 97.9 F (36.6 C) 97.5 F (36.4 C)  TempSrc: Oral Oral Oral Oral  Resp: 20 18 18 16   Height:      Weight:      SpO2: 99% 100% 98% 97%    Wt Readings from Last 3 Encounters:  10/01/13 98.4 kg (216 lb 14.9 oz)     Intake/Output Summary (Last 24 hours) at 10/09/13 0947 Last data filed at 10/09/13 0255  Gross per 24 hour  Intake      0 ml  Output    975 ml  Net   -975 ml     Physical Exam  Awake -still slightly weak but less lethargic, Oriented X 2, No new F.N deficits, chronic left-sided dense hemiparesis with left arm is 0/5, left foot 1/5, Normal affect  ,weak cough reflex but has shown some improvement [place].AT,PERRAL, L eye slightly red (some chronic redness) improving. Supple Neck,No JVD, No cervical lymphadenopathy appriciated.  Symmetrical Chest wall movement, Good air movement bilaterally, clear breath sounds RRR,No Gallops,Rubs or new Murmurs, No Parasternal Heave +ve B.Sounds, Abd Soft, Non tender, No organomegaly appriciated, No rebound - guarding or rigidity. No Cyanosis, Clubbing or edema, No new Rash or bruise       Data Review   Micro Results Recent Results (from the past 240 hour(s))  CULTURE, BLOOD (ROUTINE X 2)     Status: None   Collection Time    10/06/13  9:05 PM      Result Value Ref Range Status   Specimen Description BLOOD LEFT HAND   Final   Special Requests BOTTLES DRAWN AEROBIC ONLY 3CC   Final   Culture  Setup Time     Final   Value: 10/07/2013 00:48     Performed at [facility]   Culture     Final   Value:        BLOOD CULTURE RECEIVED NO GROWTH TO DATE CULTURE WILL BE HELD FOR 5 DAYS BEFORE ISSUING A FINAL NEGATIVE REPORT     Performed at [facility]   Report Status PENDING   Incomplete  CULTURE, BLOOD (ROUTINE X 2)     Status: None   Collection Time    10/06/13  9:10 PM      Result Value Ref Range Status   Specimen Description BLOOD LEFT HAND   Final   Special Requests BOTTLES DRAWN AEROBIC ONLY 3CC   Final   Culture  Setup Time     Final   Value: 10/07/2013 00:48     Performed at [facility]  Final   Value:        BLOOD CULTURE RECEIVED NO GROWTH TO DATE CULTURE WILL BE HELD FOR 5 DAYS BEFORE ISSUING A FINAL NEGATIVE REPORT     Performed at [facility]   Report Status PENDING   Incomplete  SURGICAL PCR SCREEN     Status: Abnormal   Collection Time    10/09/13  5:14 AM      Result Value Ref Range Status   MRSA, PCR NEGATIVE  NEGATIVE Final   Staphylococcus aureus POSITIVE (*) NEGATIVE Final   Comment:            The Xpert SA Assay (FDA      approved for NASAL specimens     in patients over 80 years of age),     is one component of     a comprehensive surveillance     program.  Test performance has     been validated by [employer] for patients greater     than or equal to 108 year old.     It is not intended     to diagnose infection nor to     guide or monitor treatment.    Radiology Reports Dg Chest 2 View  10/01/2013   CLINICAL DATA:  Weakness.  EXAM: CHEST  2 VIEW  COMPARISON:  PA and lateral chest 02/20/2007.  FINDINGS: The lungs are clear. Heart size is normal. There is no pneumothorax or pleural effusion. No focal bony abnormality.  IMPRESSION: No acute disease.   Electronically Signed   By: Inge Rise M.D.   On: 10/01/2013 10:28   Ct Head Wo Contrast  10/01/2013   CLINICAL DATA:  Weakness.  Lethargy.  EXAM: CT HEAD WITHOUT CONTRAST  TECHNIQUE: Contiguous axial images were obtained from the base of the skull through the vertex without intravenous contrast.  COMPARISON:  04/04/2007  FINDINGS: There is no acute intracranial hemorrhage, infarction, or mass lesion. There is an old partially calcified stroke in the right basal ganglia. There is extensive periventricular white matter lucency, worse on the right than the left, consistent with chronic ischemic changes. There is a small old right cerebellar infarct. There is diffuse atrophy with secondary slight ventricular dilatation.  No acute osseous abnormalities. Dense calcification in the distal vertebral arteries.  IMPRESSION: No acute intracranial abnormality. Atrophy with old strokes and chronic small vessel ischemic changes. Atrophy has slightly progressed since the prior study.   Electronically Signed   By: Rozetta Nunnery M.D.   On: 10/01/2013 11:07   Mr Brain Wo Contrast  10/01/2013   CLINICAL DATA:  Increased weakness and lethargy a. question stroke.  EXAM: MRI HEAD WITHOUT CONTRAST  TECHNIQUE: Multiplanar, multiecho pulse sequences of the brain and surrounding  structures were obtained without intravenous contrast.  COMPARISON:  CT head from the same day.  FINDINGS: The diffusion-weighted images demonstrate an acute non hemorrhagic infarct involving the left coronal radiata measuring 1.5 cm. No other acute ischemia is evident.  Remote infarcts of the right basal ganglia and frontal operculum are noted. Atrophy and extensive white matter disease is present bilaterally. T2 changes are associated with the a acute infarct. Remote hemorrhages noted within the right basal ganglia and operculum air infarcts. There is associated ex vacuo dilation of the right lateral ventricle.  Flow is present in the major intracranial arteries. The globes and orbits are intact. The paranasal sinuses and mastoid air cells are clear.  IMPRESSION: 1. Acute  nonhemorrhagic infarct involving the left coronal radiata. 2. Remote hemorrhagic infarcts of the right basal ganglia and frontal operculum. 3. Atrophy and diffuse white matter disease likely reflects the sequelae of chronic microvascular ischemia.   Electronically Signed   By: Lawrence Santiago M.D.   On: 10/01/2013 17:01   Dg Humerus Left  10/01/2013   CLINICAL DATA:  Left arm pain.  EXAM: LEFT HUMERUS - 2+ VIEW  COMPARISON:  None.  FINDINGS: Bones are osteopenic. No fracture or focal suspicious bony abnormality is identified. No discrete soft tissue swelling. There are degenerative changes of the acromioclavicular joint.  IMPRESSION: Decreased bony mineralization.  No acute bony abnormality.   Electronically Signed   By: Curlene Dolphin M.D.   On: 10/01/2013 14:41    Carotids  - The vertebral arteries appear patent with antegrade flow. - Findings consistent with1- 39 percent stenosis involving the right internal carotid artery and the left internal carotid artery. - ICA/CCA ratio. right = 0.74. left = 0.52   Echo (TTE)  - Procedure narrative: Transthoracic echocardiography. Image quality was poor. The study was technically difficult, as  a result of poor acoustic windows and poor sound wave transmission. - Left ventricle: The cavity size was normal. Wall thickness was normal. Systolic function was normal. The estimated ejection fraction was in the range of 60% to 65%. Regional wall motion abnormalities cannot be excluded. - Mitral valve: Calcified annulus. - Left atrium: The atrium was mildly dilated.  Impressions:  - Technically difficult; LV function appears to be preserved; focal wall motion abnormality cannot be excluded; if clinically indicated, TEE would have better sensitivity for source of embolus.   CBC  Recent Labs Lab 10/05/13 0452 10/06/13 1105 10/07/13 0553 10/08/13 0301 10/09/13 0500  WBC 11.0* 14.6* 11.6* 13.2* 10.6*  HGB 14.2 14.7 14.8 14.5 13.2  HCT 42.2 42.7 43.2 42.4 39.0  PLT 203 201 187 197 190  MCV 91.3 91.4 90.4 90.6 92.2  MCH 30.7 31.5 31.0 31.0 31.2  MCHC 33.6 34.4 34.3 34.2 33.8  RDW 13.3 13.5 13.5 13.8 14.1    Chemistries   Recent Labs Lab 10/04/13 0515 10/06/13 0615 10/07/13 0553 10/08/13 0301 10/09/13 0500  NA 143 147 147 149* 146  K 3.9 3.5* 3.0* 3.0* 3.1*  CL 105 111 108 110 109  CO2 19 19 22 22 25   GLUCOSE 110* 119* 145* 116* 98  BUN 18 20 18 22 21   CREATININE 0.71 0.69 0.71 0.78 0.77  CALCIUM 9.3 9.2 9.0 9.4 9.2  MG 1.9  --  2.1  --   --    ------------------------------------------------------------------------------------------------------------------ estimated creatinine clearance is 102.5 ml/min (by C-G formula based on Cr of 0.77). ------------------------------------------------------------------------------------------------------------------ No results found for this basename: HGBA1C,  in the last 72 hours ------------------------------------------------------------------------------------------------------------------ No results found for this basename: CHOL, HDL, LDLCALC, TRIG, CHOLHDL, LDLDIRECT,  in the last 72  hours ------------------------------------------------------------------------------------------------------------------ No results found for this basename: TSH, T4TOTAL, FREET3, T3FREE, THYROIDAB,  in the last 72 hours ------------------------------------------------------------------------------------------------------------------ No results found for this basename: VITAMINB12, FOLATE, FERRITIN, TIBC, IRON, RETICCTPCT,  in the last 72 hours  Coagulation profile  Recent Labs Lab 10/05/13 0452 10/06/13 0615 10/07/13 0553 10/07/13 1726 10/08/13 0301  INR 2.78* 2.52* 2.91* 1.89* 1.49    No results found for this basename: DDIMER,  in the last 72 hours  Cardiac Enzymes No results found for this basename: CK, CKMB, TROPONINI, MYOGLOBIN,  in the last 168 hours ------------------------------------------------------------------------------------------------------------------ No components found with this basename: POCBNP,  Time Spent in minutes   35   Thurnell Lose M.D on 10/09/2013 at 9:47 AM  Between 7am to 7pm - Pager - [phone]  After 7pm go to [email or web address] - password [email or web address]  And look for the night coverage person covering for me after hours  [facility] Office  [phone]

## 2013-10-09 NOTE — Progress Notes (Signed)
Patient ID: Gary Reynolds, male   DOB: 08-12-1940, 73 y.o.   MRN: [id number]                                                                                                                                                                         Pt had NG tube placed today. Barium will be administered tonight via NG and KUB checked in am. If barium in colon / not in stomach and pt stable, will proceed with gastrostomy tube placement in am. Details/risks of above d/w pt's son, Buddy, with his understanding and consent. IV heparin stopped earlier this am per nurse.

## 2013-10-09 NOTE — Care Management Note (Signed)
    Page 1 of 1   10/09/2013     11:12:43 AM CARE MANAGEMENT NOTE 10/09/2013  Patient:  Gary Reynolds,Gary Reynolds   Account Number:  [id number]  Date Initiated:  10/06/2013  Documentation initiated by:  [facility]  Subjective/Objective Assessment:   Decreased mental status. D/t acute ischemic left cornea radiata CVA     Action/Plan:   SNF   Anticipated DC Date:  10/09/2013   Anticipated DC Plan:  SKILLED NURSING FACILITY  In-house referral  Clinical Social Worker      DC Planning Services  CM consult      Choice offered to / List presented to:             Status of service:  Completed, signed off Medicare Important Message given?  YES (If response is "NO", the following Medicare IM given date fields will be blank) Date Medicare IM given:  10/06/2013 Date Additional Medicare IM given:  10/09/2013  Discharge Disposition:    Per UR Regulation:  Reviewed for med. necessity/level of care/duration of stay  If discussed at [place] of Stay Meetings, dates discussed:    Comments:  10/09/13 [place], MSN, CM- Medicare IM left at bedside.  Patient unable to sign, no family available at bedside.   10/06/2013 1320 Medicare IM given, placed on chart. Son completed Medicare IM. Jonnie Finner RN CCM Case Mgmt phone [phone]

## 2013-10-09 NOTE — Consult Note (Signed)
Reason for Consult: Back pain, x-ray shows right hip subcapital fracture old versus new Referring Physician: Dr. Lala Lund  Gary Reynolds is an 73 y.o. male.  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 Admitted on 10/01/2013 for what turned out to be an extension of his stroke, with lethargy and diminished mental status. Patient has great difficulty communicating at this time. Patient had left arm pain on admission, but x-ray showed no fracture of the humerus. He is also complaining of tailbone pain and today had AP pelvis and hip films accomplished that showed a minimally displaced subcapital fracture of the femoral head on the right of indeterminate age. I attempted asked patient about any falls at the nursing home but he was unable to answer my questions. He also reported significant pain in his hips knees ankles shoulders elbows and wrists. When I asked him if his right hip her worse than his left hip he was unable to answer the question. I did contact his wife on the phone who could not recall any recent falls or complaints of right hip pain and said that the last time she is aware of a fall was about 4 years ago.     Past Medical History  Diagnosis Date  . Stroke   . Hypertension   . Coronary artery disease   . Atrial fibrillation   . GERD 11/25/2006    Qualifier: Diagnosis of  By: Leanne Chang MD, Bruce    . HYPERLIPIDEMIA 11/25/2006    Qualifier: Diagnosis of  By: Leanne Chang MD, Bruce      History reviewed. No pertinent past surgical history.  Family History  Problem Relation Age of Onset  . Hyperlipidemia Mother   . Hypertension Mother   . Hypertension Father     Social History:  reports that he has quit smoking. He does not have any smokeless tobacco history on file. He reports that he does not drink alcohol. His drug history is not on file.  Allergies:  Allergies   Allergen Reactions  . Codeine Other (See Comments)    Unknown reaction (per MAR)   . Food Other (See Comments)    Seafood - unknown reaction  (per MAR)  . Shellfish Allergy Other (See Comments)    Unknown reaction (per MAR)  . Sulfa Antibiotics Other (See Comments)    Unknown reaction (per MAR)  . Wellbutrin [Bupropion] Other (See Comments)    Unknown reaction (per [facility])    Medications: I have reviewed the patient's current medications.  Results for orders placed during the hospital encounter of 10/01/13 (from the past 48 hour(s))  PROTIME-INR     Status: Abnormal   Collection Time    10/08/13  3:01 AM      Result Value Ref Range   Prothrombin Time 17.6 (*) 11.6 - 15.2 seconds   INR 1.49  0.00 - 1.49  CBC     Status: Abnormal   Collection Time    10/08/13  3:01 AM      Result Value Ref Range   WBC 13.2 (*) 4.0 - 10.5 K/uL   RBC 4.68  4.22 - 5.81 MIL/uL   Hemoglobin 14.5  13.0 - 17.0 g/dL   HCT 42.4  39.0 - 52.0 %   MCV 90.6  78.0 - 100.0 fL   MCH 31.0  26.0 - 34.0 pg   MCHC 34.2  30.0 - 36.0 g/dL   RDW 13.8  11.5 - 15.5 %   Platelets 197  150 - 400 K/uL  BASIC METABOLIC PANEL     Status: Abnormal   Collection Time    10/08/13  3:01 AM      Result Value Ref Range   Sodium 149 (*) 137 - 147 mEq/L   Potassium 3.0 (*) 3.7 - 5.3 mEq/L   Chloride 110  96 - 112 mEq/L   CO2 22  19 - 32 mEq/L   Glucose, Bld 116 (*) 70 - 99 mg/dL   BUN 22  6 - 23 mg/dL   Creatinine, Ser 0.78  0.50 - 1.35 mg/dL   Calcium 9.4  8.4 - 10.5 mg/dL   GFR calc non Af Amer 88 (*) >90 mL/min   GFR calc Af Amer >90  >90 mL/min   Comment: (NOTE)     The eGFR has been calculated using the CKD EPI equation.     This calculation has not been validated in all clinical situations.     eGFR's persistently <90 mL/min signify possible Chronic Kidney     Disease.  HEPARIN LEVEL (UNFRACTIONATED)     Status: Abnormal   Collection Time    10/08/13  3:01 AM      Result Value Ref Range   Heparin  Unfractionated 0.17 (*) 0.30 - 0.70 IU/mL   Comment:            IF HEPARIN RESULTS ARE BELOW     EXPECTED VALUES, AND PATIENT     DOSAGE HAS BEEN CONFIRMED,     SUGGEST FOLLOW UP TESTING     OF ANTITHROMBIN III LEVELS.  HEPARIN LEVEL (UNFRACTIONATED)     Status: None   Collection Time    10/08/13 11:15 AM      Result Value Ref Range   Heparin Unfractionated 0.37  0.30 - 0.70 IU/mL   Comment:            IF HEPARIN RESULTS ARE BELOW     EXPECTED VALUES, AND PATIENT     DOSAGE HAS BEEN CONFIRMED,     SUGGEST FOLLOW UP TESTING     OF ANTITHROMBIN III LEVELS.  CBC     Status: Abnormal   Collection Time    10/09/13  5:00 AM      Result Value Ref Range   WBC 10.6 (*) 4.0 - 10.5 K/uL   RBC 4.23  4.22 - 5.81 MIL/uL   Hemoglobin 13.2  13.0 - 17.0 g/dL   HCT 39.0  39.0 - 52.0 %   MCV 92.2  78.0 - 100.0 fL   MCH 31.2  26.0 - 34.0 pg   MCHC 33.8  30.0 - 36.0 g/dL   RDW 14.1  11.5 - 15.5 %   Platelets 190  150 - 400 K/uL  BASIC METABOLIC PANEL     Status: Abnormal   Collection Time    10/09/13  5:00 AM      Result Value Ref Range   Sodium 146  137 - 147 mEq/L   Potassium 3.1 (*) 3.7 - 5.3 mEq/L   Chloride 109  96 - 112 mEq/L   CO2 25  19 - 32 mEq/L   Glucose, Bld 98  70 - 99 mg/dL   BUN 21  6 - 23 mg/dL   Creatinine, Ser 0.77  0.50 - 1.35 mg/dL   Calcium 9.2  8.4 - 10.5 mg/dL   GFR calc non Af Amer 89 (*) >  90 mL/min   GFR calc Af Amer >90  >90 mL/min   Comment: (NOTE)     The eGFR has been calculated using the CKD EPI equation.     This calculation has not been validated in all clinical situations.     eGFR's persistently <90 mL/min signify possible Chronic Kidney     Disease.  SURGICAL PCR SCREEN     Status: Abnormal   Collection Time    10/09/13  5:14 AM      Result Value Ref Range   MRSA, PCR NEGATIVE  NEGATIVE   Staphylococcus aureus POSITIVE (*) NEGATIVE   Comment:            The Xpert SA Assay (FDA     approved for NASAL specimens     in patients over 21 years of  age),     is one component of     a comprehensive surveillance     program.  Test performance has     been validated by [employer] for patients greater     than or equal to 20 year old.     It is not intended     to diagnose infection nor to     guide or monitor treatment.  HEPARIN LEVEL (UNFRACTIONATED)     Status: None   Collection Time    10/09/13  5:45 AM      Result Value Ref Range   Heparin Unfractionated 0.37  0.30 - 0.70 IU/mL   Comment:            IF HEPARIN RESULTS ARE BELOW     EXPECTED VALUES, AND PATIENT     DOSAGE HAS BEEN CONFIRMED,     SUGGEST FOLLOW UP TESTING     OF ANTITHROMBIN III LEVELS.  GLUCOSE, CAPILLARY     Status: Abnormal   Collection Time    10/09/13  1:11 PM      Result Value Ref Range   Glucose-Capillary 112 (*) 70 - 99 mg/dL  POTASSIUM     Status: None   Collection Time    10/09/13  7:31 PM      Result Value Ref Range   Potassium 3.8  3.7 - 5.3 mEq/L   Comment: NO VISIBLE HEMOLYSIS    Dg Chest 2 View  10/08/2013   CLINICAL DATA:  Hypertension, followup infiltrates  EXAM: CHEST  2 VIEW  COMPARISON:  10/06/2013  FINDINGS: RIGHT arm PICC line stable with tip projecting over SVC.  Normal heart size, mediastinal contours and pulmonary vascularity.  Persistent mild RIGHT basilar infiltrate, perhaps slightly improved though an area of somewhat more focal density remains laterally.  Minimal LEFT base atelectasis.  Upper lungs clear.  No pleural effusion or pneumothorax.  Bones demineralized.  IMPRESSION: Slightly improved RIGHT basilar infiltrate though an area of somewhat more focal residual opacity remains, likely infiltrate but nodule is not excluded; followup radiographs until resolution recommended to exclude pulmonary nodule.   Electronically Signed   By: Lavonia Dana M.D.   On: 10/08/2013 15:32   Dg Hip Bilateral W/pelvis  10/09/2013   CLINICAL DATA:  Bilateral hip pain.  EXAM: BILATERAL HIP WITH PELVIS - 4+ VIEW  COMPARISON:  CT abdomen and  pelvis 12/19/2004  FINDINGS: The bones are diffusely osteopenic. There is a minimally displaced subcapital femoral neck fracture on the right. There is no dislocation. No left femur fracture is identified. Mild hip osteoarthrosis is present bilaterally. Degenerative changes are present in  the lower lumbar spine.  IMPRESSION: Minimally displaced right subcapital femur fracture.   Electronically Signed   By: Logan Bores   On: 10/09/2013 17:06   Dg Abd 1 View  10/09/2013   CLINICAL DATA:  Nasogastric tube placement  EXAM: ABDOMEN - 1 VIEW  COMPARISON:  02/14/2007  FINDINGS: Tip of nasogastric tube projects over gastric antrum.  Moderate gas within stomach.  Bones appear demineralized.  IMPRESSION: Tip of nasogastric tube projects over gastric antrum.   Electronically Signed   By: Lavonia Dana M.D.   On: 10/09/2013 09:47   Dg Addison Bailey G Tube Plc W/fl-no Rad  10/09/2013   CLINICAL DATA: unable to place on floor   NASO G TUBE PLACEMENT WITH FLUORO  Fluoroscopy was utilized by the requesting physician.  No radiographic  interpretation.     ROS the patient is unable to give a coherent review of systems at this time  Blood pressure 145/86, pulse 61, temperature 98.8 F (37.1 C), temperature source Oral, resp. rate 18, height $RemoveBe'6\' 4"'qhGmhTZWb$  (1.93 m), weight 98.4 kg (216 lb 14.9 oz), SpO2 97.00%. Physical Exam: Internal and external rotation of both hips does not cause any facial grimace or obvious expression of pain, foot tap sign negative. Skin over his hips is intact. He can move his ankles and feet without too much difficulty there are no effusions to his knees or ankles. I personally reviewed the x-rays on the [employer] system specifically the right hip which may have a minimally impacted right subcapital femoral head fracture.I am unable to discern if the fracture is old or new.   Assessment/Plan:  Assessment: Probable right femoral head sub capital fracture, mildly valgus impacted of indeterminate age. By history  and examination I am unable to determine if there is a fracture and if it is old or new peer.  Plan: Ideally an MRI scan would be of value to determine if the fracture is present and old or new. I do not think he be able to hold still for 45 minutes. Therefore I've ordered a CT scan of the right hip, as there is a good chance that we'll be able to tell if there is a fracture, if it is healed, and will give better information on a displacement. Even if the fracture is acute or subacute, because he is bed bound and a poor surgical risk treated with bed rest may be reasonable with a good result probable.   Kerin Salen 10/09/2013, 10:29 PM

## 2013-10-09 NOTE — Progress Notes (Signed)
CSW following Pt. Pt not medically ready for d/c.    [facility]  [address];  [phone] Phone: [phone]

## 2013-10-09 NOTE — Progress Notes (Signed)
Occupational Therapy Treatment Patient Details Name: Gary Reynolds MRN: [id number] DOB: 10/08/1940 Today's Date: 10/09/2013    History of present illness 73 y.o. 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 MRI revealed acute ischemic left cornea radiata CVA.   OT comments  Awake upon arrival and stated "help me, my tailbone hurts".  Pt. Remains total assist for bed mobility.  RN assisted with repositioning pt. Pt. Able to answer yes/no and respond with increased time.  Cont. Work with RUE in prep for increasing function of this extremity for use with ADLS.    Follow Up Recommendations  SNF    Equipment Recommendations  None recommended by OT          Precautions / Restrictions   NPO today, procedure pending for PEG placement      Mobility Bed Mobility Overal bed mobility: Needs Assistance Bed Mobility: Rolling Rolling: Total assist         General bed mobility comments: RN assisted with pulling pt. towards HOB and rolling to r side and repositioning of pillows as pt. had c/o tail bone pain  Transfers                                                         ADL       Grooming: Wash/dry hands;Wash/dry face;Maximal assistance;Total assistance;Bed level Grooming Details (indicate cue type and reason): hand over hand assistance to bring UE to face                               General ADL Comments: pt. able to move right hand and open from closed flexed grasp to open with inst./demonstrational cues and increased time for completion.  also able to raise RU and bring towards mouth.  noted to be very shaky and weak, unable to bring fully to mouth but demonstrated great effort                                                                                  Exercises  ROM to RUE, pt. Able to follow inst. For flexion/extension of digits and movement of RUE in functional ways                Pertinent Vitals/ Pain      Pt. Verbalized "tailbone" pain, repositioned and rn provided pain meds                                                          Frequency Min 2X/week     Progress Toward Goals  OT Goals(current goals can now be found in  the care plan section)  Progress towards OT goals: Progressing toward goals     Plan Discharge plan remains appropriate                     End of Session     Activity Tolerance Patient limited by fatigue   Patient Left in bed;with call bell/phone within reach;with bed alarm set   Nurse Communication  rn informed of pain, and provided meds        Time: 6440-3474 OT Time Calculation (min): 24 min  Charges: OT General Charges $OT Visit: 1 Procedure OT Treatments $Self Care/Home Management : 23-37 mins  Rico Junker Rainn Zupko, COTA/L 10/09/2013, 8:07 AM

## 2013-10-09 NOTE — Discharge Instructions (Signed)
Lung Nodule - repeat CXR in 4 weeks.

## 2013-10-10 ENCOUNTER — Inpatient Hospital Stay (HOSPITAL_COMMUNITY): Payer: Medicare Other

## 2013-10-10 DIAGNOSIS — I6350 Cerebral infarction due to unspecified occlusion or stenosis of unspecified cerebral artery: Secondary | ICD-10-CM | POA: Diagnosis not present

## 2013-10-10 DIAGNOSIS — R5381 Other malaise: Secondary | ICD-10-CM | POA: Diagnosis not present

## 2013-10-10 LAB — BASIC METABOLIC PANEL
BUN: 15 mg/dL (ref 6–23)
CO2: 24 mEq/L (ref 19–32)
Calcium: 8.7 mg/dL (ref 8.4–10.5)
Chloride: 111 mEq/L (ref 96–112)
Creatinine, Ser: 0.73 mg/dL (ref 0.50–1.35)
GFR calc Af Amer: >90 mL/min (ref >90)
GFR calc non Af Amer: >90 mL/min (ref >90)
GLUCOSE: 137 mg/dL — AB (ref 70–99)
POTASSIUM: 3.4 meq/L — AB (ref 3.7–5.3)
Sodium: 149 mEq/L — ABNORMAL HIGH (ref 137–147)

## 2013-10-10 LAB — GLUCOSE, CAPILLARY
Glucose-Capillary: 106 mg/dL — ABNORMAL HIGH (ref 70–99)
Glucose-Capillary: 113 mg/dL — ABNORMAL HIGH (ref 70–99)

## 2013-10-10 LAB — CBC
HEMATOCRIT: 40.2 % (ref 39.0–52.0)
HEMOGLOBIN: 13.8 g/dL (ref 13.0–17.0)
MCH: 31.7 pg (ref 26.0–34.0)
MCHC: 34.3 g/dL (ref 30.0–36.0)
MCV: 92.2 fL (ref 78.0–100.0)
Platelets: 203 10*3/uL (ref 150–400)
RBC: 4.36 MIL/uL (ref 4.22–5.81)
RDW: 13.8 % (ref 11.5–15.5)
WBC: 10.4 10*3/uL (ref 4.0–10.5)

## 2013-10-10 LAB — PROTIME-INR
INR: 1.16 (ref 0.00–1.49)
Prothrombin Time: 14.6 seconds (ref 11.6–15.2)

## 2013-10-10 LAB — HEPARIN LEVEL (UNFRACTIONATED): Heparin Unfractionated: <0.1 IU/mL — ABNORMAL LOW (ref 0.30–0.70)

## 2013-10-10 MED ORDER — MIDAZOLAM HCL 2 MG/2ML IJ SOLN
INTRAMUSCULAR | Status: AC | PRN
  Administered 2013-10-10 (×3): 1 mg via INTRAVENOUS

## 2013-10-10 MED ORDER — DULOXETINE HCL 20 MG PO CPEP
20.0000 mg | ORAL_CAPSULE | Freq: Every day | ORAL | Status: DC
  Administered 2013-10-10 – 2013-10-12 (×3): 20 mg via ORAL
  Filled 2013-10-10 (×3): qty 1

## 2013-10-10 MED ORDER — PANTOPRAZOLE SODIUM 40 MG PO PACK
40.0000 mg | PACK | Freq: Every day | ORAL | Status: DC
  Administered 2013-10-10 – 2013-10-12 (×3): 40 mg
  Filled 2013-10-10 (×3): qty 20

## 2013-10-10 MED ORDER — GELATIN ABSORBABLE 12-7 MM EX MISC
CUTANEOUS | Status: AC
  Filled 2013-10-10: qty 1

## 2013-10-10 MED ORDER — PANTOPRAZOLE SODIUM 40 MG PO TBEC: 40.0000 mg | DELAYED_RELEASE_TABLET | Freq: Every day | ORAL | Status: DC

## 2013-10-10 MED ORDER — MIDAZOLAM HCL 2 MG/2ML IJ SOLN
INTRAMUSCULAR | Status: AC
  Filled 2013-10-10: qty 4

## 2013-10-10 MED ORDER — WHITE PETROLATUM GEL
Status: AC
  Administered 2013-10-10: 0.2
  Filled 2013-10-10: qty 5

## 2013-10-10 MED ORDER — VALPROIC ACID 250 MG/5ML PO SYRP
125.0000 mg | ORAL_SOLUTION | Freq: Two times a day (BID) | ORAL | Status: DC
  Administered 2013-10-10 – 2013-10-12 (×5): 125 mg
  Filled 2013-10-10 (×6): qty 2.5

## 2013-10-10 MED ORDER — DEXTROSE 5 % IV SOLN
INTRAVENOUS | Status: AC
  Administered 2013-10-10 – 2013-10-11 (×2): via INTRAVENOUS

## 2013-10-10 MED ORDER — POTASSIUM CHLORIDE IN NACL 40-0.9 MEQ/L-% IV SOLN
INTRAVENOUS | Status: DC
Start: 2013-10-10 — End: 2013-10-10
  Administered 2013-10-10: 50 mL/h via INTRAVENOUS
  Filled 2013-10-10: qty 1000

## 2013-10-10 MED ORDER — POTASSIUM CHLORIDE 10 MEQ/100ML IV SOLN
10.0000 meq | INTRAVENOUS | Status: AC
  Administered 2013-10-10 (×4): 10 meq via INTRAVENOUS
  Filled 2013-10-10 (×4): qty 100

## 2013-10-10 MED ORDER — CARVEDILOL 3.125 MG PO TABS
3.1250 mg | ORAL_TABLET | Freq: Two times a day (BID) | ORAL | Status: DC
  Administered 2013-10-10 – 2013-10-11 (×2): 3.125 mg via ORAL
  Filled 2013-10-10 (×4): qty 1

## 2013-10-10 MED ORDER — CEFAZOLIN SODIUM-DEXTROSE 2-3 GM-% IV SOLR
2.0000 g | Freq: Once | INTRAVENOUS | Status: AC
  Administered 2013-10-10: 2 g via INTRAVENOUS
  Filled 2013-10-10: qty 50

## 2013-10-10 MED ORDER — CEFAZOLIN SODIUM-DEXTROSE 2-3 GM-% IV SOLR
INTRAVENOUS | Status: AC
  Filled 2013-10-10: qty 50

## 2013-10-10 MED ORDER — DIVALPROEX SODIUM 125 MG PO CPSP: 125.0000 mg | ORAL_CAPSULE | Freq: Two times a day (BID) | ORAL | Status: DC

## 2013-10-10 MED ORDER — JEVITY 1.2 CAL PO LIQD
1000.0000 mL | ORAL | Status: DC
  Administered 2013-10-11: 1000 mL
  Administered 2013-10-12: 11:00:00
  Administered 2013-10-12: 1000 mL
  Filled 2013-10-10 (×2): qty 1000
  Filled 2013-10-10: qty 237
  Filled 2013-10-10 (×4): qty 1000

## 2013-10-10 MED ORDER — IOHEXOL 300 MG/ML  SOLN
50.0000 mL | Freq: Once | INTRAMUSCULAR | Status: AC | PRN
  Administered 2013-10-10: 15 mL

## 2013-10-10 MED ORDER — APIXABAN 5 MG PO TABS
5.0000 mg | ORAL_TABLET | Freq: Two times a day (BID) | ORAL | Status: DC
  Administered 2013-10-10 – 2013-10-11 (×2): 5 mg via ORAL
  Filled 2013-10-10 (×4): qty 1

## 2013-10-10 MED ORDER — FENTANYL CITRATE 0.05 MG/ML IJ SOLN
INTRAMUSCULAR | Status: AC | PRN
  Administered 2013-10-10: 25 ug via INTRAVENOUS
  Administered 2013-10-10: 50 ug via INTRAVENOUS

## 2013-10-10 MED ORDER — GLUCAGON HCL (RDNA) 1 MG IJ SOLR
INTRAMUSCULAR | Status: AC
  Administered 2013-10-10: 1 mg
  Filled 2013-10-10: qty 1

## 2013-10-10 MED ORDER — FENTANYL CITRATE 0.05 MG/ML IJ SOLN
INTRAMUSCULAR | Status: AC
  Filled 2013-10-10: qty 2

## 2013-10-10 NOTE — Progress Notes (Addendum)
Patient ID: Gary Reynolds, male   DOB: 09-13-1940, 73 y.o.   MRN: [id number] Agree with evaluation by Dr. Mayer Camel.  Discussed with family Hip fracture old. Has "tailbone pain" Pain is improving therefore observation fine. If recurs  or worsens then CT of lumbar spine sacrum to eval for  Insufficiency fracture. [phone].

## 2013-10-10 NOTE — Progress Notes (Signed)
INITIAL NUTRITION ASSESSMENT  DOCUMENTATION CODES Per approved criteria  -Not Applicable   INTERVENTION: Monitor magnesium, potassium, and phosphorus daily for at least 3 days, MD to replete as needed, as pt is at risk for refeeding syndrome given prolonged inadequate oral intake. Text paged MD. Once PEG is cleared for use for feedings, initiate Jevity 1.2 at 20 ml/hr and advance by 10 ml q 8 hours, to goal of 70 ml/hr. Goal regimen will provide: 2016 kcal, 94 grams protein, 1355 ml free water. Once all IVF discontinued, recommend initiation of free water flushes on 200 ml QID via tube. RD to continue to follow nutrition care plan.  NUTRITION DIAGNOSIS: Inadequate oral intake related to inability to eat as evidenced by NPO status.   Goal: Intake to meet >90% of estimated nutrition needs.  Monitor:  weight trends, lab trends, I/O's, TF initiation/tolerance  Reason for Assessment: MD Consult for TF Initiation/Management  73 y.o. male  Admitting Dx: CVA (cerebral infarction)  ASSESSMENT: PMHx significant for HTN, CAD, afib, hemorrhagic CVA 8 years ago. From SNF. Admitted with CVA.  MBSS on 5/8 - recommended NPO status. Repeat MBSS on 5/12 - recommended NPO status.  Underwent g-tube placement this morning in IR. RN reports that pt is able to use 24 hours after placement, was placed at 0915 this AM.  RD conducted limited physical exam - no significant fat or muscle mass appreciated.  Sodium elevated at 149 Potassium low at 3.4  --> ordered for NS with KCl Magnesium WNL No phosphorus available  Height: Ht Readings from Last 1 Encounters:  10/01/13 6\' 4"  (1.93 m)    Weight: Wt Readings from Last 1 Encounters:  10/01/13 216 lb 14.9 oz (98.4 kg)    Ideal Body Weight: 202 lb/91.8 kg  % Ideal Body Weight: 107%  Wt Readings from Last 10 Encounters:  10/01/13 216 lb 14.9 oz (98.4 kg)    Usual Body Weight: 218 lb at SNF per chart  % Usual Body Weight: 99%  BMI:  Body  mass index is 26.42 kg/(m^2). Overweight  Estimated Nutritional Needs: Kcal: 1900 - 2100 Protein: 95 - 110 g Fluid: at least 2 liters daily  Skin: intact  Diet Order: NPO  EDUCATION NEEDS: -No education needs identified at this time   Intake/Output Summary (Last 24 hours) at 10/10/13 1143 Last data filed at 10/10/13 0700  Gross per 24 hour  Intake      0 ml  Output    450 ml  Net   -450 ml    Last BM: 5/11  Labs:   Recent Labs Lab 10/04/13 0515  10/07/13 0553 10/08/13 0301 10/09/13 0500 10/09/13 1931 10/10/13 0459  NA 143  < > 147 149* 146  --  149*  K 3.9  < > 3.0* 3.0* 3.1* 3.8 3.4*  CL 105  < > 108 110 109  --  111  CO2 19  < > 22 22 25   --  24  BUN 18  < > 18 22 21   --  15  CREATININE 0.71  < > 0.71 0.78 0.77  --  0.73  CALCIUM 9.3  < > 9.0 9.4 9.2  --  8.7  MG 1.9  --  2.1  --   --   --   --   GLUCOSE 110*  < > 145* 116* 98  --  137*  < > = values in this interval not displayed.  CBG (last 3)   Recent Labs  10/09/13 1311  GLUCAP 112*    Scheduled Meds: . ciprofloxacin  2 drop Both Eyes Q4H while awake  . fentaNYL      . gelatin adsorbable      . midazolam      . piperacillin-tazobactam (ZOSYN)  IV  3.375 g Intravenous 3 times per day  . vancomycin  1,000 mg Intravenous Q12H    Continuous Infusions: . 0.9 % NaCl with KCl 40 mEq / L 50 mL/hr (10/10/13 1020)  . heparin Stopped (10/09/13 4709)    Past Medical History  Diagnosis Date  . Stroke   . Hypertension   . Coronary artery disease   . Atrial fibrillation   . GERD 11/25/2006    Qualifier: Diagnosis of  By: Leanne Chang MD, Bruce    . HYPERLIPIDEMIA 11/25/2006    Qualifier: Diagnosis of  By: Leanne Chang MD, Bruce      History reviewed. No pertinent past surgical history.  Inda Coke MS, RD, LDN Inpatient Registered Dietitian Pager: [phone] After-hours pager: [phone]

## 2013-10-10 NOTE — Progress Notes (Signed)
Holding off today on the chest vest due to placement of a peg tube placed this AM

## 2013-10-10 NOTE — Progress Notes (Signed)
Patient Demographics  Gary Reynolds, is a 73 y.o. male, DOB - 1940-08-07, [id number]  Admit date - 10/01/2013   Admitting Physician Thurnell Lose, MD  Outpatient Primary MD for the patient is No primary provider on file.  LOS - 9    Chief Complaint  Patient presents with  . 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    Workup here suggestive of left sided CVA ischemic, with some right-sided weakness and dysphagia, developed aspiration pneumonia, he will require a PEG tube placement which is due to be placed 10/09/2013, he was on Coumadin and post PEG tube will be transitioned to Eliquis which will be started on 10/10/2013. In the interim he received heparin drip. Has been seen by neuro who have now signed off. He also has chronic tailbone pain which could be a sacral insufficiency fracture due to osteoporosis, seen by also both by Dr. Mayer Camel and reviewed in detail by Dr. Tonita Cong, CT of the hip and pelvis stable, her orthopedics if tailbone pain worsens CT scan of the sacrum can be considered to rule out insufficiency fracture. However this is not operative per orthopedics.     Assessment & Plan    1. Decreased mental status. Due to acute ischemic  left cornea radiata CVA. Likely embolic from his heart, he has underlying A. fib and his INR was subtherapeutic with a CHADVAS 2 score of at least 4 - continue to monitor on telemetry bed, reviewed MRI MRA of the brain, head CT unremarkable, now has PEG tube and will be placed on Eliquis, this is based on detailed discussions with neurologist Dr. Leonie Man and patient's son Buddy on 10/07/2013 who agrees with it . EEG negative on 10/01/2013. Noted echo gram-carotid duplex. Clinically showing gradual but definite improvement on a daily basis. Have requested speech again to see him in continued followup. For now PEG tube placed on 10/10/2013.    2. Atrial fibrillation. Currently in rate control, as needed IV Lopressor, will switch to Eliquis per neuro on 10/10/2013 PEG tube placed.     3. History of CAD. Stable EKG and 3 sets of troponin, place on low-dose Coreg, sublingual nitroglycerin when necessary.     4. Left arm pain. Improved with stable x-ray of left humerus.      5. Hypertension. No acute issues, will place on low dose Coreg for cardioprotection along with IV hydralazine as needed.     6. GERD. Continue PPI.      7. Dyslipidemia. Resume home dose statin once taking oral diet.       8. Redness in both eyes. Per son chronic redness in left eye. We'll place on Cipro eye  drops and monitor.     9. Continued dysphagia remains at risk for continued mite to aspiration. Clinically now appears to be developing HCAP, continue head of the bed elevation, oral suctioning and NT suctioning, continues to be n.p.o., explained to son Vonna Drafts again in detail on 10/07/2013 that microaspiration cannot be completely prevented. After he failed swallow test it was decided that PEG tube will be placed, PEG tube placed on 10/10/2013 by IR.     10.HCAP - due to #9, aspiration precautions as above, oral and NT suctioning, empiric IV antibiotics. Monitor. Have added chest PT through chest vest,  sounds appear much clearer on 10/09/2013 after chest PT. Antibiotics were started on 10/06/2013.     11. Hypokalemia. Replaced IV, recheck again in the morning.     12. ? Lung Nodule - repeat CXR in 4 weeks.    13. Chronic tailbone pain. Patient ongoing for several years, discussed with orthopedics Dr. Mayer Camel and Dr. Tonita Cong, on x-ray of the hip there was question of right femoral fracture however this was ruled out on CT scan, this pain appears more musculoskeletal due to pressure from her bedbound status and pressure, according to orthopedics if pain continues we can obtain CT scan of the sacrum, and now supportive care.    14. Mild hypernatremia. Slightly dehydrated, stop normal saline and switch to D5W to 24 hours       Code Status: Full    Family Communication: detailed with Luvenia Redden on 10/01/2013 bedside, with his son Vonna Drafts over the phone on 10/02/2013 8:30 AM, left messages for Buddy again on 10/02/2013 at 7 PM, 10/03/2048 at 9 AM on phone and then 10 AM in person, left message for [name] again 10/04/2013 at 10:15 AM, 10/05/13 @ 9.30 am, 10/06/2013 @ 9:45 AM.    Vonna Drafts on phone 10/07/2013 at 8:45 AM.   Buddy on phone on 10/08/2013 at 11:40 AM, 10/09/2048 9:45 AM, 10/10/2013 8 AM     Disposition Plan:  Rehab/CIR    Procedures MRI-MRA Brain, TTE, Carotid, EEG, PICC line 10/06/2013, modified barium 10/06/2013. PEG tube on 10/10/2013, NG on 10/09/2013 now out, CT hip   Consults  Neuro   Medications  Scheduled Meds: . ciprofloxacin  2 drop Both Eyes Q4H while awake  . fentaNYL      . gelatin adsorbable      . midazolam      . piperacillin-tazobactam (ZOSYN)  IV  3.375 g Intravenous 3 times per day  . vancomycin  1,000 mg Intravenous Q12H   Continuous Infusions: . 0.9 % NaCl with KCl 40 mEq / L 50 mL/hr (10/10/13 1020)  . heparin Stopped (10/09/13 0739)   PRN Meds:.acetaminophen, fentaNYL, hydrALAZINE, metoprolol, morphine injection, nitroGLYCERIN,  senna-docusate, sodium chloride   DVT Prophylaxis  Coumadin, once INR below 2 switch to Eliquis   Lab Results  Component Value Date   PLT 203 10/10/2013   Lab Results  Component Value Date   INR 1.16 10/10/2013   INR 1.49 10/08/2013   INR 1.89* 10/07/2013     Antibiotics     Anti-infectives   Start     Dose/Rate Route Frequency Ordered Stop   10/10/13 0900  ceFAZolin (ANCEF) IVPB 2 g/50 mL premix     2 g 100 mL/hr over 30 Minutes Intravenous  Once 10/10/13 0810 10/10/13 0929   10/10/13 0841  ceFAZolin (ANCEF) 2-3 GM-% IVPB SOLR  Status:  Discontinued    Comments:  Thelma Barge   : cabinet override  10/10/13 0841 10/10/13 0845   10/09/13 0700  ceFAZolin (ANCEF) IVPB 2 g/50 mL premix    Comments:  Hang on call to IR procedure   2 g 100 mL/hr over 30 Minutes Intravenous On call 10/08/13 1022 10/10/13 0700   10/07/13 0500  vancomycin (VANCOCIN) IVPB 1000 mg/200 mL premix     1,000 mg 200 mL/hr over 60 Minutes Intravenous Every 12 hours 10/06/13 1708     10/06/13 2330  clindamycin (CLEOCIN) IVPB 600 mg  Status:  Discontinued     600 mg 100 mL/hr over 30 Minutes Intravenous 3 times per day 10/06/13 2324 10/07/13 0616   10/06/13 1715  piperacillin-tazobactam (ZOSYN) IVPB 3.375 g     3.375 g 12.5 mL/hr over 240 Minutes Intravenous 3 times per day 10/06/13 1708     10/06/13 1715  vancomycin (VANCOCIN) 1,500 mg in sodium chloride 0.9 % 500 mL IVPB     1,500 mg 250 mL/hr over 120 Minutes Intravenous  Once 10/06/13 1708 10/06/13 2108          Subjective:   [facility] today has, No headache, No chest pain, No abdominal pain - No Nausea, No new weakness tingling or numbness, No Cough - SOB. Less lethargic today, speech slightly improved. Chronic tailbone pain but no hip pain.   Objective:   Filed Vitals:   10/10/13 0906 10/10/13 0910 10/10/13 0921 10/10/13 1023  BP: 155/116 168/116 167/92 145/93  Pulse: 100 123 97 88  Temp:    97.4 F (36.3 C)  TempSrc:    Oral    Resp: 18 10 18 18   Height:      Weight:      SpO2: 97% 32% 96% 98%    Wt Readings from Last 3 Encounters:  10/01/13 98.4 kg (216 lb 14.9 oz)     Intake/Output Summary (Last 24 hours) at 10/10/13 1138 Last data filed at 10/10/13 0700  Gross per 24 hour  Intake      0 ml  Output    450 ml  Net   -450 ml     Physical Exam  Awake -still slightly weak but less lethargic, Oriented X 2, No new F.N deficits, chronic left-sided dense hemiparesis with left arm is 0/5, left foot 1/5, Normal affect ,weak cough reflex but has shown some improvement in his speech and mentation [place].AT,PERRAL, L eye slightly red (some chronic redness) improving. Supple Neck,No JVD, No cervical lymphadenopathy appriciated.  Symmetrical Chest wall movement, Good air movement bilaterally, clear breath sounds RRR,No Gallops,Rubs or new Murmurs, No Parasternal Heave +ve B.Sounds, Abd Soft, Non tender, No organomegaly appriciated, No rebound - guarding or rigidity. No Cyanosis, Clubbing or edema, No new Rash or bruise       Data Review   Micro Results Recent Results (from the past 240 hour(s))  CULTURE, BLOOD (ROUTINE X 2)     Status: None   Collection Time    10/06/13  9:05 PM      Result Value Ref Range Status   Specimen Description BLOOD LEFT HAND   Final   Special Requests BOTTLES DRAWN AEROBIC ONLY 3CC   Final   Culture  Setup Time     Final   Value: 10/07/2013 00:48     Performed at [facility]   Culture     Final   Value:        BLOOD CULTURE RECEIVED NO GROWTH TO DATE CULTURE WILL BE HELD FOR 5 DAYS BEFORE ISSUING A FINAL NEGATIVE  REPORT     Performed at [facility]   Report Status PENDING   Incomplete  CULTURE, BLOOD (ROUTINE X 2)     Status: None   Collection Time    10/06/13  9:10 PM      Result Value Ref Range Status   Specimen Description BLOOD LEFT HAND   Final   Special Requests BOTTLES DRAWN AEROBIC ONLY 3CC   Final   Culture  Setup Time     Final   Value:  10/07/2013 00:48     Performed at [facility]   Culture     Final   Value:        BLOOD CULTURE RECEIVED NO GROWTH TO DATE CULTURE WILL BE HELD FOR 5 DAYS BEFORE ISSUING A FINAL NEGATIVE REPORT     Performed at [facility]   Report Status PENDING   Incomplete  SURGICAL PCR SCREEN     Status: Abnormal   Collection Time    10/09/13  5:14 AM      Result Value Ref Range Status   MRSA, PCR NEGATIVE  NEGATIVE Final   Staphylococcus aureus POSITIVE (*) NEGATIVE Final   Comment:            The Xpert SA Assay (FDA     approved for NASAL specimens     in patients over 50 years of age),     is one component of     a comprehensive surveillance     program.  Test performance has     been validated by [employer] for patients greater     than or equal to 70 year old.     It is not intended     to diagnose infection nor to     guide or monitor treatment.    Radiology Reports Dg Chest 2 View  10/01/2013   CLINICAL DATA:  Weakness.  EXAM: CHEST  2 VIEW  COMPARISON:  PA and lateral chest 02/20/2007.  FINDINGS: The lungs are clear. Heart size is normal. There is no pneumothorax or pleural effusion. No focal bony abnormality.  IMPRESSION: No acute disease.   Electronically Signed   By: Inge Rise M.D.   On: 10/01/2013 10:28   Ct Head Wo Contrast  10/01/2013   CLINICAL DATA:  Weakness.  Lethargy.  EXAM: CT HEAD WITHOUT CONTRAST  TECHNIQUE: Contiguous axial images were obtained from the base of the skull through the vertex without intravenous contrast.  COMPARISON:  04/04/2007  FINDINGS: There is no acute intracranial hemorrhage, infarction, or mass lesion. There is an old partially calcified stroke in the right basal ganglia. There is extensive periventricular white matter lucency, worse on the right than the left, consistent with chronic ischemic changes. There is a small old right cerebellar infarct. There is diffuse atrophy with secondary slight ventricular dilatation.  No  acute osseous abnormalities. Dense calcification in the distal vertebral arteries.  IMPRESSION: No acute intracranial abnormality. Atrophy with old strokes and chronic small vessel ischemic changes. Atrophy has slightly progressed since the prior study.   Electronically Signed   By: Rozetta Nunnery M.D.   On: 10/01/2013 11:07   Mr Brain Wo Contrast  10/01/2013   CLINICAL DATA:  Increased weakness and lethargy a. question stroke.  EXAM: MRI HEAD WITHOUT CONTRAST  TECHNIQUE: Multiplanar, multiecho pulse sequences of the brain and surrounding structures were obtained without intravenous contrast.  COMPARISON:  CT head from the same day.  FINDINGS:  The diffusion-weighted images demonstrate an acute non hemorrhagic infarct involving the left coronal radiata measuring 1.5 cm. No other acute ischemia is evident.  Remote infarcts of the right basal ganglia and frontal operculum are noted. Atrophy and extensive white matter disease is present bilaterally. T2 changes are associated with the a acute infarct. Remote hemorrhages noted within the right basal ganglia and operculum air infarcts. There is associated ex vacuo dilation of the right lateral ventricle.  Flow is present in the major intracranial arteries. The globes and orbits are intact. The paranasal sinuses and mastoid air cells are clear.  IMPRESSION: 1. Acute nonhemorrhagic infarct involving the left coronal radiata. 2. Remote hemorrhagic infarcts of the right basal ganglia and frontal operculum. 3. Atrophy and diffuse white matter disease likely reflects the sequelae of chronic microvascular ischemia.   Electronically Signed   By: Lawrence Santiago M.D.   On: 10/01/2013 17:01   Dg Humerus Left  10/01/2013   CLINICAL DATA:  Left arm pain.  EXAM: LEFT HUMERUS - 2+ VIEW  COMPARISON:  None.  FINDINGS: Bones are osteopenic. No fracture or focal suspicious bony abnormality is identified. No discrete soft tissue swelling. There are degenerative changes of the  acromioclavicular joint.  IMPRESSION: Decreased bony mineralization.  No acute bony abnormality.   Electronically Signed   By: Curlene Dolphin M.D.   On: 10/01/2013 14:41    Carotids  - The vertebral arteries appear patent with antegrade flow. - Findings consistent with1- 39 percent stenosis involving the right internal carotid artery and the left internal carotid artery. - ICA/CCA ratio. right = 0.74. left = 0.52   Echo (TTE)  - Procedure narrative: Transthoracic echocardiography. Image quality was poor. The study was technically difficult, as a result of poor acoustic windows and poor sound wave transmission. - Left ventricle: The cavity size was normal. Wall thickness was normal. Systolic function was normal. The estimated ejection fraction was in the range of 60% to 65%. Regional wall motion abnormalities cannot be excluded. - Mitral valve: Calcified annulus. - Left atrium: The atrium was mildly dilated.  Impressions:  - Technically difficult; LV function appears to be preserved; focal wall motion abnormality cannot be excluded; if clinically indicated, TEE would have better sensitivity for source of embolus.   CBC  Recent Labs Lab 10/06/13 1105 10/07/13 0553 10/08/13 0301 10/09/13 0500 10/10/13 0459  WBC 14.6* 11.6* 13.2* 10.6* 10.4  HGB 14.7 14.8 14.5 13.2 13.8  HCT 42.7 43.2 42.4 39.0 40.2  PLT 201 187 197 190 203  MCV 91.4 90.4 90.6 92.2 92.2  MCH 31.5 31.0 31.0 31.2 31.7  MCHC 34.4 34.3 34.2 33.8 34.3  RDW 13.5 13.5 13.8 14.1 13.8    Chemistries   Recent Labs Lab 10/04/13 0515 10/06/13 0615 10/07/13 0553 10/08/13 0301 10/09/13 0500 10/09/13 1931 10/10/13 0459  NA 143 147 147 149* 146  --  149*  K 3.9 3.5* 3.0* 3.0* 3.1* 3.8 3.4*  CL 105 111 108 110 109  --  111  CO2 19 19 22 22 25   --  24  GLUCOSE 110* 119* 145* 116* 98  --  137*  BUN 18 20 18 22 21   --  15  CREATININE 0.71 0.69 0.71 0.78 0.77  --  0.73  CALCIUM 9.3 9.2 9.0 9.4 9.2  --  8.7  MG 1.9   --  2.1  --   --   --   --    ------------------------------------------------------------------------------------------------------------------ estimated creatinine clearance is 102.5 ml/min (by C-G  formula based on Cr of 0.73). ------------------------------------------------------------------------------------------------------------------ No results found for this basename: HGBA1C,  in the last 72 hours ------------------------------------------------------------------------------------------------------------------ No results found for this basename: CHOL, HDL, LDLCALC, TRIG, CHOLHDL, LDLDIRECT,  in the last 72 hours ------------------------------------------------------------------------------------------------------------------ No results found for this basename: TSH, T4TOTAL, FREET3, T3FREE, THYROIDAB,  in the last 72 hours ------------------------------------------------------------------------------------------------------------------ No results found for this basename: VITAMINB12, FOLATE, FERRITIN, TIBC, IRON, RETICCTPCT,  in the last 72 hours  Coagulation profile  Recent Labs Lab 10/06/13 0615 10/07/13 0553 10/07/13 1726 10/08/13 0301 10/10/13 0459  INR 2.52* 2.91* 1.89* 1.49 1.16    No results found for this basename: DDIMER,  in the last 72 hours  Cardiac Enzymes No results found for this basename: CK, CKMB, TROPONINI, MYOGLOBIN,  in the last 168 hours ------------------------------------------------------------------------------------------------------------------ No components found with this basename: POCBNP,      Time Spent in minutes   35   Thurnell Lose M.D on 10/10/2013 at 11:38 AM  Between 7am to 7pm - Pager - [phone]  After 7pm go to [email or web address] - password [email or web address]  And look for the night coverage person covering for me after hours  [facility] Office  [phone]

## 2013-10-10 NOTE — Progress Notes (Signed)
ANTICOAGULATION CONSULT NOTE - Initial Consult  Pharmacy Consult for Apixaban Indication: atrial fibrillation  Allergies  Allergen Reactions  . Codeine Other (See Comments)    Unknown reaction (per MAR)   . Food Other (See Comments)    Seafood - unknown reaction  (per MAR)  . Shellfish Allergy Other (See Comments)    Unknown reaction (per MAR)  . Sulfa Antibiotics Other (See Comments)    Unknown reaction (per MAR)  . Wellbutrin [Bupropion] Other (See Comments)    Unknown reaction (per [facility])    Patient Measurements: Height: 6\' 4"  (193 cm) Weight: 214 lb 4.6 oz (97.2 kg) IBW/kg (Calculated) : 86.8   Vital Signs: Temp: 97.4 F (36.3 C) (05/16 1023) Temp src: Oral (05/16 1023) BP: 145/93 mmHg (05/16 1023) Pulse Rate: 88 (05/16 1023)  Labs:  Recent Labs  10/07/13 1726  10/08/13 0301 10/08/13 1115 10/09/13 0500 10/09/13 0545 10/10/13 0459  HGB  --   < > 14.5  --  13.2  --  13.8  HCT  --   --  42.4  --  39.0  --  40.2  PLT  --   --  197  --  190  --  203  LABPROT 21.1*  --  17.6*  --   --   --  14.6  INR 1.89*  --  1.49  --   --   --  1.16  HEPARINUNFRC  --   < > 0.17* 0.37  --  0.37 <0.10*  CREATININE  --   --  0.78  --  0.77  --  0.73  < > = values in this interval not displayed.  Estimated Creatinine Clearance: 102.5 ml/min (by C-G formula based on Cr of 0.73).   Medical History: Past Medical History  Diagnosis Date  . Stroke   . Hypertension   . Coronary artery disease   . Atrial fibrillation   . GERD 11/25/2006    Qualifier: Diagnosis of  By: Leanne Chang MD, Bruce    . HYPERLIPIDEMIA 11/25/2006    Qualifier: Diagnosis of  By: Leanne Chang MD, Bruce        Assessment: 73yom admitted with new CVA in setting of Afib on Coumadin pta admit INR 1.8 < goal 2-3. Coumadin was held and a heparin drip was started.  Heparin drip was held since 5/15 am for planned PEG which was placed today.  Plan to start apixaban for [facility] post PEG placed. CBC stable, Cr < 1.5, age < 74, Wt >  45kg.   Goal of Therapy:  Monitor platelets by anticoagulation protocol: Yes   Plan:  Apixaban 5mg  po BID  Bonnita Nasuti Pharm.D. CPP, BCPS Clinical Pharmacist [phone] 10/10/2013 3:49 PM

## 2013-10-10 NOTE — Procedures (Signed)
20 Fr pull through G tube No comp 

## 2013-10-11 DIAGNOSIS — R1310 Dysphagia, unspecified: Secondary | ICD-10-CM

## 2013-10-11 LAB — CBC
HEMATOCRIT: 37.5 % — AB (ref 39.0–52.0)
Hemoglobin: 13 g/dL (ref 13.0–17.0)
MCH: 31.7 pg (ref 26.0–34.0)
MCHC: 34.7 g/dL (ref 30.0–36.0)
MCV: 91.5 fL (ref 78.0–100.0)
Platelets: 188 10*3/uL (ref 150–400)
RBC: 4.1 MIL/uL — ABNORMAL LOW (ref 4.22–5.81)
RDW: 13.6 % (ref 11.5–15.5)
WBC: 10.1 10*3/uL (ref 4.0–10.5)

## 2013-10-11 LAB — BASIC METABOLIC PANEL
BUN: 12 mg/dL (ref 6–23)
CALCIUM: 8.6 mg/dL (ref 8.4–10.5)
CHLORIDE: 107 meq/L (ref 96–112)
CO2: 27 mEq/L (ref 19–32)
CREATININE: 0.78 mg/dL (ref 0.50–1.35)
GFR calc non Af Amer: 88 mL/min — ABNORMAL LOW (ref >90)
Glucose, Bld: 124 mg/dL — ABNORMAL HIGH (ref 70–99)
Potassium: 3.5 mEq/L — ABNORMAL LOW (ref 3.7–5.3)
Sodium: 144 mEq/L (ref 137–147)

## 2013-10-11 LAB — GLUCOSE, CAPILLARY
GLUCOSE-CAPILLARY: 126 mg/dL — AB (ref 70–99)
GLUCOSE-CAPILLARY: 113 mg/dL — AB (ref 70–99)
GLUCOSE-CAPILLARY: 128 mg/dL — AB (ref 70–99)
Glucose-Capillary: 114 mg/dL — ABNORMAL HIGH (ref 70–99)

## 2013-10-11 LAB — PHOSPHORUS: PHOSPHORUS: 3.3 mg/dL (ref 2.3–4.6)

## 2013-10-11 LAB — MAGNESIUM: Magnesium: 2 mg/dL (ref 1.5–2.5)

## 2013-10-11 MED ORDER — SIMVASTATIN 20 MG PO TABS: 20.0000 mg | ORAL_TABLET | Freq: Every evening | ORAL | Status: DC

## 2013-10-11 MED ORDER — SIMVASTATIN 20 MG PO TABS
20.0000 mg | ORAL_TABLET | Freq: Every evening | ORAL | Status: DC
  Administered 2013-10-11 – 2013-10-12 (×2): 20 mg
  Filled 2013-10-11 (×2): qty 1

## 2013-10-11 MED ORDER — CARVEDILOL 3.125 MG PO TABS
3.1250 mg | ORAL_TABLET | Freq: Two times a day (BID) | ORAL | Status: DC
  Administered 2013-10-11: 3.125 mg
  Filled 2013-10-11 (×4): qty 1

## 2013-10-11 MED ORDER — FREE WATER
200.0000 mL | Freq: Four times a day (QID) | Status: DC
  Administered 2013-10-11 – 2013-10-12 (×6): 200 mL

## 2013-10-11 MED ORDER — APIXABAN 5 MG PO TABS
5.0000 mg | ORAL_TABLET | Freq: Two times a day (BID) | ORAL | Status: DC
  Administered 2013-10-11 – 2013-10-12 (×3): 5 mg
  Filled 2013-10-11 (×4): qty 1

## 2013-10-11 NOTE — Progress Notes (Signed)
Held chest vest today until verification if MD wants it continued post CT for chronic tailbone pain. Patient is afebrile, without leukocytosis, and has no cough on command. R. Infiltrate has improved and patient has no respiratory history. HOB is to elevated at all times.

## 2013-10-11 NOTE — Progress Notes (Signed)
PATIENT DETAILS Name: Gary Reynolds Age: 73 y.o. Sex: male Date of Birth: Dec 19, 1940 Admit Date: 10/01/2013 Admitting Physician Thurnell Lose, MD PCP:No primary provider on file.  Subjective: No major issues overnight.  Assessment/Plan: Principal Problem:   Acute CVA (cerebral infarction) - Admitted with slurring of speech, right-sided weakness and worsening lethargy, initial CT of the head done on admission was negative, INR was subtherapeutic at 1.8. Neurology was consulted, patient underwent further workup, MRI brain on 5/7 demonstrated a acute non-hemorrhagic infarct involving the left corona radiata. On admission started on overlapping heparin given subtherapeutic INR,however since INR was subtherapeutic on admission, neurology recommended starting newer anticoagulation agent with more predictable anticoagulation.Coumadin now has been discontinued, patient now has been switched to Eliquis. He continues to remains stable, with mild improvement, continues to have weakness in his right upper extremity-approximately 3/5, right lower extremity approximately 1-2/5. 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 - Further workup included a 2-D echocardiogram which showed an EF around 60-65%, bilateral carotid Doppler showed findings consistent with one-39% stenosis involving bilateral carotid arteries. LDL stable at 86, A1c stable at 5.5.  Healthcare associated PNA - likely secondary to suspected aspiration, continue with vancomycin and Zosyn-we'll plan a 7 day course, stop day of antibiotics 10/12/13. He remains afebrile and without leukocytosis.  Dysphagia - Secondary to prior hemorrhagic CVA and acute ischemic CVA - Subsequently underwent cutaneous G-tube placement on 5/16, to be started on feedings on 5/17. Will need repeat swallow evaluation while at skilled nursing facility.  History of atrial fibrillation - Stable, chronic issue -  Rate controlled with Coreg, on Eliquis for anticoagulation. Please see above.  History of CAD.  -Stable EKG and 3 sets of troponin, place on low-dose Coreg, sublingual nitroglycerin when necessary.  Hypertension - Continue with Coreg.  Dyslipidemia - Resume statin  Chronic tailbone pain. -Patient ongoing for several years, discussed with orthopedics Dr. Mayer Camel and Dr. Tonita Cong, on x-ray of the hip there was question of right femoral fracture however this was ruled out on CT scan, this pain appears more musculoskeletal due to pressure from her bedbound status and pressure, according to orthopedics if pain continues we can obtain CT scan of the sacrum, and now supportive care.  Mild hypernatremia.  -resolved, likely secondary to dehydration, stop D5W-now on peg feeds  GERD - Continue with PPI  ? Lung Nodule  - Chest x-ray on 5/14 showed a right basilar infiltrate that is improved, however a more focal residual opacity remains, radiologist suspects that this is infiltrate. Currently on antibiotics, plan is to repeat chest x-ray in 4-6 weeks. If this focal residual opacity still remains, will need a CT of the chest. - repeat CXR in 4 weeks.  Redness in both eyes.  -Per son chronic redness in left eye. We'll place on Cipro eye drops and monitor.  Redness in both eyes. - Per son chronic redness in left eye. We'll place on Cipro eye drops and monitor.  Disposition: Remain inpatient-SNF on discharge  DVT Prophylaxis: Not needed as on Eliquis  Code Status: Full code  Family Communication Left message for Buddy Wixted-voicemail  Procedures:  Peg Tube placement on 5/16  CONSULTS:  neurology and orthopedic surgery  Time spent 40 minutes-which includes 50% of the time with face-to-face with patient/ family and coordinating care related to the above assessment and plan.    MEDICATIONS: Scheduled Meds: . apixaban  5 mg Oral Q12H  .  carvedilol  3.125 mg Oral BID WC  .  ciprofloxacin  2 drop Both Eyes Q4H while awake  . DULoxetine  20 mg Oral Daily  . pantoprazole sodium  40 mg Per Tube Daily  . piperacillin-tazobactam (ZOSYN)  IV  3.375 g Intravenous 3 times per day  . Valproic Acid  125 mg Per Tube BID  . vancomycin  1,000 mg Intravenous Q12H   Continuous Infusions: . dextrose 75 mL/hr at 10/11/13 1013  . feeding supplement (JEVITY 1.2 CAL) 1,000 mL (10/11/13 1015)   PRN Meds:.acetaminophen, fentaNYL, hydrALAZINE, metoprolol, morphine injection, nitroGLYCERIN, senna-docusate, sodium chloride  Antibiotics: Anti-infectives   Start     Dose/Rate Route Frequency Ordered Stop   10/10/13 0900  ceFAZolin (ANCEF) IVPB 2 g/50 mL premix     2 g 100 mL/hr over 30 Minutes Intravenous  Once 10/10/13 0810 10/10/13 0929   10/10/13 0841  ceFAZolin (ANCEF) 2-3 GM-% IVPB SOLR  Status:  Discontinued    Comments:  Thelma Barge   : cabinet override      10/10/13 0841 10/10/13 0845   10/09/13 0700  ceFAZolin (ANCEF) IVPB 2 g/50 mL premix    Comments:  Hang on call to IR procedure   2 g 100 mL/hr over 30 Minutes Intravenous On call 10/08/13 1022 10/10/13 0700   10/07/13 0500  vancomycin (VANCOCIN) IVPB 1000 mg/200 mL premix     1,000 mg 200 mL/hr over 60 Minutes Intravenous Every 12 hours 10/06/13 1708     10/06/13 2330  clindamycin (CLEOCIN) IVPB 600 mg  Status:  Discontinued     600 mg 100 mL/hr over 30 Minutes Intravenous 3 times per day 10/06/13 2324 10/07/13 0616   10/06/13 1715  piperacillin-tazobactam (ZOSYN) IVPB 3.375 g     3.375 g 12.5 mL/hr over 240 Minutes Intravenous 3 times per day 10/06/13 1708     10/06/13 1715  vancomycin (VANCOCIN) 1,500 mg in sodium chloride 0.9 % 500 mL IVPB     1,500 mg 250 mL/hr over 120 Minutes Intravenous  Once 10/06/13 1708 10/06/13 2108       PHYSICAL EXAM: Vital signs in last 24 hours: Filed Vitals:   10/11/13 0222 10/11/13 0700 10/11/13 0800 10/11/13 1049  BP: 136/88 137/97 146/79 128/86  Pulse: 78 106 75 75    Temp: 97.7 F (36.5 C) 97.8 F (36.6 C) 97.4 F (36.3 C) 97.8 F (36.6 C)  TempSrc: Oral Oral Oral Oral  Resp: 18 20 18 19   Height:      Weight:      SpO2: 98% 97% 97% 99%    Weight change:  Filed Weights   10/01/13 2314 10/10/13 1237  Weight: 98.4 kg (216 lb 14.9 oz) 97.2 kg (214 lb 4.6 oz)   Body mass index is 26.09 kg/(m^2).   Gen Exam: Awake and mostly alert. Slow speech-but able to say simple words Neck: Supple, No JVD.   Chest: B/L Clear.   CVS: S1 S2 Regular, no murmurs.  Abdomen: soft, BS +, non tender, non distended. PEG tube in place, site clean and dry Extremities: no edema, lower extremities warm to touch. Neurologic: Right upper ext 3/5, RLE- 2/5, chronic left hemiplegia Skin: No Rash.   Wounds: N/A.   Intake/Output from previous day:  Intake/Output Summary (Last 24 hours) at 10/11/13 1148 Last data filed at 10/11/13 0801  Gross per 24 hour  Intake      0 ml  Output   1150 ml  Net  -1150 ml  LAB RESULTS: CBC  Recent Labs Lab 10/07/13 0553 10/08/13 0301 10/09/13 0500 10/10/13 0459 10/11/13 0445  WBC 11.6* 13.2* 10.6* 10.4 10.1  HGB 14.8 14.5 13.2 13.8 13.0  HCT 43.2 42.4 39.0 40.2 37.5*  PLT 187 197 190 203 188  MCV 90.4 90.6 92.2 92.2 91.5  MCH 31.0 31.0 31.2 31.7 31.7  MCHC 34.3 34.2 33.8 34.3 34.7  RDW 13.5 13.8 14.1 13.8 13.6    Chemistries   Recent Labs Lab 10/07/13 0553 10/08/13 0301 10/09/13 0500 10/09/13 1931 10/10/13 0459 10/11/13 0445  NA 147 149* 146  --  149* 144  K 3.0* 3.0* 3.1* 3.8 3.4* 3.5*  CL 108 110 109  --  111 107  CO2 22 22 25   --  24 27  GLUCOSE 145* 116* 98  --  137* 124*  BUN 18 22 21   --  15 12  CREATININE 0.71 0.78 0.77  --  0.73 0.78  CALCIUM 9.0 9.4 9.2  --  8.7 8.6  MG 2.1  --   --   --   --  2.0    CBG:  Recent Labs Lab 10/09/13 1311 10/10/13 1646 10/10/13 2142 10/11/13 0433 10/11/13 1117  GLUCAP 112* 106* 113* 114* 126*    GFR Estimated Creatinine Clearance: 102.5 ml/min (by  C-G formula based on Cr of 0.78).  Coagulation profile  Recent Labs Lab 10/06/13 0615 10/07/13 0553 10/07/13 1726 10/08/13 0301 10/10/13 0459  INR 2.52* 2.91* 1.89* 1.49 1.16    Cardiac Enzymes No results found for this basename: CK, CKMB, TROPONINI, MYOGLOBIN,  in the last 168 hours  No components found with this basename: POCBNP,  No results found for this basename: DDIMER,  in the last 72 hours No results found for this basename: HGBA1C,  in the last 72 hours No results found for this basename: CHOL, HDL, LDLCALC, TRIG, CHOLHDL, LDLDIRECT,  in the last 72 hours No results found for this basename: TSH, T4TOTAL, FREET3, T3FREE, THYROIDAB,  in the last 72 hours No results found for this basename: VITAMINB12, FOLATE, FERRITIN, TIBC, IRON, RETICCTPCT,  in the last 72 hours No results found for this basename: LIPASE, AMYLASE,  in the last 72 hours  Urine Studies No results found for this basename: UACOL, UAPR, USPG, UPH, UTP, UGL, UKET, UBIL, UHGB, UNIT, UROB, ULEU, UEPI, UWBC, URBC, UBAC, CAST, CRYS, UCOM, BILUA,  in the last 72 hours  MICROBIOLOGY: Recent Results (from the past 240 hour(s))  CULTURE, BLOOD (ROUTINE X 2)     Status: None   Collection Time    10/06/13  9:05 PM      Result Value Ref Range Status   Specimen Description BLOOD LEFT HAND   Final   Special Requests BOTTLES DRAWN AEROBIC ONLY 3CC   Final   Culture  Setup Time     Final   Value: 10/07/2013 00:48     Performed at [facility]   Culture     Final   Value:        BLOOD CULTURE RECEIVED NO GROWTH TO DATE CULTURE WILL BE HELD FOR 5 DAYS BEFORE ISSUING A FINAL NEGATIVE REPORT     Performed at [facility]   Report Status PENDING   Incomplete  CULTURE, BLOOD (ROUTINE X 2)     Status: None   Collection Time    10/06/13  9:10 PM      Result Value Ref Range Status   Specimen Description BLOOD LEFT HAND   Final  Special Requests BOTTLES DRAWN AEROBIC ONLY 3CC   Final   Culture  Setup Time      Final   Value: 10/07/2013 00:48     Performed at [facility]   Culture     Final   Value:        BLOOD CULTURE RECEIVED NO GROWTH TO DATE CULTURE WILL BE HELD FOR 5 DAYS BEFORE ISSUING A FINAL NEGATIVE REPORT     Performed at [facility]   Report Status PENDING   Incomplete  SURGICAL PCR SCREEN     Status: Abnormal   Collection Time    10/09/13  5:14 AM      Result Value Ref Range Status   MRSA, PCR NEGATIVE  NEGATIVE Final   Staphylococcus aureus POSITIVE (*) NEGATIVE Final   Comment:            The Xpert SA Assay (FDA     approved for NASAL specimens     in patients over 57 years of age),     is one component of     a comprehensive surveillance     program.  Test performance has     been validated by [employer] for patients greater     than or equal to 91 year old.     It is not intended     to diagnose infection nor to     guide or monitor treatment.    RADIOLOGY STUDIES/RESULTS: Dg Chest 2 View  10/08/2013   CLINICAL DATA:  Hypertension, followup infiltrates  EXAM: CHEST  2 VIEW  COMPARISON:  10/06/2013  FINDINGS: RIGHT arm PICC line stable with tip projecting over SVC.  Normal heart size, mediastinal contours and pulmonary vascularity.  Persistent mild RIGHT basilar infiltrate, perhaps slightly improved though an area of somewhat more focal density remains laterally.  Minimal LEFT base atelectasis.  Upper lungs clear.  No pleural effusion or pneumothorax.  Bones demineralized.  IMPRESSION: Slightly improved RIGHT basilar infiltrate though an area of somewhat more focal residual opacity remains, likely infiltrate but nodule is not excluded; followup radiographs until resolution recommended to exclude pulmonary nodule.   Electronically Signed   By: Lavonia Dana M.D.   On: 10/08/2013 15:32   Dg Chest 2 View  10/01/2013   CLINICAL DATA:  Weakness.  EXAM: CHEST  2 VIEW  COMPARISON:  PA and lateral chest 02/20/2007.  FINDINGS: The lungs are clear. Heart size is  normal. There is no pneumothorax or pleural effusion. No focal bony abnormality.  IMPRESSION: No acute disease.   Electronically Signed   By: Inge Rise M.D.   On: 10/01/2013 10:28   Dg Hip Bilateral W/pelvis  10/09/2013   CLINICAL DATA:  Bilateral hip pain.  EXAM: BILATERAL HIP WITH PELVIS - 4+ VIEW  COMPARISON:  CT abdomen and pelvis 12/19/2004  FINDINGS: The bones are diffusely osteopenic. There is a minimally displaced subcapital femoral neck fracture on the right. There is no dislocation. No left femur fracture is identified. Mild hip osteoarthrosis is present bilaterally. Degenerative changes are present in the lower lumbar spine.  IMPRESSION: Minimally displaced right subcapital femur fracture.   Electronically Signed   By: Logan Bores   On: 10/09/2013 17:06   Dg Abd 1 View  10/09/2013   CLINICAL DATA:  Nasogastric tube placement  EXAM: ABDOMEN - 1 VIEW  COMPARISON:  02/14/2007  FINDINGS: Tip of nasogastric tube projects over gastric antrum.  Moderate gas within stomach.  Bones appear demineralized.  IMPRESSION: Tip of nasogastric tube projects over gastric antrum.   Electronically Signed   By: Lavonia Dana M.D.   On: 10/09/2013 09:47   Ct Head Wo Contrast  10/03/2013   CLINICAL DATA:  Lethargy.  Confusion.  EXAM: CT HEAD WITHOUT CONTRAST  TECHNIQUE: Contiguous axial images were obtained from the base of the skull through the vertex without intravenous contrast.  COMPARISON:  10/01/2013 and 04/04/2007  FINDINGS: The ventricles and cisterns are within normal. There is mild age related atrophic change present chores moderate chronic ischemic microvascular disease unchanged. Old infarct over the right basal ganglia. No focal mass, mass effect or shift of midline structures. No evidence of acute hemorrhage. No definite progression of patient's known small acute focal infarct over the left corona radiata. Remainder of the exam is unchanged.  IMPRESSION: No definite progression of patient's known  small focal acute infarct over the left corona radiata.  Moderate chronic ischemic microvascular disease with old right basal ganglia infarct and age related atrophy.   Electronically Signed   By: Marin Olp M.D.   On: 10/03/2013 17:10   Ct Head Wo Contrast  10/01/2013   CLINICAL DATA:  Weakness.  Lethargy.  EXAM: CT HEAD WITHOUT CONTRAST  TECHNIQUE: Contiguous axial images were obtained from the base of the skull through the vertex without intravenous contrast.  COMPARISON:  04/04/2007  FINDINGS: There is no acute intracranial hemorrhage, infarction, or mass lesion. There is an old partially calcified stroke in the right basal ganglia. There is extensive periventricular white matter lucency, worse on the right than the left, consistent with chronic ischemic changes. There is a small old right cerebellar infarct. There is diffuse atrophy with secondary slight ventricular dilatation.  No acute osseous abnormalities. Dense calcification in the distal vertebral arteries.  IMPRESSION: No acute intracranial abnormality. Atrophy with old strokes and chronic small vessel ischemic changes. Atrophy has slightly progressed since the prior study.   Electronically Signed   By: Rozetta Nunnery M.D.   On: 10/01/2013 11:07   Mr Brain Wo Contrast  10/01/2013   CLINICAL DATA:  Increased weakness and lethargy a. question stroke.  EXAM: MRI HEAD WITHOUT CONTRAST  TECHNIQUE: Multiplanar, multiecho pulse sequences of the brain and surrounding structures were obtained without intravenous contrast.  COMPARISON:  CT head from the same day.  FINDINGS: The diffusion-weighted images demonstrate an acute non hemorrhagic infarct involving the left coronal radiata measuring 1.5 cm. No other acute ischemia is evident.  Remote infarcts of the right basal ganglia and frontal operculum are noted. Atrophy and extensive white matter disease is present bilaterally. T2 changes are associated with the a acute infarct. Remote hemorrhages noted within  the right basal ganglia and operculum air infarcts. There is associated ex vacuo dilation of the right lateral ventricle.  Flow is present in the major intracranial arteries. The globes and orbits are intact. The paranasal sinuses and mastoid air cells are clear.  IMPRESSION: 1. Acute nonhemorrhagic infarct involving the left coronal radiata. 2. Remote hemorrhagic infarcts of the right basal ganglia and frontal operculum. 3. Atrophy and diffuse white matter disease likely reflects the sequelae of chronic microvascular ischemia.   Electronically Signed   By: Lawrence Santiago M.D.   On: 10/01/2013 17:01   Ir Gastrostomy Tube Mod Sed  10/10/2013   CLINICAL DATA:  Stroke  EXAM: PERCUTANEOUS GASTROSTOMY  FLUOROSCOPY TIME:  2 min and 48 seconds.  MEDICATIONS AND MEDICAL HISTORY: Versed four mg, Fentanyl 100 mcg.  ANESTHESIA/SEDATION: Moderate sedation time: 25 minutes  CONTRAST:  50 cc Omnipaque 300  PROCEDURE: The procedure, risks, benefits, and alternatives were explained to the patient. Questions regarding the procedure were encouraged and answered. The patient understands and consents to the procedure.  The epigastrium was prepped with Betadine in a sterile fashion, and a sterile drape was applied covering the operative field. A sterile gown and sterile gloves were used for the procedure.  A 5-French orogastric tube is placed under fluoroscopic guidance. Scout imaging of the abdomen confirms barium within the transverse colon.  The stomach was distended with gas. Under fluoroscopic guidance, an 18 gauge needle was utilized to puncture the anterior wall of the body of the stomach. An Amplatz wire was advanced through the needle passing a T fastener into the lumen of the stomach. The T fastener was secured for gastropexy. A 9-French sheath was inserted.  A snare was advanced through the 9-French sheath. A Teena Dunk was advanced through the orogastric tube. It was snared then pulled out the oral cavity, pulling the snare,  as well. The leading edge of the gastrostomy was attached to the snare. It was then pulled down the esophagus and out the percutaneous site. It was secured in place. Contrast was injected. No complication.  FINDINGS: The image demonstrates placement of a 20-French pull-through type gastrostomy tube into the body of the stomach.  IMPRESSION: Successful 20 French pull-through gastrostomy.   Electronically Signed   By: Maryclare Bean M.D.   On: 10/10/2013 09:22   Ct Hip Right Wo Contrast  10/10/2013   CLINICAL DATA:  Right hip subcapital fracture, old versus new.  EXAM: CT OF THE RIGHT HIP WITHOUT CONTRAST  TECHNIQUE: Multidetector CT imaging was performed according to the standard protocol. Multiplanar CT image reconstructions were also generated.  COMPARISON:  Pelvis radiography 10/09/2013  FINDINGS: No evidence of right hip fracture. The imaged portions of the right bony pelvis are intact. The subcapital findings described on previous examination represent degenerative marginal osteophytes rather than subcapital fracture. Medullary sclerotic focus in the upper right femoral neck blends with the cortex, most consistent with bone island.  There is circumferential bladder wall thickening, likely from prostate enlargement. Multiple small bladder stones, measuring up to 7 mm. Colonic diverticulosis.  IMPRESSION: 1. Negative for right hip fracture. 2. Prostate enlargement with chronic bladder outlet obstruction and bladder calculi.   Electronically Signed   By: Tiburcio Pea M.D.   On: 10/10/2013 10:08   Dg Chest Port 1 View  10/06/2013   CLINICAL DATA:  Desaturating on 2 L.  EXAM: PORTABLE CHEST - 1 VIEW  COMPARISON:  10/06/2013 at 1100 hr  FINDINGS: Shallow inspiration with elevation of the right hemidiaphragm. Borderline heart size with normal pulmonary vascularity. Suggestion of developing infiltration or atelectasis in the right lung base. No pneumothorax. No pleural effusions. PICC line is unchanged in position.   IMPRESSION: Suggestion of developing infiltration or atelectasis in the right lung base. Examination is otherwise unchanged.   Electronically Signed   By: Burman Nieves M.D.   On: 10/06/2013 22:32   Dg Chest Port 1 View  10/06/2013   CLINICAL DATA:  Status post PICC line placement on the right  EXAM: PORTABLE CHEST - 1 VIEW  COMPARISON:  DG CHEST 1V PORT dated 10/05/2013  FINDINGS: The PICC line tip lies in the region of the midportion of the SVC. The lungs are adequately inflated. There is no focal infiltrate. The cardiac silhouette is top-normal in size. The pulmonary  vascularity is not engorged. The trachea is midline. There is no pleural effusion. There are degenerative changes of the left shoulder.  IMPRESSION: There is no evidence of a postprocedure complication following placement of the PICC line on the right.   Electronically Signed   By: David  Martinique   On: 10/06/2013 11:17   Dg Chest Port 1 View  10/05/2013   CLINICAL DATA:  Cough, weakness  EXAM: PORTABLE CHEST - 1 VIEW  COMPARISON:  Prior chest x-ray 10/01/2013  FINDINGS: New left basilar airspace opacity partially obscuring the left cardiac margin and left hemidiaphragm. Inspiratory volumes are low. Slightly increased pulmonary vascular congestion compared to prior. Cardiac and mediastinal contours are within normal limits. No pneumothorax. No acute osseous abnormality.  IMPRESSION: New left basilar opacity in the setting of of lower inspiratory volumes and increased pulmonary vascular congestion could represent atelectasis and dependent edema, or pneumonia in the appropriate clinical setting.   Electronically Signed   By: Jacqulynn Cadet M.D.   On: 10/05/2013 10:20   Dg Abd Portable 1v  10/10/2013   CLINICAL DATA:  Gastrostomy tube placement. Barium positioning for localization prior to procedure.  EXAM: PORTABLE ABDOMEN - 1 VIEW  COMPARISON:  DG HIP BILATERAL W/PELVIS dated 10/09/2013; DG ABD 1 VIEW dated 10/09/2013; DG ABD PORTABLE 1V  dated 02/14/2007  FINDINGS: Barium is present in the colon, including the transverse colon and splenic flexure.  IVC filter noted.  No abnormal dilated bowel.  IMPRESSION: 1. Barium outlines the colon.   Electronically Signed   By: Sherryl Barters M.D.   On: 10/10/2013 08:36   Dg Addison Bailey G Tube Plc W/fl-no Rad  10/09/2013   CLINICAL DATA: unable to place on floor   NASO G TUBE PLACEMENT WITH FLUORO  Fluoroscopy was utilized by the requesting physician.  No radiographic  interpretation.    Dg Humerus Left  10/01/2013   CLINICAL DATA:  Left arm pain.  EXAM: LEFT HUMERUS - 2+ VIEW  COMPARISON:  None.  FINDINGS: Bones are osteopenic. No fracture or focal suspicious bony abnormality is identified. No discrete soft tissue swelling. There are degenerative changes of the acromioclavicular joint.  IMPRESSION: Decreased bony mineralization.  No acute bony abnormality.   Electronically Signed   By: Curlene Dolphin M.D.   On: 10/01/2013 14:41   Dg Swallowing Func-speech Pathology  10/06/2013   [name], CCC-SLP     10/06/2013  1:32 PM Objective Swallowing Evaluation: Modified Barium Swallowing Study   Patient Details  Name: Athen Balagot MRN: [id number] Date of Birth: 1940/12/13  Today's Date: 10/06/2013 Time: 1200-1230 SLP Time Calculation (min): 30 min  Past Medical History:  Past Medical History  Diagnosis Date  . Stroke   . Hypertension   . Coronary artery disease   . Atrial fibrillation   . GERD 11/25/2006    Qualifier: Diagnosis of  By: Leanne Chang MD, Bruce    . HYPERLIPIDEMIA 11/25/2006    Qualifier: Diagnosis of  By: Leanne Chang MD, Bruce     Past Surgical History: No past surgical history on file. HPI:  73 y.o. 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 Chest x-ray, UA, head CT, EKG were  unremarkable. MRI acute nonhemorrhagic infarct involving the left  coronal  radiata, remote hemorrhagic infarcts of the right basal  ganglia and frontal operculum.  Repeat MBS today to determine  appropriateness  for po intake.       Assessment / Plan / Recommendation Clinical Impression  Dysphagia Diagnosis: Severe pharyngeal phase dysphagia;Moderate  oral phase dysphagia Clinical impression: Pt continues to demonstrate significant  sensory and motor based oropharyngeal dysphagia.   Pt exhibits  oral holding, poor bolus formation and reduced posterior  propulsion.  Swallow response is significantly delayed, and could   be considered essentially absent, as  pt's vallecular and  pyriform sinuses were noted to fill with un-swallowed barium  without triggering the swallow reflex.  Maximum visual, verbal  and tactile cues were unsuccessful in achieving swallow response,  cough, or throat clear.  Bolus sizes were < 1 tsp in size during  this study.  No airway penetration or aspiration was observed,  however, pt required suction to remove barium in the pharynx when  study terminated.  Aspiration undoubtedly would have occurred  with continued or larger boluses, as when swallow reflex did  trigger (3 times throughout entire study), minimal laryngeal  movement was observed.  Pt had significant difficulty  consistently following commands to clear throat or cough. NPO  status is recommended to continue, with thorough oral care  several times a day.  Family was present during the MBS, and was  encouraged to try strengthening exercises with pt as pt  tolerates.  Son reported that the decision had been made to place  PEG tube for nutritional support.  Aspiration risk with any  consistency continues to be very high     Treatment Recommendation  Therapy as outlined in treatment plan below    Diet Recommendation NPO;Alternative means - long-term   Medication Administration: Via alternative means    Other  Recommendations Oral Care Recommendations: Oral care Q4  per protocol   Follow Up Recommendations   Skilled Nursing facility    Frequency and Duration min 2x/week  2 weeks   Pertinent Vitals/Pain VSS, pt reporting his "tailbone" hurting    SLP Swallow Goals  see plan of care   General Date of Onset: 10/01/13 HPI: 73 y.o. 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 Chest x-ray, UA, head CT, EKG  were unremarkable. MRI acute nonhemorrhagic infarct involving the  left coronal radiata, remote hemorrhagic infarcts of the right  basal ganglia and frontal operculum.  Repeat MBS today to  determine appropriateness for po intake.   Type of Study: Modified Barium Swallowing Study Reason for Referral: Objectively evaluate swallowing function Previous Swallow Assessment: MBS 10/02/13 Diet Prior to this Study: NPO Temperature Spikes Noted: No Respiratory Status: Room air History of Recent Intubation: No Behavior/Cognition: Alert;Cooperative;Distractible;Requires  cueing;Doesn't follow directions Oral Cavity - Dentition: Adequate natural dentition Oral Motor / Sensory Function: Impaired - see Bedside swallow  eval Self-Feeding Abilities: Total assist Patient Positioning: Upright in chair Baseline Vocal Quality: Clear;Low vocal intensity Volitional Cough: Cognitively unable to elicit Volitional Swallow: Unable to elicit Anatomy: Within functional limits Pharyngeal Secretions: Not observed secondary MBS    Reason for Referral Objectively evaluate swallowing function   Oral Phase Oral Preparation/Oral Phase Oral Phase: Impaired Oral - Honey Oral - Honey Teaspoon: Weak lingual manipulation;Holding of  bolus;Delayed oral transit;Reduced posterior propulsion Oral - Nectar Oral - Nectar Teaspoon: Weak lingual manipulation;Holding of  bolus;Delayed oral transit;Reduced posterior propulsion Oral - Thin Oral - Thin Teaspoon: Weak lingual manipulation;Holding of  bolus;Delayed oral  transit;Reduced posterior propulsion;Lingual  pumping Oral - Solids Oral - Puree: Not tested   Pharyngeal Phase Pharyngeal Phase Pharyngeal Phase: Impaired Pharyngeal - Honey Pharyngeal - Honey Teaspoon: Delayed swallow initiation;Premature  spillage to valleculae;Premature spillage to pyriform  sinuses;Reduced laryngeal elevation;Reduced anterior laryngeal  mobility;Reduced epiglottic inversion;Reduced pharyngeal  peristalsis;Reduced tongue base retraction;Pharyngeal residue -  valleculae;Pharyngeal residue - pyriform sinuses;Lateral channel  residue Pharyngeal - Nectar Pharyngeal - Nectar Teaspoon: Delayed swallow  initiation;Premature spillage to pyriform sinuses;Premature  spillage to valleculae;Reduced pharyngeal peristalsis;Reduced  epiglottic inversion;Reduced anterior laryngeal mobility;Reduced  laryngeal elevation;Reduced tongue base retraction;Pharyngeal  residue - valleculae;Pharyngeal residue - pyriform  sinuses;Lateral channel residue Pharyngeal - Thin Pharyngeal - Thin Teaspoon: Delayed swallow initiation;Premature  spillage to valleculae;Premature spillage to pyriform  sinuses;Reduced pharyngeal peristalsis;Reduced epiglottic  inversion;Reduced anterior laryngeal mobility;Reduced laryngeal  elevation;Reduced tongue base retraction;Pharyngeal residue -  valleculae;Pharyngeal residue - pyriform sinuses;Lateral channel  residue Pharyngeal - Solids Pharyngeal - Puree: Not tested;Premature spillage to pyriform  sinuses  Cervical Esophageal Phase    GO    Cervical Esophageal Phase Cervical Esophageal Phase: [facility]        [name] Quentin Ore [facility], [place] [phone]  [name] 10/06/2013, 1:31 PM    Dg Swallowing Func-speech Pathology  10/02/2013   Everman, CCC-SLP     10/02/2013  3:32 PM Objective Swallowing Evaluation: Modified Barium Swallowing Study   Patient Details  Name: Kyran Smethers MRN: [id number] Date of Birth: [birth year]  Today's Date: 10/02/2013 Time: [id number] SLP Time Calculation  (min): 25 min  Past Medical History:  Past Medical History  Diagnosis Date  . Stroke   . Hypertension   . Coronary artery disease   . Atrial fibrillation   . GERD 11/25/2006    Qualifier: Diagnosis of  By: Leanne Chang MD, Bruce    . HYPERLIPIDEMIA 11/25/2006    Qualifier: Diagnosis of  By: Leanne Chang MD, Bruce     Past Surgical History: No past surgical history on file. HPI:  73 y.o. 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 Chest x-ray, UA, head CT, EKG were  unremarkable. MRI acute nonhemorrhagic infarct involving the left  coronal radiata, remote hemorrhagic infarcts of the right basal  ganglia and frontal operculum.  BSE completed with recommedation  for MBS.     Assessment / Plan / Recommendation Clinical Impression  Dysphagia Diagnosis: Moderate oral phase dysphagia;Moderate  pharyngeal phase dysphagia;Severe pharyngeal phase dysphagia Clinical impression: Pt. displayed moderate oral and  moderat-severe pharyngeal sensorimotor dysphagia.  Oral holding  and transit delays requiring max verbal/visual cues to propel  boluses.  Decreased pharyngeal sensation and laryngeal elevation  leading to silent aspiration with honey consistency and mod  vallecular and max pyriform sinus residue.  Pt. demonstrated  decreased awareness and sensation and required max cues to  initate second swallow with little success to reduce residue.   Aspiration risk with any consistency is high at present,  therefore recommend continue NPO with continued ST for  strengthening exercises as pt. able.     Treatment Recommendation  Therapy as outlined in treatment plan below    Diet Recommendation NPO        Other  Recommendations Recommended Consults: MBS Oral Care Recommendations: Oral care BID   Follow Up Recommendations  Skilled Nursing facility    Frequency and Duration min 2x/week  2  weeks   Pertinent Vitals/Pain  Reason for Referral Objectively evaluate swallowing function   Oral Phase Oral Preparation/Oral Phase Oral Phase: Impaired Oral - Honey Oral - Honey Teaspoon: Delayed oral transit;Holding of bolus;Weak  lingual manipulation Oral - Solids Oral - Puree: Delayed oral transit;Holding of bolus;Weak lingual  manipulation   Pharyngeal Phase Pharyngeal Phase Pharyngeal Phase: Impaired Pharyngeal - Honey Pharyngeal - Honey Teaspoon: Delayed swallow initiation;Premature  spillage to valleculae;Penetration/Aspiration during  swallow;Pharyngeal residue - pyriform sinuses;Pharyngeal residue  - valleculae;Reduced tongue base retraction;Reduced laryngeal  elevation Penetration/Aspiration details (honey teaspoon): Material enters  airway, passes BELOW cords without attempt by patient to eject  out (silent aspiration) Pharyngeal - Solids Pharyngeal - Puree: Delayed swallow initiation;Premature spillage  to valleculae;Pharyngeal residue - valleculae;Pharyngeal residue  - pyriform sinuses;Reduced tongue base retraction;Reduced  laryngeal elevation  Cervical Esophageal Phase    GO    Cervical Esophageal Phase Cervical Esophageal Phase: [facility]         Cranford Mon.Shonna Chock Pager [phone]  10/02/2013    Jonetta Osgood, MD  [facility] Pager:[phone]  If 7PM-7AM, please contact night-coverage [email or web address] Password [email or web address] 10/11/2013, 11:48 AM   LOS: 10 days   **Disclaimer: This note may have been dictated with voice recognition software. Similar sounding words can inadvertently be transcribed and this note may contain transcription errors which may not have been corrected upon publication of note.**

## 2013-10-11 NOTE — Progress Notes (Signed)
Subjective: Pt without acute changes; resting quietly  Objective: Vital signs in last 24 hours: Temp:  [97.4 F (36.3 C)-98.1 F (36.7 C)] 97.4 F (36.3 C) (05/17 0800) Pulse Rate:  [74-123] 75 (05/17 0800) Resp:  [10-27] 18 (05/17 0800) BP: (136-190)/(79-127) 146/79 mmHg (05/17 0800) SpO2:  [32 %-98 %] 97 % (05/17 0800) Weight:  [214 lb 4.6 oz (97.2 kg)] 214 lb 4.6 oz (97.2 kg) (05/16 1237) Last BM Date: 10/05/13  Intake/Output from previous day: 05/16 0701 - 05/17 0700 In: -  Out: 1150 [Urine:1150] Intake/Output this shift:    G tube intact, mildly tender, insertion site ok, abd soft,ND  Lab Results:   Recent Labs  10/10/13 0459 10/11/13 0445  WBC 10.4 10.1  HGB 13.8 13.0  HCT 40.2 37.5*  PLT 203 188   BMET  Recent Labs  10/10/13 0459 10/11/13 0445  NA 149* 144  K 3.4* 3.5*  CL 111 107  CO2 24 27  GLUCOSE 137* 124*  BUN 15 12  CREATININE 0.73 0.78  CALCIUM 8.7 8.6   PT/INR  Recent Labs  10/10/13 0459  LABPROT 14.6  INR 1.16   ABG No results found for this basename: PHART, PCO2, PO2, HCO3,  in the last 72 hours  Studies/Results: Dg Hip Bilateral W/pelvis  10/09/2013   CLINICAL DATA:  Bilateral hip pain.  EXAM: BILATERAL HIP WITH PELVIS - 4+ VIEW  COMPARISON:  CT abdomen and pelvis 12/19/2004  FINDINGS: The bones are diffusely osteopenic. There is a minimally displaced subcapital femoral neck fracture on the right. There is no dislocation. No left femur fracture is identified. Mild hip osteoarthrosis is present bilaterally. Degenerative changes are present in the lower lumbar spine.  IMPRESSION: Minimally displaced right subcapital femur fracture.   Electronically Signed   By: Logan Bores   On: 10/09/2013 17:06   Dg Abd 1 View  10/09/2013   CLINICAL DATA:  Nasogastric tube placement  EXAM: ABDOMEN - 1 VIEW  COMPARISON:  02/14/2007  FINDINGS: Tip of nasogastric tube projects over gastric antrum.  Moderate gas within stomach.  Bones appear  demineralized.  IMPRESSION: Tip of nasogastric tube projects over gastric antrum.   Electronically Signed   By: Lavonia Dana M.D.   On: 10/09/2013 09:47   Ir Gastrostomy Tube Mod Sed  10/10/2013   CLINICAL DATA:  Stroke  EXAM: PERCUTANEOUS GASTROSTOMY  FLUOROSCOPY TIME:  2 min and 48 seconds.  MEDICATIONS AND MEDICAL HISTORY: Versed four mg, Fentanyl 100 mcg.  ANESTHESIA/SEDATION: Moderate sedation time: 25 minutes  CONTRAST:  50 cc Omnipaque 300  PROCEDURE: The procedure, risks, benefits, and alternatives were explained to the patient. Questions regarding the procedure were encouraged and answered. The patient understands and consents to the procedure.  The epigastrium was prepped with Betadine in a sterile fashion, and a sterile drape was applied covering the operative field. A sterile gown and sterile gloves were used for the procedure.  A 5-French orogastric tube is placed under fluoroscopic guidance. Scout imaging of the abdomen confirms barium within the transverse colon.  The stomach was distended with gas. Under fluoroscopic guidance, an 18 gauge needle was utilized to puncture the anterior wall of the body of the stomach. An Amplatz wire was advanced through the needle passing a T fastener into the lumen of the stomach. The T fastener was secured for gastropexy. A 9-French sheath was inserted.  A snare was advanced through the 9-French sheath. A Britta Mccreedy was advanced through the orogastric tube. It was snared then  pulled out the oral cavity, pulling the snare, as well. The leading edge of the gastrostomy was attached to the snare. It was then pulled down the esophagus and out the percutaneous site. It was secured in place. Contrast was injected. No complication.  FINDINGS: The image demonstrates placement of a 20-French pull-through type gastrostomy tube into the body of the stomach.  IMPRESSION: Successful 20 French pull-through gastrostomy.   Electronically Signed   By: Maryclare Bean M.D.   On: 10/10/2013  09:22   Ct Hip Right Wo Contrast  10/10/2013   CLINICAL DATA:  Right hip subcapital fracture, old versus new.  EXAM: CT OF THE RIGHT HIP WITHOUT CONTRAST  TECHNIQUE: Multidetector CT imaging was performed according to the standard protocol. Multiplanar CT image reconstructions were also generated.  COMPARISON:  Pelvis radiography 10/09/2013  FINDINGS: No evidence of right hip fracture. The imaged portions of the right bony pelvis are intact. The subcapital findings described on previous examination represent degenerative marginal osteophytes rather than subcapital fracture. Medullary sclerotic focus in the upper right femoral neck blends with the cortex, most consistent with bone island.  There is circumferential bladder wall thickening, likely from prostate enlargement. Multiple small bladder stones, measuring up to 7 mm. Colonic diverticulosis.  IMPRESSION: 1. Negative for right hip fracture. 2. Prostate enlargement with chronic bladder outlet obstruction and bladder calculi.   Electronically Signed   By: Jorje Guild M.D.   On: 10/10/2013 10:08   Dg Abd Portable 1v  10/10/2013   CLINICAL DATA:  Gastrostomy tube placement. Barium positioning for localization prior to procedure.  EXAM: PORTABLE ABDOMEN - 1 VIEW  COMPARISON:  DG HIP BILATERAL W/PELVIS dated 10/09/2013; DG ABD 1 VIEW dated 10/09/2013; DG ABD PORTABLE 1V dated 02/14/2007  FINDINGS: Barium is present in the colon, including the transverse colon and splenic flexure.  IVC filter noted.  No abnormal dilated bowel.  IMPRESSION: 1. Barium outlines the colon.   Electronically Signed   By: Sherryl Barters M.D.   On: 10/10/2013 08:36   Dg Addison Bailey G Tube Plc W/fl-no Rad  10/09/2013   CLINICAL DATA: unable to place on floor   NASO G TUBE PLACEMENT WITH FLUORO  Fluoroscopy was utilized by the requesting physician.  No radiographic  interpretation.     Anti-infectives: Anti-infectives   Start     Dose/Rate Route Frequency Ordered Stop   10/10/13 0900   ceFAZolin (ANCEF) IVPB 2 g/50 mL premix     2 g 100 mL/hr over 30 Minutes Intravenous  Once 10/10/13 0810 10/10/13 0929   10/10/13 0841  ceFAZolin (ANCEF) 2-3 GM-% IVPB SOLR  Status:  Discontinued    Comments:  Thelma Barge   : cabinet override      10/10/13 0841 10/10/13 0845   10/09/13 0700  ceFAZolin (ANCEF) IVPB 2 g/50 mL premix    Comments:  Hang on call to IR procedure   2 g 100 mL/hr over 30 Minutes Intravenous On call 10/08/13 1022 10/10/13 0700   10/07/13 0500  vancomycin (VANCOCIN) IVPB 1000 mg/200 mL premix     1,000 mg 200 mL/hr over 60 Minutes Intravenous Every 12 hours 10/06/13 1708     10/06/13 2330  clindamycin (CLEOCIN) IVPB 600 mg  Status:  Discontinued     600 mg 100 mL/hr over 30 Minutes Intravenous 3 times per day 10/06/13 2324 10/07/13 0616   10/06/13 1715  piperacillin-tazobactam (ZOSYN) IVPB 3.375 g     3.375 g 12.5 mL/hr over 240 Minutes Intravenous 3  times per day 10/06/13 1708     10/06/13 1715  vancomycin (VANCOCIN) 1,500 mg in sodium chloride 0.9 % 500 mL IVPB     1,500 mg 250 mL/hr over 120 Minutes Intravenous  Once 10/06/13 1708 10/06/13 2108      Assessment/Plan: s/p perc G tube 5/16; ok to use tube as needed;  other plans as per primary service  LOS: 10 days    D Rowe Robert 10/11/2013

## 2013-10-12 LAB — GLUCOSE, CAPILLARY
GLUCOSE-CAPILLARY: 112 mg/dL — AB (ref 70–99)
GLUCOSE-CAPILLARY: 114 mg/dL — AB (ref 70–99)
GLUCOSE-CAPILLARY: 129 mg/dL — AB (ref 70–99)
GLUCOSE-CAPILLARY: 135 mg/dL — AB (ref 70–99)
GLUCOSE-CAPILLARY: 144 mg/dL — AB (ref 70–99)
Glucose-Capillary: 111 mg/dL — ABNORMAL HIGH (ref 70–99)

## 2013-10-12 LAB — BASIC METABOLIC PANEL
BUN: 11 mg/dL (ref 6–23)
CO2: 28 mEq/L (ref 19–32)
CREATININE: 0.75 mg/dL (ref 0.50–1.35)
Calcium: 8.7 mg/dL (ref 8.4–10.5)
Chloride: 101 mEq/L (ref 96–112)
GFR calc Af Amer: >90 mL/min (ref >90)
GFR, EST NON AFRICAN AMERICAN: 89 mL/min — AB (ref >90)
Glucose, Bld: 124 mg/dL — ABNORMAL HIGH (ref 70–99)
Potassium: 3.2 mEq/L — ABNORMAL LOW (ref 3.7–5.3)
SODIUM: 141 meq/L (ref 137–147)

## 2013-10-12 LAB — PHOSPHORUS: PHOSPHORUS: 3.4 mg/dL (ref 2.3–4.6)

## 2013-10-12 LAB — MAGNESIUM: Magnesium: 2 mg/dL (ref 1.5–2.5)

## 2013-10-12 MED ORDER — SIMVASTATIN 20 MG PO TABS: 20.0000 mg | ORAL_TABLET | Freq: Every evening | ORAL | Status: DC

## 2013-10-12 MED ORDER — ALPRAZOLAM 0.5 MG PO TABS: 0.5000 mg | ORAL_TABLET | Freq: Two times a day (BID) | ORAL | Status: AC | PRN

## 2013-10-12 MED ORDER — VITAMIN D3 25 MCG (1000 UNIT) PO TABS: 1000.0000 [IU] | ORAL_TABLET | Freq: Every day | ORAL | Status: AC

## 2013-10-12 MED ORDER — AMOXICILLIN-POT CLAVULANATE 875-125 MG PO TABS: 1.0000 | ORAL_TABLET | Freq: Two times a day (BID) | ORAL | Status: DC

## 2013-10-12 MED ORDER — CARVEDILOL 6.25 MG PO TABS
6.2500 mg | ORAL_TABLET | Freq: Two times a day (BID) | ORAL | Status: DC
  Administered 2013-10-12: 6.25 mg
  Filled 2013-10-12 (×2): qty 1

## 2013-10-12 MED ORDER — GUAIFENESIN-DM 100-10 MG/5ML PO SYRP
15.0000 mL | ORAL_SOLUTION | Freq: Four times a day (QID) | ORAL | Status: DC | PRN
  Administered 2013-10-12: 15 mL
  Filled 2013-10-12: qty 15

## 2013-10-12 MED ORDER — SENNA 8.6 MG PO TABS: 2.0000 | ORAL_TABLET | Freq: Every evening | ORAL | Status: AC | PRN

## 2013-10-12 MED ORDER — OXYCODONE HCL 5 MG/5ML PO SOLN: 10.0000 mg | ORAL | Status: DC | PRN

## 2013-10-12 MED ORDER — POTASSIUM CHLORIDE 20 MEQ/15ML (10%) PO LIQD
40.0000 meq | Freq: Once | ORAL | Status: AC
  Administered 2013-10-12: 40 meq
  Filled 2013-10-12: qty 30

## 2013-10-12 MED ORDER — CYCLOBENZAPRINE HCL 5 MG PO TABS: 5.0000 mg | ORAL_TABLET | Freq: Two times a day (BID) | ORAL | Status: AC | PRN

## 2013-10-12 MED ORDER — APIXABAN 5 MG PO TABS: 5.0000 mg | ORAL_TABLET | Freq: Two times a day (BID) | ORAL | Status: AC

## 2013-10-12 MED ORDER — OXYCODONE HCL 5 MG/5ML PO SOLN
5.0000 mg | ORAL | Status: DC | PRN
  Administered 2013-10-12 (×2): 10 mg via ORAL
  Filled 2013-10-12 (×3): qty 10

## 2013-10-12 MED ORDER — GUAIFENESIN-DM 100-10 MG/5ML PO SYRP: 15.0000 mL | ORAL_SOLUTION | Freq: Four times a day (QID) | ORAL | Status: AC | PRN

## 2013-10-12 MED ORDER — JEVITY 1.2 CAL PO LIQD: 1000.0000 mL | ORAL | Status: AC

## 2013-10-12 MED ORDER — CARVEDILOL 6.25 MG PO TABS: 6.2500 mg | ORAL_TABLET | Freq: Two times a day (BID) | ORAL | Status: AC

## 2013-10-12 MED ORDER — POLYETHYLENE GLYCOL 3350 17 G PO PACK: 17.0000 g | PACK | Freq: Two times a day (BID) | ORAL | Status: AC

## 2013-10-12 MED ORDER — PANTOPRAZOLE SODIUM 40 MG PO PACK: 40.0000 mg | PACK | Freq: Every day | ORAL | Status: DC

## 2013-10-12 MED ORDER — FREE WATER: 200.0000 mL | Freq: Four times a day (QID) | Status: AC

## 2013-10-12 MED ORDER — DULOXETINE HCL 20 MG PO CPEP: 20.0000 mg | ORAL_CAPSULE | Freq: Every day | ORAL | Status: AC

## 2013-10-12 MED ORDER — VALPROIC ACID 250 MG/5ML PO SYRP: 125.0000 mg | ORAL_SOLUTION | Freq: Two times a day (BID) | ORAL | Status: DC

## 2013-10-12 NOTE — Progress Notes (Addendum)
   CSW left a message with admissions at [facility] to arrange d/c planning to facility today.   CSW also spoke with Isley Zinni [phone] concerning d/c planning back to facility today. Pt's son is agreeable to Pt returning to facility today. Pt also requested that CSW send information about Pt's diet to the facility. CSW sent dietician not for review.   Covering CSW will assist with the d/c.   Packet in the draw outside of the room.    [facility]  [address];  [phone] Phone: [phone]

## 2013-10-12 NOTE — Progress Notes (Signed)
Per MD order, PICC line removed. Cath intact at 40cm. Vaseline pressure gauze to site, pressure held x 78min. No bleeding to site. Yeni RN requested to place this information in the discharge papers for the skilled nursing facility: keep dressing CDI x 24 hours, If bleeding occurs hold pressure. [employer]

## 2013-10-12 NOTE — Progress Notes (Signed)
Not done at this time per RN

## 2013-10-12 NOTE — Progress Notes (Signed)
Report called to [facility] and given to Ashely, RN.  She was made aware that dressing on right upper arm is not to be removed until after 24 hours.  PICC Line was removed prior to being discharge.

## 2013-10-12 NOTE — Discharge Summary (Signed)
PATIENT DETAILS Name: Gary Reynolds Age: 73 y.o. Sex: male Date of Birth: 06-29-1940 MRN: [id number]. Admit Date: 10/01/2013 Admitting Physician: Thurnell Lose, MD PCP:No primary provider on file.  Recommendations for Outpatient Follow-up:  1. Needs speech therapy followup for repeat swallow evaluation in the next few weeks while at skilled nursing facility. 2. Repeat chest x-ray in 4-6 weeks to see if the nodular lesion is resolved, if not, will need a CT chest. 3. Repeat electrolytes in the next 2-3 days  4. If patient continues to have persistent lower sacral pain, consider CT scan of sacrum at some point.  PRIMARY DISCHARGE DIAGNOSIS:  Principal Problem:   CVA (cerebral infarction) Active Problems:   HYPERLIPIDEMIA   HYPERTENSION   Atrial fibrillation   GERD   NEPHROLITHIASIS, HX OF   Altered mental status   Mental status, decreased      PAST MEDICAL HISTORY: Past Medical History  Diagnosis Date  . Stroke   . Hypertension   . Coronary artery disease   . Atrial fibrillation   . GERD 11/25/2006    Qualifier: Diagnosis of  By: Leanne Chang MD, Bruce    . HYPERLIPIDEMIA 11/25/2006    Qualifier: Diagnosis of  By: Leanne Chang MD, Margart Sickles MEDICATIONS:   Medication List    STOP taking these medications       acetaminophen 500 MG tablet  Commonly known as:  TYLENOL     atenolol 25 MG tablet  Commonly known as:  TENORMIN     bisacodyl 5 MG EC tablet  Generic drug:  bisacodyl     DENOREX DAILY EX     divalproex 125 MG capsule  Commonly known as:  DEPAKOTE SPRINKLE     docusate sodium 100 MG capsule  Commonly known as:  COLACE     gabapentin 100 MG capsule  Commonly known as:  NEURONTIN     isosorbide mononitrate 30 MG 24 hr tablet  Commonly known as:  IMDUR     magnesium citrate Soln     methylphenidate 20 MG tablet  Commonly known as:  RITALIN     OLANZapine 2.5 MG tablet  Commonly known as:  ZYPREXA     omeprazole 20 MG capsule  Commonly  known as:  PRILOSEC     traMADol 100 MG 24 hr tablet  Commonly known as:  ULTRAM-ER     warfarin 2 MG tablet  Commonly known as:  COUMADIN     warfarin 3 MG tablet  Commonly known as:  COUMADIN      TAKE these medications       ALPRAZolam 0.5 MG tablet  Commonly known as:  XANAX  Place 1 tablet (0.5 mg total) into feeding tube 2 (two) times daily as needed for anxiety.     amoxicillin-clavulanate 875-125 MG per tablet  Commonly known as:  AUGMENTIN  Place 1 tablet into feeding tube 2 (two) times daily. For 3 more days from 10/12/13     apixaban 5 MG Tabs tablet  Commonly known as:  ELIQUIS  Place 1 tablet (5 mg total) into feeding tube every 12 (twelve) hours.     carvedilol 6.25 MG tablet  Commonly known as:  COREG  Place 1 tablet (6.25 mg total) into feeding tube 2 (two) times daily with a meal.     cholecalciferol 1000 UNITS tablet  Commonly known as:  VITAMIN D  Place 1 tablet (1,000 Units total) into feeding tube daily.  cyclobenzaprine 5 MG tablet  Commonly known as:  FLEXERIL  Place 1 tablet (5 mg total) into feeding tube 2 (two) times daily as needed (back pain).     DULoxetine 20 MG capsule  Commonly known as:  CYMBALTA  Take 1 capsule (20 mg total) by mouth daily.     feeding supplement (JEVITY 1.2 CAL) Liqd  Place 1,000 mLs into feeding tube continuous. 70 cc/hr     free water Soln  Place 200 mLs into feeding tube 4 (four) times daily.     guaiFENesin-dextromethorphan 100-10 MG/5ML syrup  Commonly known as:  ROBITUSSIN DM  Place 15 mLs into feeding tube every 6 (six) hours as needed for cough.     nitroGLYCERIN 0.4 MG SL tablet  Commonly known as:  NITROSTAT  Place 0.4 mg under the tongue every 5 (five) minutes as needed for chest pain.     oxyCODONE 5 MG/5ML solution  Commonly known as:  ROXICODONE  Take 10 mLs (10 mg total) by mouth every 4 (four) hours as needed for severe pain.     pantoprazole sodium 40 mg/20 mL Pack  Commonly known as:   PROTONIX  Place 20 mLs (40 mg total) into feeding tube daily.     polyethylene glycol packet  Commonly known as:  MIRALAX / GLYCOLAX  Place 17 g into feeding tube 2 (two) times daily.     senna 8.6 MG Tabs tablet  Commonly known as:  SENOKOT  Place 2 tablets (17.2 mg total) into feeding tube at bedtime as needed for mild constipation.     simvastatin 20 MG tablet  Commonly known as:  ZOCOR  Place 1 tablet (20 mg total) into feeding tube every evening.     Valproic Acid 250 MG/5ML Syrp syrup  Commonly known as:  DEPAKENE  Place 2.5 mLs (125 mg total) into feeding tube 2 (two) times daily.        ALLERGIES:   Allergies  Allergen Reactions  . Codeine Other (See Comments)    Unknown reaction (per MAR)   . Food Other (See Comments)    Seafood - unknown reaction  (per MAR)  . Shellfish Allergy Other (See Comments)    Unknown reaction (per MAR)  . Sulfa Antibiotics Other (See Comments)    Unknown reaction (per MAR)  . 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 Further evaluation revealed new right-sided weakness and dysphagia. He was then admitted for further evaluation and treatment.  CONSULTATIONS:   neurology  PERTINENT RADIOLOGIC STUDIES: Dg Chest 2 View  10/08/2013   CLINICAL DATA:  Hypertension, followup infiltrates  EXAM: CHEST  2 VIEW  COMPARISON:  10/06/2013  FINDINGS: RIGHT arm PICC line stable with tip projecting over SVC.  Normal heart size, mediastinal contours and pulmonary vascularity.  Persistent mild RIGHT basilar infiltrate,  perhaps slightly improved though an area of somewhat more focal density remains laterally.  Minimal LEFT base atelectasis.  Upper lungs clear.  No pleural effusion or pneumothorax.  Bones demineralized.  IMPRESSION: Slightly improved RIGHT basilar infiltrate though an area  of somewhat more focal residual opacity remains, likely infiltrate but nodule is not excluded; followup radiographs until resolution recommended to exclude pulmonary nodule.   Electronically Signed   By: Lavonia Dana M.D.   On: 10/08/2013 15:32   Dg Chest 2 View  10/01/2013   CLINICAL DATA:  Weakness.  EXAM: CHEST  2 VIEW  COMPARISON:  PA and lateral chest 02/20/2007.  FINDINGS: The lungs are clear. Heart size is normal. There is no pneumothorax or pleural effusion. No focal bony abnormality.  IMPRESSION: No acute disease.   Electronically Signed   By: Inge Rise M.D.   On: 10/01/2013 10:28   Dg Hip Bilateral W/pelvis  10/09/2013   CLINICAL DATA:  Bilateral hip pain.  EXAM: BILATERAL HIP WITH PELVIS - 4+ VIEW  COMPARISON:  CT abdomen and pelvis 12/19/2004  FINDINGS: The bones are diffusely osteopenic. There is a minimally displaced subcapital femoral neck fracture on the right. There is no dislocation. No left femur fracture is identified. Mild hip osteoarthrosis is present bilaterally. Degenerative changes are present in the lower lumbar spine.  IMPRESSION: Minimally displaced right subcapital femur fracture.   Electronically Signed   By: Logan Bores   On: 10/09/2013 17:06   Dg Abd 1 View  10/09/2013   CLINICAL DATA:  Nasogastric tube placement  EXAM: ABDOMEN - 1 VIEW  COMPARISON:  02/14/2007  FINDINGS: Tip of nasogastric tube projects over gastric antrum.  Moderate gas within stomach.  Bones appear demineralized.  IMPRESSION: Tip of nasogastric tube projects over gastric antrum.   Electronically Signed   By: Lavonia Dana M.D.   On: 10/09/2013 09:47   Ct Head Wo Contrast  10/03/2013   CLINICAL DATA:  Lethargy.  Confusion.  EXAM: CT  HEAD WITHOUT CONTRAST  TECHNIQUE: Contiguous axial images were obtained from the base of the skull through the vertex without intravenous contrast.  COMPARISON:  10/01/2013 and 04/04/2007  FINDINGS: The ventricles and cisterns are within normal. There is mild age related atrophic change present chores moderate chronic ischemic microvascular disease unchanged. Old infarct over the right basal ganglia. No focal mass, mass effect or shift of midline structures. No evidence of acute hemorrhage. No definite progression of patient's known small acute focal infarct over the left corona radiata. Remainder of the exam is unchanged.  IMPRESSION: No definite progression of patient's known small focal acute infarct over the left corona radiata.  Moderate chronic ischemic microvascular disease with old right basal ganglia infarct and age related atrophy.   Electronically Signed   By: Marin Olp M.D.   On: 10/03/2013 17:10   Ct Head Wo Contrast  10/01/2013   CLINICAL DATA:  Weakness.  Lethargy.  EXAM: CT HEAD WITHOUT CONTRAST  TECHNIQUE: Contiguous axial images were obtained from the base of the skull through the vertex without intravenous contrast.  COMPARISON:  04/04/2007  FINDINGS: There is no acute intracranial hemorrhage, infarction, or mass lesion. There is an old partially calcified stroke in the right basal ganglia. There is extensive periventricular white matter lucency, worse on the right than the left, consistent with chronic ischemic changes. There is a small old right cerebellar infarct. There is diffuse atrophy with secondary slight ventricular dilatation.  No acute osseous abnormalities. Dense calcification in the distal vertebral arteries.  IMPRESSION: No acute intracranial abnormality. Atrophy with old strokes and chronic small vessel ischemic changes. Atrophy has slightly progressed since the prior study.   Electronically Signed   By: Rozetta Nunnery M.D.   On: 10/01/2013  11:07   Mr Brain Wo  Contrast  10/01/2013   CLINICAL DATA:  Increased weakness and lethargy a. question stroke.  EXAM: MRI HEAD WITHOUT CONTRAST  TECHNIQUE: Multiplanar, multiecho pulse sequences of the brain and surrounding structures were obtained without intravenous contrast.  COMPARISON:  CT head from the same day.  FINDINGS: The diffusion-weighted images demonstrate an acute non hemorrhagic infarct involving the left coronal radiata measuring 1.5 cm. No other acute ischemia is evident.  Remote infarcts of the right basal ganglia and frontal operculum are noted. Atrophy and extensive white matter disease is present bilaterally. T2 changes are associated with the a acute infarct. Remote hemorrhages noted within the right basal ganglia and operculum air infarcts. There is associated ex vacuo dilation of the right lateral ventricle.  Flow is present in the major intracranial arteries. The globes and orbits are intact. The paranasal sinuses and mastoid air cells are clear.  IMPRESSION: 1. Acute nonhemorrhagic infarct involving the left coronal radiata. 2. Remote hemorrhagic infarcts of the right basal ganglia and frontal operculum. 3. Atrophy and diffuse white matter disease likely reflects the sequelae of chronic microvascular ischemia.   Electronically Signed   By: Lawrence Santiago M.D.   On: 10/01/2013 17:01   Ir Gastrostomy Tube Mod Sed  10/10/2013   CLINICAL DATA:  Stroke  EXAM: PERCUTANEOUS GASTROSTOMY  FLUOROSCOPY TIME:  2 min and 48 seconds.  MEDICATIONS AND MEDICAL HISTORY: Versed four mg, Fentanyl 100 mcg.  ANESTHESIA/SEDATION: Moderate sedation time: 25 minutes  CONTRAST:  50 cc Omnipaque 300  PROCEDURE: The procedure, risks, benefits, and alternatives were explained to the patient. Questions regarding the procedure were encouraged and answered. The patient understands and consents to the procedure.  The epigastrium was prepped with Betadine in a sterile fashion, and a sterile drape was applied covering the operative field.  A sterile gown and sterile gloves were used for the procedure.  A 5-French orogastric tube is placed under fluoroscopic guidance. Scout imaging of the abdomen confirms barium within the transverse colon.  The stomach was distended with gas. Under fluoroscopic guidance, an 18 gauge needle was utilized to puncture the anterior wall of the body of the stomach. An Amplatz wire was advanced through the needle passing a T fastener into the lumen of the stomach. The T fastener was secured for gastropexy. A 9-French sheath was inserted.  A snare was advanced through the 9-French sheath. A Britta Mccreedy was advanced through the orogastric tube. It was snared then pulled out the oral cavity, pulling the snare, as well. The leading edge of the gastrostomy was attached to the snare. It was then pulled down the esophagus and out the percutaneous site. It was secured in place. Contrast was injected. No complication.  FINDINGS: The image demonstrates placement of a 20-French pull-through type gastrostomy tube into the body of the stomach.  IMPRESSION: Successful 20 French pull-through gastrostomy.   Electronically Signed   By: Maryclare Bean M.D.   On: 10/10/2013 09:22   Ct Hip Right Wo Contrast  10/10/2013   CLINICAL DATA:  Right hip subcapital fracture, old versus new.  EXAM: CT OF THE RIGHT HIP WITHOUT CONTRAST  TECHNIQUE: Multidetector CT imaging was performed according to the standard protocol. Multiplanar CT image reconstructions were also generated.  COMPARISON:  Pelvis radiography 10/09/2013  FINDINGS: No evidence of right hip fracture. The imaged portions of the right bony pelvis are intact. The subcapital findings described on previous examination represent degenerative marginal osteophytes rather than subcapital fracture. Medullary sclerotic  focus in the upper right femoral neck blends with the cortex, most consistent with bone island.  There is circumferential bladder wall thickening, likely from prostate enlargement. Multiple  small bladder stones, measuring up to 7 mm. Colonic diverticulosis.  IMPRESSION: 1. Negative for right hip fracture. 2. Prostate enlargement with chronic bladder outlet obstruction and bladder calculi.   Electronically Signed   By: Jorje Guild M.D.   On: 10/10/2013 10:08   Dg Chest Port 1 View  10/06/2013   CLINICAL DATA:  Desaturating on 2 L.  EXAM: PORTABLE CHEST - 1 VIEW  COMPARISON:  10/06/2013 at 1100 hr  FINDINGS: Shallow inspiration with elevation of the right hemidiaphragm. Borderline heart size with normal pulmonary vascularity. Suggestion of developing infiltration or atelectasis in the right lung base. No pneumothorax. No pleural effusions. PICC line is unchanged in position.  IMPRESSION: Suggestion of developing infiltration or atelectasis in the right lung base. Examination is otherwise unchanged.   Electronically Signed   By: Lucienne Capers M.D.   On: 10/06/2013 22:32   Dg Chest Port 1 View  10/06/2013   CLINICAL DATA:  Status post PICC line placement on the right  EXAM: PORTABLE CHEST - 1 VIEW  COMPARISON:  DG CHEST 1V PORT dated 10/05/2013  FINDINGS: The PICC line tip lies in the region of the midportion of the SVC. The lungs are adequately inflated. There is no focal infiltrate. The cardiac silhouette is top-normal in size. The pulmonary vascularity is not engorged. The trachea is midline. There is no pleural effusion. There are degenerative changes of the left shoulder.  IMPRESSION: There is no evidence of a postprocedure complication following placement of the PICC line on the right.   Electronically Signed   By: David  Martinique   On: 10/06/2013 11:17   Dg Chest Port 1 View  10/05/2013   CLINICAL DATA:  Cough, weakness  EXAM: PORTABLE CHEST - 1 VIEW  COMPARISON:  Prior chest x-ray 10/01/2013  FINDINGS: New left basilar airspace opacity partially obscuring the left cardiac margin and left hemidiaphragm. Inspiratory volumes are low. Slightly increased pulmonary vascular congestion  compared to prior. Cardiac and mediastinal contours are within normal limits. No pneumothorax. No acute osseous abnormality.  IMPRESSION: New left basilar opacity in the setting of of lower inspiratory volumes and increased pulmonary vascular congestion could represent atelectasis and dependent edema, or pneumonia in the appropriate clinical setting.   Electronically Signed   By: Jacqulynn Cadet M.D.   On: 10/05/2013 10:20   Dg Abd Portable 1v  10/10/2013   CLINICAL DATA:  Gastrostomy tube placement. Barium positioning for localization prior to procedure.  EXAM: PORTABLE ABDOMEN - 1 VIEW  COMPARISON:  DG HIP BILATERAL W/PELVIS dated 10/09/2013; DG ABD 1 VIEW dated 10/09/2013; DG ABD PORTABLE 1V dated 02/14/2007  FINDINGS: Barium is present in the colon, including the transverse colon and splenic flexure.  IVC filter noted.  No abnormal dilated bowel.  IMPRESSION: 1. Barium outlines the colon.   Electronically Signed   By: Sherryl Barters M.D.   On: 10/10/2013 08:36   Dg Addison Bailey G Tube Plc W/fl-no Rad  10/09/2013   CLINICAL DATA: unable to place on floor   NASO G TUBE PLACEMENT WITH FLUORO  Fluoroscopy was utilized by the requesting physician.  No radiographic  interpretation.    Dg Humerus Left  10/01/2013   CLINICAL DATA:  Left arm pain.  EXAM: LEFT HUMERUS - 2+ VIEW  COMPARISON:  None.  FINDINGS: Bones are osteopenic. No fracture  or focal suspicious bony abnormality is identified. No discrete soft tissue swelling. There are degenerative changes of the acromioclavicular joint.  IMPRESSION: Decreased bony mineralization.  No acute bony abnormality.   Electronically Signed   By: Curlene Dolphin M.D.   On: 10/01/2013 14:41   Dg Swallowing Func-speech Pathology  10/06/2013   [name], CCC-SLP     10/06/2013  1:32 PM Objective Swallowing Evaluation: Modified Barium Swallowing Study   Patient Details  Name: Gary Reynolds MRN: [id number] Date of Birth: 01/11/1941  Today's Date: 10/06/2013 Time: 1200-1230 SLP Time  Calculation (min): 30 min  Past Medical History:  Past Medical History  Diagnosis Date  . Stroke   . Hypertension   . Coronary artery disease   . Atrial fibrillation   . GERD 11/25/2006    Qualifier: Diagnosis of  By: Leanne Chang MD, Bruce    . HYPERLIPIDEMIA 11/25/2006    Qualifier: Diagnosis of  By: Leanne Chang MD, Bruce     Past Surgical History: No past surgical history on file. HPI:  73 y.o. 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 Chest x-ray, UA, head CT, EKG were  unremarkable. MRI acute nonhemorrhagic infarct involving the left  coronal radiata, remote hemorrhagic infarcts of the right basal  ganglia and frontal operculum.  Repeat MBS today to determine  appropriateness for po intake.       Assessment / Plan / Recommendation Clinical Impression  Dysphagia Diagnosis: Severe pharyngeal phase dysphagia;Moderate  oral phase dysphagia Clinical impression: Pt continues to demonstrate significant  sensory and motor based oropharyngeal dysphagia.   Pt exhibits  oral holding, poor bolus formation and reduced posterior  propulsion.  Swallow response is significantly delayed, and could   be considered essentially absent, as  pt's vallecular and  pyriform sinuses were noted to fill with un-swallowed barium  without triggering the swallow reflex.  Maximum visual, verbal  and tactile cues were unsuccessful in achieving swallow response,  cough, or throat clear.  Bolus sizes were < 1 tsp in size during  this study.  No airway penetration or aspiration was observed,  however, pt required suction to remove barium in the pharynx when  study terminated.  Aspiration undoubtedly would have occurred  with continued or larger boluses, as when swallow reflex did  trigger (3 times throughout entire study), minimal laryngeal  movement was observed.  Pt had significant difficulty   consistently following commands to clear throat or cough. NPO  status is recommended to continue, with thorough oral care  several times a day.  Family was present during the MBS, and was  encouraged to try strengthening exercises with pt as pt  tolerates.  Son reported that the decision had been made to place  PEG tube for nutritional support.  Aspiration risk with any  consistency continues to be very high     Treatment Recommendation  Therapy as outlined in treatment plan below    Diet Recommendation NPO;Alternative means - long-term   Medication Administration: Via alternative means    Other  Recommendations Oral Care Recommendations: Oral care Q4  per protocol   Follow Up Recommendations  Skilled Nursing facility    Frequency and Duration min 2x/week  2 weeks   Pertinent Vitals/Pain VSS, pt reporting his "tailbone" hurting    SLP Swallow Goals  see plan of care   General Date of Onset:  10/01/13 HPI: 73 y.o. 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 Chest x-ray, UA, head CT, EKG  were unremarkable. MRI acute nonhemorrhagic infarct involving the  left coronal radiata, remote hemorrhagic infarcts of the right  basal ganglia and frontal operculum.  Repeat MBS today to  determine appropriateness for po intake.   Type of Study: Modified Barium Swallowing Study Reason for Referral: Objectively evaluate swallowing function Previous Swallow Assessment: MBS 10/02/13 Diet Prior to this Study: NPO Temperature Spikes Noted: No Respiratory Status: Room air History of Recent Intubation: No Behavior/Cognition: Alert;Cooperative;Distractible;Requires  cueing;Doesn't follow directions Oral Cavity - Dentition: Adequate natural dentition Oral Motor / Sensory Function: Impaired - see Bedside swallow  eval Self-Feeding Abilities: Total assist Patient Positioning: Upright in  chair Baseline Vocal Quality: Clear;Low vocal intensity Volitional Cough: Cognitively unable to elicit Volitional Swallow: Unable to elicit Anatomy: Within functional limits Pharyngeal Secretions: Not observed secondary MBS    Reason for Referral Objectively evaluate swallowing function   Oral Phase Oral Preparation/Oral Phase Oral Phase: Impaired Oral - Honey Oral - Honey Teaspoon: Weak lingual manipulation;Holding of  bolus;Delayed oral transit;Reduced posterior propulsion Oral - Nectar Oral - Nectar Teaspoon: Weak lingual manipulation;Holding of  bolus;Delayed oral transit;Reduced posterior propulsion Oral - Thin Oral - Thin Teaspoon: Weak lingual manipulation;Holding of  bolus;Delayed oral transit;Reduced posterior propulsion;Lingual  pumping Oral - Solids Oral - Puree: Not tested   Pharyngeal Phase Pharyngeal Phase Pharyngeal Phase: Impaired Pharyngeal - Honey Pharyngeal - Honey Teaspoon: Delayed swallow initiation;Premature  spillage to valleculae;Premature spillage to pyriform  sinuses;Reduced laryngeal elevation;Reduced anterior laryngeal  mobility;Reduced epiglottic inversion;Reduced pharyngeal  peristalsis;Reduced tongue base retraction;Pharyngeal residue -  valleculae;Pharyngeal residue - pyriform sinuses;Lateral channel  residue Pharyngeal - Nectar Pharyngeal - Nectar Teaspoon: Delayed swallow  initiation;Premature spillage to pyriform sinuses;Premature  spillage to valleculae;Reduced pharyngeal peristalsis;Reduced  epiglottic inversion;Reduced anterior laryngeal mobility;Reduced  laryngeal elevation;Reduced tongue base retraction;Pharyngeal  residue - valleculae;Pharyngeal residue - pyriform  sinuses;Lateral channel residue Pharyngeal - Thin Pharyngeal - Thin Teaspoon: Delayed swallow initiation;Premature  spillage to valleculae;Premature spillage to pyriform  sinuses;Reduced pharyngeal peristalsis;Reduced epiglottic  inversion;Reduced anterior laryngeal mobility;Reduced laryngeal  elevation;Reduced  tongue base retraction;Pharyngeal residue -  valleculae;Pharyngeal residue - pyriform sinuses;Lateral channel  residue Pharyngeal - Solids Pharyngeal - Puree: Not tested;Premature spillage to pyriform  sinuses  Cervical Esophageal Phase    GO    Cervical Esophageal Phase Cervical Esophageal Phase: [facility]        [name] Quentin Ore [facility], [place] [phone]  [name] 10/06/2013, 1:31 PM    Dg Swallowing Func-speech Pathology  10/02/2013   [place], CCC-SLP     10/02/2013  3:32 PM Objective Swallowing Evaluation: Modified Barium Swallowing Study   Patient Details  Name: Aster Leddon MRN: [id number] Date of Birth: Jun 02, 1940  Today's Date: 10/02/2013 Time: [id number] SLP Time Calculation (min): 25 min  Past Medical History:  Past Medical History  Diagnosis Date  . Stroke   . Hypertension   . Coronary artery disease   . Atrial fibrillation   . GERD 11/25/2006    Qualifier: Diagnosis of  By: Leanne Chang MD, Bruce    . HYPERLIPIDEMIA 11/25/2006    Qualifier: Diagnosis of  By: Leanne Chang MD, Bruce     Past Surgical History: No past surgical history on file. HPI:  73 y.o. 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 Chest x-ray, UA, head CT, EKG were  unremarkable. MRI acute nonhemorrhagic infarct involving the left  coronal radiata, remote hemorrhagic infarcts of the right basal  ganglia and frontal operculum.  BSE completed with recommedation  for MBS.     Assessment / Plan / Recommendation Clinical Impression  Dysphagia Diagnosis: Moderate oral phase dysphagia;Moderate  pharyngeal phase dysphagia;Severe pharyngeal phase dysphagia Clinical impression: Pt. displayed moderate oral and  moderat-severe pharyngeal sensorimotor dysphagia.  Oral holding  and transit delays requiring max verbal/visual cues to propel  boluses.  Decreased pharyngeal sensation and laryngeal  elevation  leading to silent aspiration with honey consistency and mod  vallecular and max pyriform sinus residue.  Pt. demonstrated  decreased awareness and sensation and required max cues to  initate second swallow with little success to reduce residue.   Aspiration risk with any consistency is high at present,  therefore recommend continue NPO with continued ST for  strengthening exercises as pt. able.     Treatment Recommendation  Therapy as outlined in treatment plan below    Diet Recommendation NPO        Other  Recommendations Recommended Consults: MBS Oral Care Recommendations: Oral care BID   Follow Up Recommendations  Skilled Nursing facility    Frequency and Duration min 2x/week  2 weeks   Pertinent Vitals/Pain             Reason for Referral Objectively evaluate swallowing function   Oral Phase Oral Preparation/Oral Phase Oral Phase: Impaired Oral - Honey Oral - Honey Teaspoon: Delayed oral transit;Holding of bolus;Weak  lingual manipulation Oral - Solids Oral - Puree: Delayed oral transit;Holding of bolus;Weak lingual  manipulation   Pharyngeal Phase Pharyngeal Phase Pharyngeal Phase: Impaired Pharyngeal - Honey Pharyngeal - Honey Teaspoon: Delayed swallow initiation;Premature  spillage to valleculae;Penetration/Aspiration during  swallow;Pharyngeal residue - pyriform sinuses;Pharyngeal residue  - valleculae;Reduced tongue base retraction;Reduced laryngeal  elevation Penetration/Aspiration details (honey teaspoon): Material enters  airway, passes BELOW cords without attempt by patient to eject  out (silent aspiration) Pharyngeal - Solids Pharyngeal - Puree: Delayed swallow initiation;Premature spillage  to valleculae;Pharyngeal residue - valleculae;Pharyngeal residue  - pyriform sinuses;Reduced tongue base retraction;Reduced  laryngeal elevation  Cervical Esophageal Phase    GO    Cervical Esophageal Phase Cervical Esophageal Phase: [facility]         Cranford Mon.Ed CCC-SLP Pager [phone]  10/02/2013      PERTINENT LAB RESULTS: CBC:  Recent Labs  10/10/13 0459 10/11/13 0445  WBC 10.4 10.1  HGB 13.8 13.0  HCT 40.2 37.5*  PLT 203 188   CMET CMP     Component Value Date/Time   NA 141 10/12/2013 0500   K 3.2* 10/12/2013 0500   CL 101 10/12/2013 0500   CO2 28 10/12/2013 0500   GLUCOSE 124* 10/12/2013 0500   BUN 11 10/12/2013 0500   CREATININE 0.75 10/12/2013 0500   CALCIUM 8.7 10/12/2013 0500   PROT 7.2 10/01/2013 0932   ALBUMIN 3.5 10/01/2013 0932   AST 21 10/01/2013 0932   ALT 17 10/01/2013 0932   ALKPHOS 70 10/01/2013 0932   BILITOT 0.3 10/01/2013 0932   GFRNONAA 89* 10/12/2013 0500   GFRAA >90 10/12/2013 0500    GFR Estimated Creatinine Clearance: 102.5 ml/min (by C-G formula based on Cr of 0.75). No results found for this basename: LIPASE, AMYLASE,  in the last 72 hours  No results found for this basename: CKTOTAL, CKMB, CKMBINDEX, TROPONINI,  in the last 72 hours No components found with this basename: POCBNP,  No results found for this basename: DDIMER,  in the last 72 hours No results found for this basename: HGBA1C,  in the last 72 hours No results found for this basename: CHOL, HDL, LDLCALC, TRIG, CHOLHDL, LDLDIRECT,  in the last 72 hours No results found for this basename: TSH, T4TOTAL, FREET3, T3FREE, THYROIDAB,  in the last 72 hours No results found for this basename: VITAMINB12, FOLATE, FERRITIN, TIBC, IRON, RETICCTPCT,  in the last 72 hours Coags:  Recent Labs  10/10/13 0459  INR 1.16   Microbiology: Recent Results (from the past 240 hour(s))  CULTURE, BLOOD (ROUTINE X 2)     Status: None   Collection Time    10/06/13  9:05 PM      Result Value Ref Range Status   Specimen Description BLOOD LEFT HAND   Final   Special Requests BOTTLES DRAWN AEROBIC ONLY 3CC   Final   Culture  Setup Time     Final   Value: 10/07/2013 00:48     Performed at [facility]   Culture     Final   Value:        BLOOD CULTURE RECEIVED NO GROWTH TO DATE CULTURE WILL BE HELD FOR 5  DAYS BEFORE ISSUING A FINAL NEGATIVE REPORT     Performed at [facility]   Report Status PENDING   Incomplete  CULTURE, BLOOD (ROUTINE X 2)     Status: None   Collection Time    10/06/13  9:10 PM      Result Value Ref Range Status   Specimen Description BLOOD LEFT HAND   Final   Special Requests BOTTLES DRAWN AEROBIC ONLY 3CC   Final   Culture  Setup Time     Final   Value: 10/07/2013 00:48     Performed at [facility]   Culture     Final   Value:        BLOOD CULTURE RECEIVED NO GROWTH TO DATE CULTURE WILL BE HELD FOR 5 DAYS BEFORE ISSUING A FINAL NEGATIVE REPORT     Performed at [facility]   Report Status PENDING   Incomplete  SURGICAL PCR SCREEN     Status: Abnormal   Collection Time    10/09/13  5:14 AM      Result Value Ref Range Status   MRSA, PCR NEGATIVE  NEGATIVE Final   Staphylococcus aureus POSITIVE (*) NEGATIVE Final   Comment:            The Xpert SA Assay (FDA     approved for NASAL specimens     in patients over 72 years of age),     is one component of     a comprehensive surveillance     program.  Test performance has     been validated by [employer] for patients greater     than or equal to 12 year old.     It is not intended     to diagnose infection nor to     guide or monitor treatment.     BRIEF HOSPITAL COURSE:  Acute CVA (cerebral infarction)  - Admitted with slurring of speech, right-sided weakness and worsening lethargy, initial CT of the head done on admission was negative, INR was subtherapeutic at 1.8. Neurology was consulted, patient underwent further  workup, MRI brain on 5/7 demonstrated a acute non-hemorrhagic infarct involving the left corona radiata. On admission started on overlapping heparin given subtherapeutic INR,however since INR was subtherapeutic on admission, neurology recommended starting newer anticoagulation agent with more predictable anticoagulation.Coumadin now has been discontinued, patient now  has been switched to Eliquis. He continues to remains stable, with mild improvement, continues to have weakness in his right upper extremity-approximately 3/5, right lower extremity approximately 1-2/5. 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  - Further workup included a 2-D echocardiogram which showed an EF around 60-65%, bilateral carotid Doppler showed findings consistent with one-39% stenosis involving bilateral carotid arteries. LDL stable at 86, A1c stable at 5.5.   Healthcare associated PNA - likely secondary to suspected aspiration, was treated with vancomycin and Zosyn, today is day 7, he will be transitioned to Augmentin for 3 more days on discharge.  Dysphagia  - Secondary to prior hemorrhagic CVA and acute ischemic CVA  - Subsequently underwent cutaneous G-tube placement on 5/16,  started on feedings on 5/17-tolerating well. Will need repeat swallow evaluation while at skilled nursing facility.   History of atrial fibrillation  - Stable, chronic issue  - Rate controlled with Coreg, on Eliquis for anticoagulation. Please see above.   History of CAD.  -Stable EKG and 3 sets of troponin, place on low-dose Coreg, sublingual nitroglycerin when necessary.   Hypertension  - Continue with Coreg.   Dyslipidemia  - Resume statin   Chronic tailbone pain.  -Patient ongoing for several years, discussed with orthopedics Dr. Mayer Camel and Dr. Tonita Cong, on x-ray of the hip there was question of right femoral fracture however this was ruled out on CT scan, this pain appears more musculoskeletal due to pressure from her bedbound status and pressure, according to orthopedics if pain continues we can obtain CT scan of the sacrum, and now supportive care.   Mild hypernatremia.  -resolved, likely secondary to dehydration, briefly treated with D5W-now on peg feeds   GERD  - Continue with PPI   ? Lung Nodule  - Chest x-ray on 5/14 showed a right  basilar infiltrate that is improved, however a more focal residual opacity remains, radiologist suspects that this is infiltrate. Currently on antibiotics, plan is to repeat chest x-ray in 4-6 weeks. If this focal residual opacity still remains, will need a CT of the chest.   Redness in both eyes.  -Per son chronic redness in left eye.Was on  Cipro eye drops during this admit.   TODAY-DAY OF DISCHARGE:  Subjective:   [name] today has no headache,no chest abdominal pain,no new weakness tingling or numbness, feels much better wants to go home today.   Objective:   Blood pressure 169/110, pulse 76, temperature 97.9 F (36.6 C), temperature source Oral, resp. rate 18, height 6\' 4"  (1.93 m), weight 94.5 kg (208 lb 5.4 oz), SpO2 98.00%.  Intake/Output Summary (Last 24 hours) at 10/12/13 1025 Last data filed at 10/12/13 0800  Gross per 24 hour  Intake 588.67 ml  Output   2200 ml  Net -1611.33 ml   Filed Weights   10/10/13 1237 10/11/13 1810 10/12/13 0500  Weight: 97.2 kg (214 lb 4.6 oz) 99.7 kg (219 lb 12.8 oz) 94.5 kg (208 lb 5.4 oz)    Exam Awake Alert, No new F.N deficits, [place].AT,PERRAL Supple Neck,No JVD, No cervical lymphadenopathy appriciated.  Symmetrical Chest wall movement, Good air movement bilaterally, CTAB RRR,No Gallops,Rubs or new Murmurs, No Parasternal Heave +ve  B.Sounds, Abd Soft, Non tender, No organomegaly appriciated, No rebound -guarding or rigidity. No Cyanosis, Clubbing or edema, No new Rash or bruise  DISCHARGE CONDITION: Stable  DISPOSITION: SNF  DISCHARGE INSTRUCTIONS:    Activity:  As tolerated with Full fall precautions use walker/cane & assistance as needed Aspiration precautions-head end of bed at 45 degrees, oral care  Diet recommendation: NPO-on peg feeds      Discharge Instructions   Call MD for:  severe uncontrolled pain    Complete by:  As directed      Increase activity slowly    Complete by:  As directed             Follow-up Information   Follow up with Forbes Cellar, MD. Schedule an appointment as soon as possible for a visit in 2 months.   Specialties:  Neurology, Radiology   Contact information:   [place] [address] [phone]       Follow up with GATES,ROBERT NEVILL, MD In 3 days.   Specialty:  Internal Medicine   Contact information:   [address] [place] [address] [phone]       Total Time spent on discharge equals 45 minutes.  Signed: Henreitta Leber Clemence Stillings 10/12/2013 10:25 AM  **Disclaimer: This note may have been dictated with voice recognition software. Similar sounding words can inadvertently be transcribed and this note may contain transcription errors which may not have been corrected upon publication of note.**

## 2013-10-13 LAB — CULTURE, BLOOD (ROUTINE X 2)
CULTURE: NO GROWTH
Culture: NO GROWTH

## 2014-03-25 ENCOUNTER — Other Ambulatory Visit: Payer: Self-pay | Admitting: Dermatology

## 2014-04-12 ENCOUNTER — Encounter (HOSPITAL_COMMUNITY): Payer: Self-pay | Admitting: *Deleted

## 2014-04-12 MED ORDER — CEFAZOLIN SODIUM-DEXTROSE 2-3 GM-% IV SOLR
2.0000 g | INTRAVENOUS | Status: AC
  Administered 2014-04-13: 2 g via INTRAVENOUS
  Filled 2014-04-12: qty 50

## 2014-04-12 NOTE — H&P (Signed)
  Subjective:    Patient ID: Gary Reynolds is a 73 y.o. male.  HPI  Patient referred by Dr. Elvera Lennox for treatment. 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 Pt has hx stroke and is WC bound, Hoyer lift and unable to be cared for at their facility.  Presents with son, who notes area appears to be gone.  Review of Systems     Objective:    Physical Exam  Constitutional: He is oriented to person, place, and time.  HENT:  Multiple AK Left nasal tip with scab, appr 5 mm area translucent area associated  Cardiovascular: Normal rate and normal heart sounds.  Pulmonary/Chest: Effort normal and breath sounds normal.  Neurological: He is alert and oriented to person, place, and time.  Psychiatric: He has a normal mood and affect.       Assessment:      BCC nose, focal recurrent.     Plan:      Confirmed with Laurel Dimmer unable to do Mohs given above, as this was family preference. Discussed in OR under MAC resection with frozen sections. Copy of path provided to son and he asked we use him as primary contact for scheduling. Can continue Eliquis. Discussed that despire appearance, there is still innvasive cancer present and do not rec to allow lesion to regrow before addressing. Discussed that in case wound not able to be closed primarily, prefer to leave wound open and do wound care vs. Skin graft. Pictures taken.   Irene Limbo, MD [facility] Plastic & Reconstructive Surgery [phone]

## 2014-04-12 NOTE — Progress Notes (Signed)
Spoke with Gary Reynolds, nurse for Gary Reynolds at [facility]. She verified allergies, meds and medical hx and was given pre-op instructions for Gary Reynolds. Pt to be NPO after MN, meds that he can have in AM with a little sip of water are Carvedilol, Depakote, Cymbalta, Omeprazole, Oxycodone, Duoneb - if needed, Xanax - if needed. Pt to arrive at 7:15 AM via ([facility]).  Called and spoke with pt's wife, Gary Reynolds. She is aware pt is having surgery but will not be meeting him here, states her son will be here. I called and left message on son's voicemail of time of surgery, time of arrival for pt and where he can meet pt.

## 2014-04-12 NOTE — Anesthesia Preprocedure Evaluation (Addendum)
Anesthesia Evaluation  Patient identified by MRN, date of birth, ID band Patient awake    Reviewed: Allergy & Precautions, H&P , NPO status , Patient's Chart, lab work & pertinent test results, reviewed documented beta blocker date and time   Airway Mallampati: II   Neck ROM: Full    Dental  (+) Teeth Intact   Pulmonary former smoker,  breath sounds clear to auscultation        Cardiovascular hypertension, Pt. on medications Rhythm:Regular  ECHO 5,2015 EF 60%, AF, on Eloquis   Neuro/Psych Anxiety    GI/Hepatic GERD-  Medicated,  Endo/Other    Renal/GU      Musculoskeletal   Abdominal (+)  Abdomen: soft.    Peds  Hematology   Anesthesia Other Findings   Reproductive/Obstetrics                            Anesthesia Physical Anesthesia Plan  ASA: III  Anesthesia Plan: General   Post-op Pain Management:    Induction: Intravenous  Airway Management Planned: Oral ETT  Additional Equipment:   Intra-op Plan:   Post-operative Plan: Extubation in OR  Informed Consent: I have reviewed the patients History and Physical, chart, labs and discussed the procedure including the risks, benefits and alternatives for the proposed anesthesia with the patient or authorized representative who has indicated his/her understanding and acceptance.     Plan Discussed with:   Anesthesia Plan Comments: (Need to check am labs.  Labs 09/2013 ok, also check  Eloquis status)       Anesthesia Quick Evaluation

## 2014-04-13 ENCOUNTER — Ambulatory Visit (HOSPITAL_COMMUNITY)
Admission: RE | Admit: 2014-04-13 | Discharge: 2014-04-13 | Disposition: A | Discharge status: Skilled Nursing Facility | Payer: PRIVATE HEALTH INSURANCE | Source: Ambulatory Visit | Attending: Plastic Surgery | Admitting: Plastic Surgery

## 2014-04-13 ENCOUNTER — Ambulatory Visit (HOSPITAL_COMMUNITY): Payer: PRIVATE HEALTH INSURANCE | Admitting: Anesthesiology

## 2014-04-13 ENCOUNTER — Encounter (HOSPITAL_COMMUNITY)
Admission: RE | Disposition: A | Discharge status: Skilled Nursing Facility | Payer: Self-pay | Source: Ambulatory Visit | Attending: Plastic Surgery

## 2014-04-13 DIAGNOSIS — Z882 Allergy status to sulfonamides status: Secondary | ICD-10-CM | POA: Insufficient documentation

## 2014-04-13 DIAGNOSIS — C44311 Basal cell carcinoma of skin of nose: Secondary | ICD-10-CM | POA: Diagnosis present

## 2014-04-13 DIAGNOSIS — L578 Other skin changes due to chronic exposure to nonionizing radiation: Secondary | ICD-10-CM | POA: Diagnosis not present

## 2014-04-13 DIAGNOSIS — K219 Gastro-esophageal reflux disease without esophagitis: Secondary | ICD-10-CM | POA: Diagnosis not present

## 2014-04-13 DIAGNOSIS — Z888 Allergy status to other drugs, medicaments and biological substances status: Secondary | ICD-10-CM | POA: Insufficient documentation

## 2014-04-13 DIAGNOSIS — L219 Seborrheic dermatitis, unspecified: Secondary | ICD-10-CM | POA: Insufficient documentation

## 2014-04-13 DIAGNOSIS — I10 Essential (primary) hypertension: Secondary | ICD-10-CM | POA: Insufficient documentation

## 2014-04-13 DIAGNOSIS — Z885 Allergy status to narcotic agent status: Secondary | ICD-10-CM | POA: Diagnosis not present

## 2014-04-13 DIAGNOSIS — Z91013 Allergy to seafood: Secondary | ICD-10-CM | POA: Insufficient documentation

## 2014-04-13 DIAGNOSIS — F419 Anxiety disorder, unspecified: Secondary | ICD-10-CM | POA: Diagnosis not present

## 2014-04-13 DIAGNOSIS — Z87891 Personal history of nicotine dependence: Secondary | ICD-10-CM | POA: Diagnosis not present

## 2014-04-13 HISTORY — DX: Anxiety disorder, unspecified: F41.9

## 2014-04-13 HISTORY — PX: BASAL CELL CARCINOMA EXCISION: SHX1214

## 2014-04-13 LAB — CBC
HEMATOCRIT: 43.8 % (ref 39.0–52.0)
Hemoglobin: 14.5 g/dL (ref 13.0–17.0)
MCH: 30 pg (ref 26.0–34.0)
MCHC: 33.1 g/dL (ref 30.0–36.0)
MCV: 90.5 fL (ref 78.0–100.0)
PLATELETS: 176 10*3/uL (ref 150–400)
RBC: 4.84 MIL/uL (ref 4.22–5.81)
RDW: 13.9 % (ref 11.5–15.5)
WBC: 7 10*3/uL (ref 4.0–10.5)

## 2014-04-13 LAB — BASIC METABOLIC PANEL
Anion gap: 14 (ref 5–15)
BUN: 9 mg/dL (ref 6–23)
CO2: 25 mEq/L (ref 19–32)
Calcium: 9.2 mg/dL (ref 8.4–10.5)
Chloride: 102 mEq/L (ref 96–112)
Creatinine, Ser: 0.83 mg/dL (ref 0.50–1.35)
GFR calc Af Amer: >90 mL/min (ref >90)
GFR, EST NON AFRICAN AMERICAN: 86 mL/min — AB (ref >90)
GLUCOSE: 97 mg/dL (ref 70–99)
Potassium: 5.3 mEq/L (ref 3.7–5.3)
Sodium: 141 mEq/L (ref 137–147)

## 2014-04-13 SURGERY — EXCISION, CARCINOMA, BASAL CELL, WITH FROZEN SECTION EXAMINATION
Anesthesia: General | Site: Nose

## 2014-04-13 MED ORDER — STERILE WATER FOR INJECTION IJ SOLN
INTRAMUSCULAR | Status: AC
  Filled 2014-04-13: qty 10

## 2014-04-13 MED ORDER — SUCCINYLCHOLINE CHLORIDE 20 MG/ML IJ SOLN
INTRAMUSCULAR | Status: DC | PRN
  Administered 2014-04-13: 100 mg via INTRAVENOUS

## 2014-04-13 MED ORDER — GLYCOPYRROLATE 0.2 MG/ML IJ SOLN
INTRAMUSCULAR | Status: DC | PRN
  Administered 2014-04-13 (×2): 0.2 mg via INTRAVENOUS

## 2014-04-13 MED ORDER — DEXMEDETOMIDINE HCL IN NACL 200 MCG/50ML IV SOLN
INTRAVENOUS | Status: AC
  Filled 2014-04-13: qty 50

## 2014-04-13 MED ORDER — SUCCINYLCHOLINE CHLORIDE 20 MG/ML IJ SOLN
INTRAMUSCULAR | Status: AC
  Filled 2014-04-13: qty 1

## 2014-04-13 MED ORDER — EPHEDRINE SULFATE 50 MG/ML IJ SOLN
INTRAMUSCULAR | Status: DC | PRN
  Administered 2014-04-13 (×3): 10 mg via INTRAVENOUS

## 2014-04-13 MED ORDER — LIDOCAINE HCL (CARDIAC) 20 MG/ML IV SOLN
INTRAVENOUS | Status: DC | PRN
  Administered 2014-04-13: 40 mg via INTRAVENOUS

## 2014-04-13 MED ORDER — LIDOCAINE-EPINEPHRINE 1 %-1:100000 IJ SOLN
INTRAMUSCULAR | Status: AC
  Filled 2014-04-13: qty 1

## 2014-04-13 MED ORDER — FENTANYL CITRATE 0.05 MG/ML IJ SOLN
INTRAMUSCULAR | Status: DC | PRN
  Administered 2014-04-13 (×2): 50 ug via INTRAVENOUS

## 2014-04-13 MED ORDER — FENTANYL CITRATE 0.05 MG/ML IJ SOLN
INTRAMUSCULAR | Status: AC
  Filled 2014-04-13: qty 2

## 2014-04-13 MED ORDER — CARVEDILOL 6.25 MG PO TABS
6.2500 mg | ORAL_TABLET | ORAL | Status: AC
  Administered 2014-04-13: 6.25 mg via ORAL
  Filled 2014-04-13: qty 1

## 2014-04-13 MED ORDER — ROCURONIUM BROMIDE 50 MG/5ML IV SOLN
INTRAVENOUS | Status: AC
  Filled 2014-04-13: qty 1

## 2014-04-13 MED ORDER — DIPHENHYDRAMINE HCL 50 MG/ML IJ SOLN
INTRAMUSCULAR | Status: AC
  Filled 2014-04-13: qty 1

## 2014-04-13 MED ORDER — PROMETHAZINE HCL 25 MG/ML IJ SOLN: 6.2500 mg | INTRAMUSCULAR | Status: DC | PRN

## 2014-04-13 MED ORDER — LACTATED RINGERS IV SOLN
INTRAVENOUS | Status: DC
  Administered 2014-04-13 (×2): via INTRAVENOUS

## 2014-04-13 MED ORDER — PROPOFOL 10 MG/ML IV BOLUS
INTRAVENOUS | Status: AC
  Filled 2014-04-13: qty 20

## 2014-04-13 MED ORDER — NEOSTIGMINE METHYLSULFATE 10 MG/10ML IV SOLN
INTRAVENOUS | Status: AC
  Filled 2014-04-13: qty 1

## 2014-04-13 MED ORDER — 0.9 % SODIUM CHLORIDE (POUR BTL) OPTIME
TOPICAL | Status: DC | PRN
  Administered 2014-04-13: 1000 mL

## 2014-04-13 MED ORDER — LIDOCAINE-EPINEPHRINE 1 %-1:100000 IJ SOLN
INTRAMUSCULAR | Status: DC | PRN
  Administered 2014-04-13: 20 mL

## 2014-04-13 MED ORDER — BACITRACIN-NEOMYCIN-POLYMYXIN 400-5-5000 EX OINT
TOPICAL_OINTMENT | CUTANEOUS | Status: DC | PRN
  Administered 2014-04-13: 1 via TOPICAL

## 2014-04-13 MED ORDER — MEPERIDINE HCL 25 MG/ML IJ SOLN: 6.2500 mg | INTRAMUSCULAR | Status: DC | PRN

## 2014-04-13 MED ORDER — ROCURONIUM BROMIDE 100 MG/10ML IV SOLN
INTRAVENOUS | Status: DC | PRN
  Administered 2014-04-13: 10 mg via INTRAVENOUS

## 2014-04-13 MED ORDER — LIDOCAINE HCL (CARDIAC) 20 MG/ML IV SOLN
INTRAVENOUS | Status: AC
  Filled 2014-04-13: qty 5

## 2014-04-13 MED ORDER — PROPOFOL 10 MG/ML IV BOLUS
INTRAVENOUS | Status: DC | PRN
  Administered 2014-04-13 (×2): 20 mg via INTRAVENOUS
  Administered 2014-04-13: 60 mg via INTRAVENOUS

## 2014-04-13 MED ORDER — CARVEDILOL 3.125 MG PO TABS
ORAL_TABLET | ORAL | Status: AC
  Administered 2014-04-13: 6.25 mg via ORAL
  Filled 2014-04-13: qty 2

## 2014-04-13 MED ORDER — GLYCOPYRROLATE 0.2 MG/ML IJ SOLN
INTRAMUSCULAR | Status: AC
  Filled 2014-04-13: qty 2

## 2014-04-13 MED ORDER — BACITRACIN ZINC 500 UNIT/GM EX OINT
TOPICAL_OINTMENT | CUTANEOUS | Status: AC
  Filled 2014-04-13: qty 15

## 2014-04-13 MED ORDER — KETOROLAC TROMETHAMINE 30 MG/ML IJ SOLN
INTRAMUSCULAR | Status: AC
  Filled 2014-04-13: qty 1

## 2014-04-13 MED ORDER — FENTANYL CITRATE 0.05 MG/ML IJ SOLN
25.0000 ug | INTRAMUSCULAR | Status: DC | PRN
  Administered 2014-04-13: 25 ug via INTRAVENOUS

## 2014-04-13 MED ORDER — FENTANYL CITRATE 0.05 MG/ML IJ SOLN
INTRAMUSCULAR | Status: AC
  Filled 2014-04-13: qty 5

## 2014-04-13 MED ORDER — DEXAMETHASONE SODIUM PHOSPHATE 4 MG/ML IJ SOLN
INTRAMUSCULAR | Status: AC
Start: 2014-04-13 — End: 2014-04-13
  Filled 2014-04-13: qty 1

## 2014-04-13 MED ORDER — EPHEDRINE SULFATE 50 MG/ML IJ SOLN
INTRAMUSCULAR | Status: AC
  Filled 2014-04-13: qty 1

## 2014-04-13 SURGICAL SUPPLY — 30 items
BLADE SURG 15 STRL LF DISP TIS (BLADE) IMPLANT
BLADE SURG 15 STRL SS (BLADE) ×3
CANISTER SUCT 1200ML W/VALVE (MISCELLANEOUS) ×3 IMPLANT
COVER MAYO STAND STRL (DRAPES) ×2 IMPLANT
COVER SURGICAL LIGHT HANDLE (MISCELLANEOUS) ×2 IMPLANT
COVER TABLE BACK 60X90 (DRAPES) ×3 IMPLANT
DRAPE PROXIMA HALF (DRAPES) ×3 IMPLANT
ELECT NDL BLADE 2-5/6 (NEEDLE) IMPLANT
ELECT NEEDLE BLADE 2-5/6 (NEEDLE) ×3 IMPLANT
GLOVE BIO SURGEON STRL SZ 6 (GLOVE) ×3 IMPLANT
GLOVE BIOGEL PI IND STRL 7.0 (GLOVE) IMPLANT
GLOVE BIOGEL PI INDICATOR 7.0 (GLOVE) ×2
GOWN BRE IMP SLV AUR LG STRL (GOWN DISPOSABLE) ×5 IMPLANT
KIT BASIN OR (CUSTOM PROCEDURE TRAY) ×3 IMPLANT
NEEDLE 27GAX1X1/2 (NEEDLE) ×2 IMPLANT
PACK SURGICAL SETUP 50X90 (CUSTOM PROCEDURE TRAY) ×2 IMPLANT
PENCIL BUTTON HOLSTER BLD 10FT (ELECTRODE) ×2 IMPLANT
SUCTION FRAZIER TIP 10 FR DISP (SUCTIONS) ×2 IMPLANT
SUT CHROMIC 4 0 P 3 18 (SUTURE) IMPLANT
SUT PLAIN 5 0 P 3 18 (SUTURE) ×2 IMPLANT
SUT PROLENE 2 0 CT2 30 (SUTURE) ×3 IMPLANT
SUT SILK 4 0 P 3 (SUTURE) ×2 IMPLANT
SUT VIC AB 4-0 P-3 18X BRD (SUTURE) IMPLANT
SUT VIC AB 4-0 P3 18 (SUTURE) ×3
SYR CONTROL 10ML LL (SYRINGE) ×2 IMPLANT
TOWEL OR 17X24 6PK STRL BLUE (TOWEL DISPOSABLE) ×3 IMPLANT
TUBE CONNECTING 12'X1/4 (SUCTIONS) ×1
TUBE CONNECTING 12X1/4 (SUCTIONS) ×1 IMPLANT
TUBE CONNECTING 20'X1/4 (TUBING) ×1
TUBE CONNECTING 20X1/4 (TUBING) ×2 IMPLANT

## 2014-04-13 NOTE — Anesthesia Procedure Notes (Signed)
Procedure Name: Intubation Date/Time: 04/13/2014 9:50 AM Performed by: Maude Leriche D Pre-anesthesia Checklist: Patient identified, Emergency Drugs available, Suction available, Patient being monitored and Timeout performed Patient Re-evaluated:Patient Re-evaluated prior to inductionOxygen Delivery Method: Circle system utilized Preoxygenation: Pre-oxygenation with 100% oxygen Intubation Type: IV induction Ventilation: Mask ventilation without difficulty Laryngoscope Size: Mac and 3 Grade View: Grade III Tube type: Oral Tube size: 7.5 mm Number of attempts: 1 (DL by Dr Tresa Moore) Airway Equipment and Method: Stylet Placement Confirmation: ETT inserted through vocal cords under direct vision,  positive ETCO2 and breath sounds checked- equal and bilateral Secured at: 22 cm Tube secured with: Tape Dental Injury: Teeth and Oropharynx as per pre-operative assessment

## 2014-04-13 NOTE — Interval H&P Note (Signed)
History and Physical Interval Note:  04/13/2014 8:35 AM  Gary Reynolds  has presented today for surgery, with the diagnosis of BASAL CELL CARCINOMA OF NOSE  The various methods of treatment have been discussed with the patient and family. After consideration of risks, benefits and other options for treatment, the patient has consented to  Procedure(s): EXCISION OF MALIGNANT LESION OF NOSE  (N/A) as a surgical intervention .  The patient's history has been reviewed, patient examined, no change in status, stable for surgery.  I have reviewed the patient's chart and labs.  Questions were answered to the patient's satisfaction.     Skai Lickteig

## 2014-04-13 NOTE — Discharge Instructions (Signed)
What to eat: ° °For your first meals, you should eat lightly; only small meals initially.  If you do not have nausea, you may eat larger meals.  Avoid spicy, greasy and heavy food.   ° °General Anesthesia, Adult, Care After  °Refer to this sheet in the next few weeks. These instructions provide you with information on caring for yourself after your procedure. Your health care provider may also give you more specific instructions. Your treatment has been planned according to current medical practices, but problems sometimes occur. Call your health care provider if you have any problems or questions after your procedure.  °WHAT TO EXPECT AFTER THE PROCEDURE  °After the procedure, it is typical to experience:  °Sleepiness.  °Nausea and vomiting. °HOME CARE INSTRUCTIONS  °For the first 24 hours after general anesthesia:  °Have a responsible person with you.  °Do not drive a car. If you are alone, do not take public transportation.  °Do not drink alcohol.  °Do not take medicine that has not been prescribed by your health care provider.  °Do not sign important papers or make important decisions.  °You may resume a normal diet and activities as directed by your health care provider.  °Change bandages (dressings) as directed.  °If you have questions or problems that seem related to general anesthesia, call the hospital and ask for the anesthetist or anesthesiologist on call. °SEEK MEDICAL CARE IF:  °You have nausea and vomiting that continue the day after anesthesia.  °You develop a rash. °SEEK IMMEDIATE MEDICAL CARE IF:  °You have difficulty breathing.  °You have chest pain.  °You have any allergic problems. °Document Released: 08/20/2000 Document Revised: 01/14/2013 Document Reviewed: 11/27/2012  °ExitCare® Patient Information ©2014 [employer].  ° °Tissue Adhesive Wound Care  ° ° °Some cuts and wounds can be closed with tissue adhesive. Adhesive is like glue. It holds the skin together and helps a wound heal faster.  This adhesive goes away on its own as the wound heals.  °HOME CARE  °Showers are allowed. Do not soak the wound in water. Do not take baths, swim, or use hot tubs. Do not use soaps or creams on your wound.  °If a bandage (dressing) was put on, change it as often as told by your doctor.  °Keep the bandage dry.  °Do not scratch, pick, or rub the adhesive.  °Do not put tape over the adhesive. The adhesive could come off.  °Protect the wound from another injury.  °Protect the wound from sun and tanning beds.  °Only take medicine as told by your doctor.  °Keep all doctor visits as told. °GET HELP RIGHT AWAY IF:  °Your wound is red, puffy (swollen), hot, or tender.  °You get a rash after the glue is put on.  °You have more pain in the wound.  °You have a red streak going away from the wound.  °You have yellowish-white fluid (pus) coming from the wound.  °You have more bleeding.  °You have a fever.  °You have chills and start to shake.  °You notice a bad smell coming from the wound.  °Your wound or adhesive breaks open. °MAKE SURE YOU:  °Understand these instructions.  °Will watch your condition.  °Will get help right away if you are not doing well or get worse. °Document Released: 02/21/2008 Document Revised: 03/04/2013 Document Reviewed: 12/03/2012  °ExitCare® Patient Information ©2015 [employer]. This information is not intended to replace advice given to you by your health care provider.   Make sure you discuss any questions you have with your health care provider.  ° ° °

## 2014-04-13 NOTE — Op Note (Signed)
Operative Note   DATE OF OPERATION: 11.17.2015  LOCATION: [facility]- outpatient  SURGICAL DIVISION: Plastic Surgery  PREOPERATIVE DIAGNOSES:  Sclerosing basal cell carcinoma nasal tip  POSTOPERATIVE DIAGNOSES:  same  PROCEDURE: 1. Excision malignant lesion nasal tip 1.3 cm 2. Layered closure nose 2 cm  SURGEON: Irene Limbo MD MBA  ASSISTANT: none  ANESTHESIA:  General.   EBL: minimal  COMPLICATIONS: None immediate.   INDICATIONS FOR PROCEDURE:  The patient, Gary Reynolds, is a 73 y.o. 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    FINDINGS: Frozen sections with negative margins for basal cell carcinoma.  DESCRIPTION OF PROCEDURE:  The patient's operative site was marked with the patient in the preoperative area. The patient was taken to the operating room. SCDs were placed and IV antibiotics were given. The patient's operative site was prepped and draped in a sterile fashion. A time out was performed and all information was confirmed to be correct.  Local anesthetic infiltrated to perform bilateral infraorbital nerve blocks as well as surrounding lesion. Currette used to debulk lesion and aid with identification of margins. Excision with margins completed and specimen marked for pathology. Perichondrium remained intact over underlying alar cartilages. Frozen section analysis with no basal cell at margins, sebaceous hyperplasia noted. Adjacent skin elevated and primary closure completed with 4-0 vicryl in dermis and interrupted 5-0 plain gut for skin closure. Antibiotic ointment applied.   The patient was allowed to wake from anesthesia, extubated and taken to the recovery room in satisfactory condition.   SPECIMENS: basal cell carcinoma nose  DRAINS: none  Irene Limbo, MD [facility] Plastic & Reconstructive Surgery [phone]

## 2014-04-13 NOTE — Transfer of Care (Signed)
Immediate Anesthesia Transfer of Care Note  Patient: Gary Reynolds  Procedure(s) Performed: Procedure(s): EXCISION OF MALIGNANT LESION OF NOSE  (N/A)  Patient Location: PACU  Anesthesia Type:General  Level of Consciousness: sedated  Airway & Oxygen Therapy: Patient Spontanous Breathing and Patient connected to nasal cannula oxygen  Post-op Assessment: Report given to PACU RN and Post -op Vital signs reviewed and stable  Post vital signs: Reviewed and stable  Complications: No apparent anesthesia complications

## 2014-04-13 NOTE — Anesthesia Postprocedure Evaluation (Signed)
  Anesthesia Post-op Note  Patient: Gary Reynolds  Procedure(s) Performed: Procedure(s): EXCISION OF MALIGNANT LESION OF NOSE  (N/A)  Patient Location: PACU  Anesthesia Type:General  Level of Consciousness: awake and alert   Airway and Oxygen Therapy: Patient Spontanous Breathing  Post-op Pain: none  Post-op Assessment: Post-op Vital signs reviewed  Post-op Vital Signs: Reviewed and stable  Last Vitals:  Filed Vitals:   04/13/14 1115  BP: 133/86  Pulse: 86  Temp: 36.4 C  Resp: 18    Complications: No apparent anesthesia complications

## 2014-04-14 ENCOUNTER — Encounter (HOSPITAL_COMMUNITY): Payer: Self-pay | Admitting: Plastic Surgery

## 2014-04-26 NOTE — H&P (Signed)
  Subjective:    Patient ID: Gary Reynolds is a 73 y.o. male.  Follow-up   2 week post BCC nose excision and primary closure. 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 Pt has hx stroke and is WC bound, Hoyer lift and unable to be cared for at their facility.    Review of Systems     Objective:    Physical Exam  HENT:  Multiple AK Left nasal tip incision with dried scab centrally 5 mm  Cardiovascular: Normal rate and normal heart sounds.  Pulmonary/Chest: Effort normal and breath sounds normal.  Psychiatric: He has a normal mood and affect.       Assessment:      BCC nose, focal recurrent.     Plan:      Plan reexcision in OR- required GA. Counseled will likely be unable to close and will leave open to air to heal secondarily.  Irene Limbo, MD [facility] Plastic & Reconstructive Surgery [phone]

## 2014-05-03 NOTE — H&P (Deleted)
  Subjective:    Patient ID: Gary Reynolds is a 73 y.o. male.  Follow-up   4 week post BCC nose excision and primary closure. 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 Initial shave biopsy with BCC sclerosing. Pt has hx stroke and is WC bound, Hoyer lift and unable to be cared for at [facility].    Review of Systems     Objective:    Physical Exam  HENT:  Multiple AK Left nasal tip incision with dried scab centrally 5 mm  Cardiovascular: Normal rate and normal heart sounds.  Pulmonary/Chest: Effort normal and breath sounds normal.  Psychiatric: He has a normal mood and affect.       Assessment:      BCC nose, focal recurrent.     Plan:      Plan reexcision in OR- required GA. Counseled will likely be unable to close and will leave open to air to heal secondarily.  Irene Limbo, MD [facility] Plastic & Reconstructive Surgery [phone]

## 2014-05-07 NOTE — Progress Notes (Signed)
Reviewed case with Sandy-director-pt is a total care skilled care-w/c bound-lifting equipment needed-we do not have-needs to be done at main or-called surgeons office

## 2014-05-18 ENCOUNTER — Encounter (HOSPITAL_COMMUNITY): Payer: Self-pay | Admitting: *Deleted

## 2014-05-18 MED ORDER — CEFAZOLIN SODIUM-DEXTROSE 2-3 GM-% IV SOLR
2.0000 g | INTRAVENOUS | Status: DC
  Filled 2014-05-18: qty 50

## 2014-05-18 NOTE — Progress Notes (Signed)
Pt is a resident at [facility]. Spoke with Claiborne Billings, RN for pre-op call. She verified pt's allergies, meds, and medical history. She states he does not have a history of dementia or Alzheimer's, but does have repetitive speech. She states that he repeats "my back hurts" a lot. She states he is on routine pain medication and that he does have back pain, but that is one of his repetitive sayings. She also states that he will occasionally shout out curse words, especially with moving him. Pt no longer has a feeding tube. Virgie Dad pre-op instructions which included: NPO after MN tonight, meds to give pt in AM are Carvedilol, Cymbalta, Prilosec, Oxycodone, Xanax - if needed, Duoneb if needed, Instructed that pt does not need to wear lotions, powders or cologne. Gave her Short Stay's number if needed in AM. Also called pt's son, Wilborn Membreno and notified him of time of arrival and surgery start time. He plans to meet pt here in AM.

## 2014-05-19 ENCOUNTER — Encounter (HOSPITAL_COMMUNITY): Payer: Self-pay | Admitting: Surgery

## 2014-05-19 ENCOUNTER — Encounter (HOSPITAL_COMMUNITY)
Admission: RE | Disposition: A | Discharge status: Skilled Nursing Facility | Payer: Self-pay | Source: Ambulatory Visit | Attending: Plastic Surgery

## 2014-05-19 ENCOUNTER — Ambulatory Visit (HOSPITAL_COMMUNITY): Payer: PRIVATE HEALTH INSURANCE | Admitting: Anesthesiology

## 2014-05-19 ENCOUNTER — Ambulatory Visit (HOSPITAL_COMMUNITY)
Admission: RE | Admit: 2014-05-19 | Discharge: 2014-05-19 | Disposition: A | Discharge status: Skilled Nursing Facility | Payer: PRIVATE HEALTH INSURANCE | Source: Ambulatory Visit | Attending: Plastic Surgery | Admitting: Plastic Surgery

## 2014-05-19 DIAGNOSIS — Z8673 Personal history of transient ischemic attack (TIA), and cerebral infarction without residual deficits: Secondary | ICD-10-CM | POA: Diagnosis not present

## 2014-05-19 DIAGNOSIS — C44311 Basal cell carcinoma of skin of nose: Secondary | ICD-10-CM | POA: Diagnosis present

## 2014-05-19 HISTORY — DX: Malignant (primary) neoplasm, unspecified: C80.1

## 2014-05-19 HISTORY — DX: Unspecified urinary incontinence: R32

## 2014-05-19 HISTORY — DX: Dorsalgia, unspecified: M54.9

## 2014-05-19 HISTORY — DX: Other chronic pain: G89.29

## 2014-05-19 HISTORY — DX: Depression, unspecified: F32.A

## 2014-05-19 HISTORY — DX: Pneumonia, unspecified organism: J18.9

## 2014-05-19 HISTORY — DX: Major depressive disorder, single episode, unspecified: F32.9

## 2014-05-19 HISTORY — DX: Constipation, unspecified: K59.00

## 2014-05-19 HISTORY — PX: LESION REMOVAL: SHX5196

## 2014-05-19 LAB — BASIC METABOLIC PANEL
ANION GAP: 8 (ref 5–15)
BUN: 11 mg/dL (ref 6–23)
CO2: 25 mmol/L (ref 19–32)
Calcium: 8.6 mg/dL (ref 8.4–10.5)
Chloride: 103 mEq/L (ref 96–112)
Creatinine, Ser: 0.91 mg/dL (ref 0.50–1.35)
GFR, EST NON AFRICAN AMERICAN: 83 mL/min — AB (ref >90)
GLUCOSE: 96 mg/dL (ref 70–99)
Potassium: 4.1 mmol/L (ref 3.5–5.1)
SODIUM: 136 mmol/L (ref 135–145)

## 2014-05-19 LAB — CBC
HCT: 41.1 % (ref 39.0–52.0)
Hemoglobin: 13.5 g/dL (ref 13.0–17.0)
MCH: 29.9 pg (ref 26.0–34.0)
MCHC: 32.8 g/dL (ref 30.0–36.0)
MCV: 90.9 fL (ref 78.0–100.0)
PLATELETS: 157 10*3/uL (ref 150–400)
RBC: 4.52 MIL/uL (ref 4.22–5.81)
RDW: 13.5 % (ref 11.5–15.5)
WBC: 8.3 10*3/uL (ref 4.0–10.5)

## 2014-05-19 SURGERY — WIDE EXCISION, LESION, UPPER EXTREMITY
Anesthesia: Monitor Anesthesia Care | Site: Nose

## 2014-05-19 MED ORDER — LIDOCAINE HCL (CARDIAC) 20 MG/ML IV SOLN
INTRAVENOUS | Status: AC
  Filled 2014-05-19: qty 5

## 2014-05-19 MED ORDER — FENTANYL CITRATE 0.05 MG/ML IJ SOLN
INTRAMUSCULAR | Status: AC
  Filled 2014-05-19: qty 5

## 2014-05-19 MED ORDER — GLYCOPYRROLATE 0.2 MG/ML IJ SOLN
INTRAMUSCULAR | Status: AC
  Filled 2014-05-19: qty 1

## 2014-05-19 MED ORDER — 0.9 % SODIUM CHLORIDE (POUR BTL) OPTIME
TOPICAL | Status: DC | PRN
  Administered 2014-05-19: 1000 mL

## 2014-05-19 MED ORDER — GLYCOPYRROLATE 0.2 MG/ML IJ SOLN
INTRAMUSCULAR | Status: DC | PRN
  Administered 2014-05-19: 0.2 mg via INTRAVENOUS

## 2014-05-19 MED ORDER — HEMOSTATIC AGENTS (NO CHARGE) OPTIME
TOPICAL | Status: DC | PRN
  Administered 2014-05-19: 1 via TOPICAL

## 2014-05-19 MED ORDER — LIDOCAINE-EPINEPHRINE 1 %-1:100000 IJ SOLN
INTRAMUSCULAR | Status: DC | PRN
  Administered 2014-05-19: 20 mL

## 2014-05-19 MED ORDER — SODIUM CHLORIDE 0.9 % IJ SOLN
INTRAMUSCULAR | Status: AC
  Filled 2014-05-19: qty 10

## 2014-05-19 MED ORDER — LIDOCAINE-EPINEPHRINE 1 %-1:100000 IJ SOLN
INTRAMUSCULAR | Status: AC
  Filled 2014-05-19: qty 1

## 2014-05-19 MED ORDER — PROPOFOL 10 MG/ML IV BOLUS
INTRAVENOUS | Status: DC | PRN
  Administered 2014-05-19 (×2): 20 mg via INTRAVENOUS

## 2014-05-19 MED ORDER — MIDAZOLAM HCL 2 MG/2ML IJ SOLN
INTRAMUSCULAR | Status: AC
  Filled 2014-05-19: qty 2

## 2014-05-19 MED ORDER — OXYCODONE HCL 5 MG PO TABS
ORAL_TABLET | ORAL | Status: AC
  Filled 2014-05-19: qty 1

## 2014-05-19 MED ORDER — EPHEDRINE SULFATE 50 MG/ML IJ SOLN
INTRAMUSCULAR | Status: AC
  Filled 2014-05-19: qty 1

## 2014-05-19 MED ORDER — PROPOFOL 10 MG/ML IV BOLUS
INTRAVENOUS | Status: AC
  Filled 2014-05-19: qty 20

## 2014-05-19 MED ORDER — CEFAZOLIN SODIUM-DEXTROSE 2-3 GM-% IV SOLR
INTRAVENOUS | Status: DC | PRN
  Administered 2014-05-19: 2 g via INTRAVENOUS

## 2014-05-19 MED ORDER — OXYCODONE HCL 5 MG PO TABS
5.0000 mg | ORAL_TABLET | Freq: Once | ORAL | Status: AC
  Administered 2014-05-19: 5 mg via ORAL

## 2014-05-19 MED ORDER — LACTATED RINGERS IV SOLN
INTRAVENOUS | Status: DC | PRN
  Administered 2014-05-19: 08:00:00 via INTRAVENOUS

## 2014-05-19 SURGICAL SUPPLY — 50 items
BALL CTTN LRG ABS STRL LF (GAUZE/BANDAGES/DRESSINGS)
BLADE CLIPPER SURG (BLADE) IMPLANT
CLOSURE WOUND 1/2 X4 (GAUZE/BANDAGES/DRESSINGS)
CLOSURE WOUND 1/4X4 (GAUZE/BANDAGES/DRESSINGS)
CONT SPECI 4OZ STER CLIK (MISCELLANEOUS) ×2 IMPLANT
COTTONBALL LRG STERILE PKG (GAUZE/BANDAGES/DRESSINGS) IMPLANT
DECANTER SPIKE VIAL GLASS SM (MISCELLANEOUS) IMPLANT
DRAPE U-SHAPE 76X120 STRL (DRAPES) ×1 IMPLANT
ELECT COATED BLADE 2.86 ST (ELECTRODE) IMPLANT
ELECT NDL BLADE 2-5/6 (NEEDLE) IMPLANT
ELECT NEEDLE BLADE 2-5/6 (NEEDLE) ×3 IMPLANT
ELECT REM PT RETURN 9FT ADLT (ELECTROSURGICAL) ×3
ELECTRODE REM PT RTRN 9FT ADLT (ELECTROSURGICAL) ×1 IMPLANT
GAUZE SPONGE 2X2 8PLY STRL LF (GAUZE/BANDAGES/DRESSINGS) IMPLANT
GAUZE SPONGE 4X4 16PLY XRAY LF (GAUZE/BANDAGES/DRESSINGS) ×2 IMPLANT
GAUZE XEROFORM 1X8 LF (GAUZE/BANDAGES/DRESSINGS) IMPLANT
GAUZE XEROFORM 5X9 LF (GAUZE/BANDAGES/DRESSINGS) IMPLANT
GLOVE BIO SURGEON STRL SZ 6 (GLOVE) ×3 IMPLANT
GLOVE BIO SURGEON STRL SZ 6.5 (GLOVE) IMPLANT
GLOVE BIO SURGEONS STRL SZ 6.5 (GLOVE)
GLOVE BIOGEL PI IND STRL 7.0 (GLOVE) IMPLANT
GLOVE BIOGEL PI INDICATOR 7.0 (GLOVE) ×2
GOWN STRL REUS W/ TWL LRG LVL3 (GOWN DISPOSABLE) ×2 IMPLANT
GOWN STRL REUS W/TWL LRG LVL3 (GOWN DISPOSABLE) ×6
KIT BASIN OR (CUSTOM PROCEDURE TRAY) ×2 IMPLANT
LIQUID BAND (GAUZE/BANDAGES/DRESSINGS) IMPLANT
NDL PRECISIONGLIDE 27X1.5 (NEEDLE) ×1 IMPLANT
NEEDLE 27GAX1/2IN MONOJET (NEEDLE) ×2 IMPLANT
NEEDLE PRECISIONGLIDE 27X1.5 (NEEDLE) IMPLANT
PACK BASIN DAY SURGERY FS (CUSTOM PROCEDURE TRAY) ×3 IMPLANT
PACK GENERAL/GYN (CUSTOM PROCEDURE TRAY) ×2 IMPLANT
PENCIL BUTTON HOLSTER BLD 10FT (ELECTRODE) ×1 IMPLANT
SHEET MEDIUM DRAPE 40X70 STRL (DRAPES) IMPLANT
SLEEVE SCD COMPRESS KNEE MED (MISCELLANEOUS) ×1 IMPLANT
SPONGE GAUZE 2X2 8PLY STER LF (GAUZE/BANDAGES/DRESSINGS)
SPONGE GAUZE 2X2 8PLY STRL LF (GAUZE/BANDAGES/DRESSINGS) IMPLANT
SPONGE GAUZE 2X2 STER 10/PKG (GAUZE/BANDAGES/DRESSINGS) ×2
SPONGE SURGIFOAM ABS GEL 12-7 (HEMOSTASIS) ×2 IMPLANT
STRIP CLOSURE SKIN 1/2X4 (GAUZE/BANDAGES/DRESSINGS) IMPLANT
STRIP CLOSURE SKIN 1/4X4 (GAUZE/BANDAGES/DRESSINGS) IMPLANT
SUT PLAIN 5 0 P 3 18 (SUTURE) IMPLANT
SUT PROLENE 5 0 P 3 (SUTURE) IMPLANT
SUT PROLENE 5 0 PS 2 (SUTURE) IMPLANT
SUT PROLENE 6 0 P 1 18 (SUTURE) IMPLANT
SUT SILK 4 0 P 3 (SUTURE) ×2 IMPLANT
SYR BULB 3OZ (MISCELLANEOUS) IMPLANT
SYR CONTROL 10ML LL (SYRINGE) ×2 IMPLANT
TAPE CLOTH SOFT 2X10 (GAUZE/BANDAGES/DRESSINGS) ×2 IMPLANT
TOWEL OR 17X24 6PK STRL BLUE (TOWEL DISPOSABLE) ×3 IMPLANT
TRAY DSU PREP LF (CUSTOM PROCEDURE TRAY) ×3 IMPLANT

## 2014-05-19 NOTE — Discharge Instructions (Signed)

## 2014-05-19 NOTE — Transfer of Care (Signed)
Immediate Anesthesia Transfer of Care Note  Patient: Gary Reynolds  Procedure(s) Performed: Procedure(s): EXCISION OF MALIGNANT LESION OF THE NOSE (N/A)  Patient Location: PACU  Anesthesia Type:MAC  Level of Consciousness: awake, sedated and patient cooperative  Airway & Oxygen Therapy: Patient Spontanous Breathing and Patient connected to nasal cannula oxygen  Post-op Assessment: Report given to PACU RN and Post -op Vital signs reviewed and stable  Post vital signs: Reviewed and stable  Complications: No apparent anesthesia complications

## 2014-05-19 NOTE — Progress Notes (Signed)
Nurse called [facility] and spoke with Claiborne Billings, [place]. Claiborne Billings stated that patient was given Coreg, Cymbalta, Omeprazole, and Oxycodone 10mg  this morning at 0530. She stated that patient did not have any Xanax this morning but received it last night. Buddy (patients son) at stretcher side. Patient is alert and oriented to person, and place. Patient keeps his eyes closed but easily responds when spoken too. Will continue to monitor.

## 2014-05-19 NOTE — Progress Notes (Signed)
Dr. Iran Planas paged to request that orders be signed.

## 2014-05-19 NOTE — Anesthesia Preprocedure Evaluation (Addendum)
Anesthesia Evaluation  Patient identified by MRN, date of birth, ID bandGeneral Assessment Comment:Patien very sleepy from oxycodone received at nsg. Home.  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    GI/Hepatic Neg liver ROS, GERD-  ,  Endo/Other  negative endocrine ROS  Renal/GU negative Renal ROS     Musculoskeletal   Abdominal Normal abdominal exam  (+)   Peds  Hematology negative hematology ROS (+)   Anesthesia Other Findings   Reproductive/Obstetrics                            Anesthesia Physical Anesthesia Plan  ASA: III  Anesthesia Plan: MAC   Post-op Pain Management:    Induction: Intravenous  Airway Management Planned: Nasal Cannula and Natural Airway  Additional Equipment: None  Intra-op Plan:   Post-operative Plan:   Informed Consent: I have reviewed the patients History and Physical, chart, labs and discussed the procedure including the risks, benefits and alternatives for the proposed anesthesia with the patient or authorized representative who has indicated his/her understanding and acceptance.     Plan Discussed with: CRNA, Anesthesiologist and Surgeon  Anesthesia Plan Comments:        Anesthesia Quick Evaluation

## 2014-05-19 NOTE — Interval H&P Note (Signed)
History and Physical Interval Note:  05/19/2014 8:11 AM  Gary Reynolds  has presented today for surgery, with the diagnosis of basal cell carcinoma of the nose  The various methods of treatment have been discussed with the patient and family. After consideration of risks, benefits and other options for treatment, the patient has consented to  Procedure(s): EXCISION OF MALIGNANT LESION OF THE NOSE (N/A) as a surgical intervention .  The patient's history has been reviewed, patient examined, no change in status, stable for surgery.  I have reviewed the patient's chart and labs.  Questions were answered to the patient's satisfaction.     Tavari Loadholt

## 2014-05-19 NOTE — Op Note (Signed)
Operative Note   DATE OF OPERATION: 12.23.2015  LOCATION: [facility] OR-outpatient  SURGICAL DIVISION: Plastic Surgery  PREOPERATIVE DIAGNOSES:  Basal cell carcinoma nose, infiltrative and sclerosing.  POSTOPERATIVE DIAGNOSES:  same  PROCEDURE:  Excision malignant lesion nose 2 cm  SURGEON: Irene Limbo MD MBA  ASSISTANT: none  ANESTHESIA:  MAC.   EBL: minimal  COMPLICATIONS: None.   INDICATIONS FOR PROCEDURE:  The patient, Gary Reynolds, is a 73 y.o. male born on February 06, 1941, is here for re excision biopsy proven BCC nose with final pathology with involved margins. Plan to leave wound open to heal secondarily given co morbidities.   FINDINGS: Reexcision of skin and deep margins included resection portion of genu lower lateral cartilage on left.  DESCRIPTION OF PROCEDURE:  The patient's operative site was marked with the patient in the preoperative area. The patient was taken to the operating room.  IV antibiotics were given. The patient's operative site was prepped and draped in a sterile fashion. A time out was performed and all information was confirmed to be correct.  Local anesthetic infiltrated and curette used to debulk lesion and aid with identification borders. Sharp excision of lesion with borders completed. Specimen marked for pathology. Wound dressed with gelfoam and dry dressing.   The patient was allowed to wake from anesthesia and taken to the recovery room in satisfactory condition.   SPECIMENS: Reexcision basal cell nose  DRAINS: none  Irene Limbo, MD [facility] Plastic & Reconstructive Surgery [phone]

## 2014-05-20 ENCOUNTER — Encounter (HOSPITAL_COMMUNITY): Payer: Self-pay | Admitting: Plastic Surgery

## 2014-05-20 NOTE — Anesthesia Postprocedure Evaluation (Signed)
  Anesthesia Post-op Note  Patient: Gary Reynolds  Procedure(s) Performed: Procedure(s): EXCISION OF MALIGNANT LESION OF THE NOSE (N/A)  Patient Location: PACU  Anesthesia Type:MAC  Level of Consciousness: awake  Airway and Oxygen Therapy: Patient Spontanous Breathing  Post-op Pain: none  Post-op Assessment: Post-op Vital signs reviewed, Patient's Cardiovascular Status Stable, Respiratory Function Stable, Patent Airway, No signs of Nausea or vomiting and Pain level controlled  Post-op Vital Signs: Reviewed and stable  Last Vitals:  Filed Vitals:   05/19/14 1045  BP:   Pulse: 49  Temp: 36.3 C  Resp: 19    Complications: No apparent anesthesia complications

## 2015-03-08 IMAGING — CR DG ABD PORTABLE 1V
2 series · 2 of 2 positions shown · non-contrast
Comparison: DG HIP BILATERAL W/PELVIS dated 10/09/2013; DG ABD 1
VIEW dated 10/09/2013; DG ABD PORTABLE 1V dated 02/14/2007

CLINICAL DATA: Gastrostomy tube placement. Barium positioning for
localization prior to procedure.

EXAM:
PORTABLE ABDOMEN - 1 VIEW

[AP (1 of 2)]
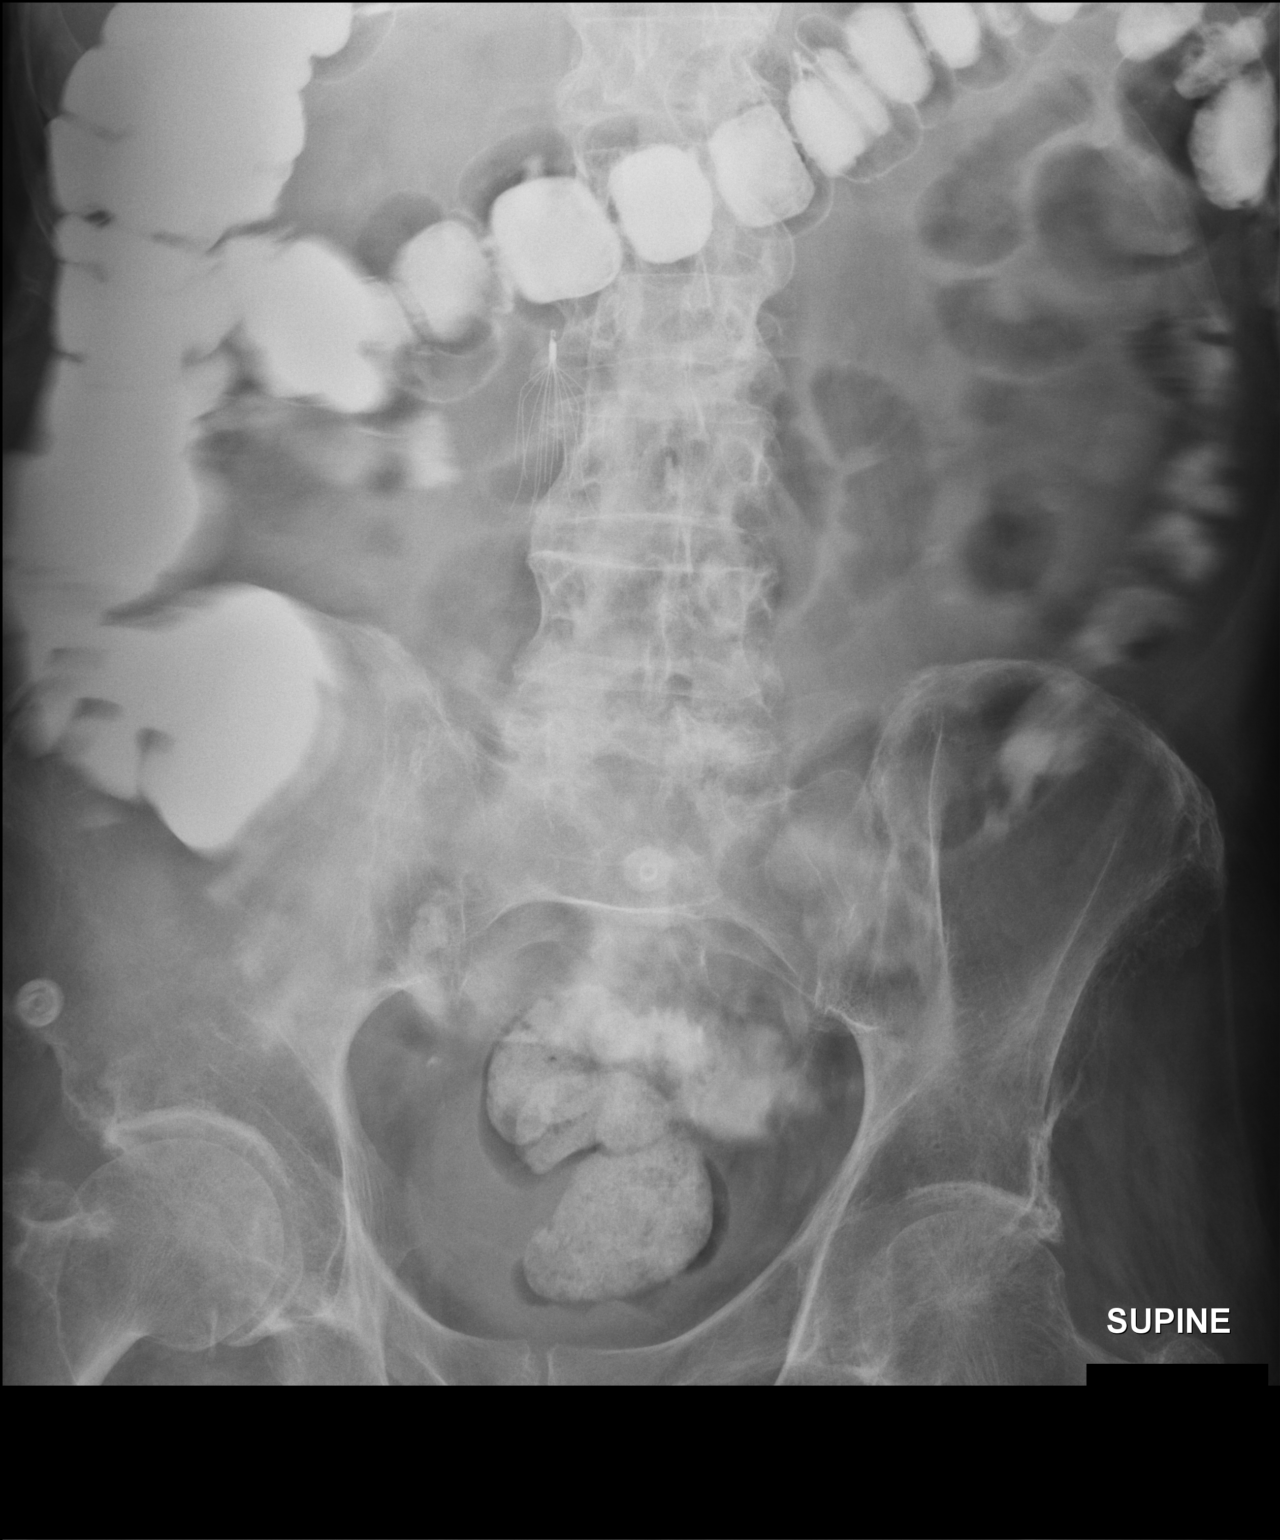

[AP (2 of 2)]
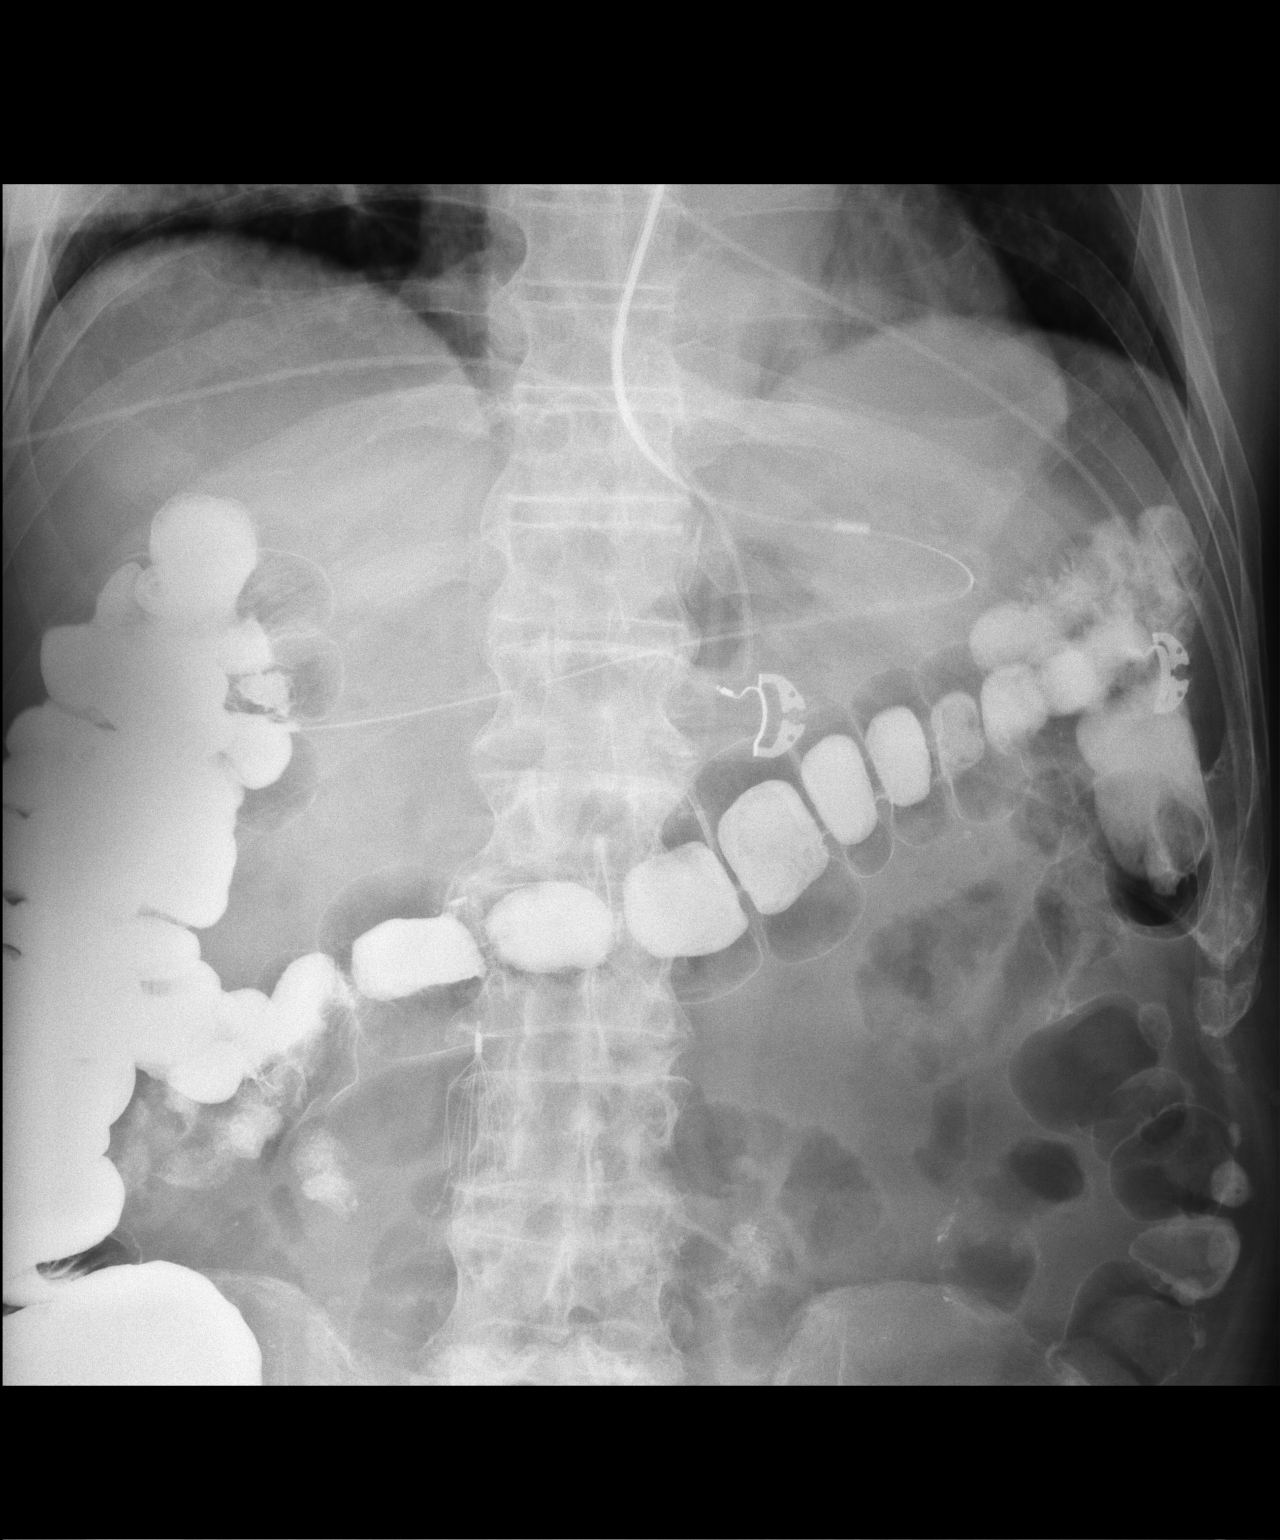

[2 of 2 positions shown; findings below may reference images not displayed]

FINDINGS: Barium is present in the colon, including the transverse colon and
splenic flexure.

IVC filter noted.  No abnormal dilated bowel.
IMPRESSION: 1. Barium outlines the colon.

## 2015-03-08 IMAGING — CT CT HIP*R* W/O CM
2 of 4 series · 8 of 46 positions shown, 9 images · non-contrast
Comparison: Pelvis radiography 10/09/2013

CLINICAL DATA: Right hip subcapital fracture, old versus new.

EXAM:
CT OF THE RIGHT HIP WITHOUT CONTRAST
TECHNIQUE: Multidetector CT imaging was performed according to the standard
protocol. Multiplanar CT image reconstructions were also generated.

[Series 305: coronal · coronal · 0.34mm/px · 3 of 88 slices shown]
[im 18/88  soft-tissue]
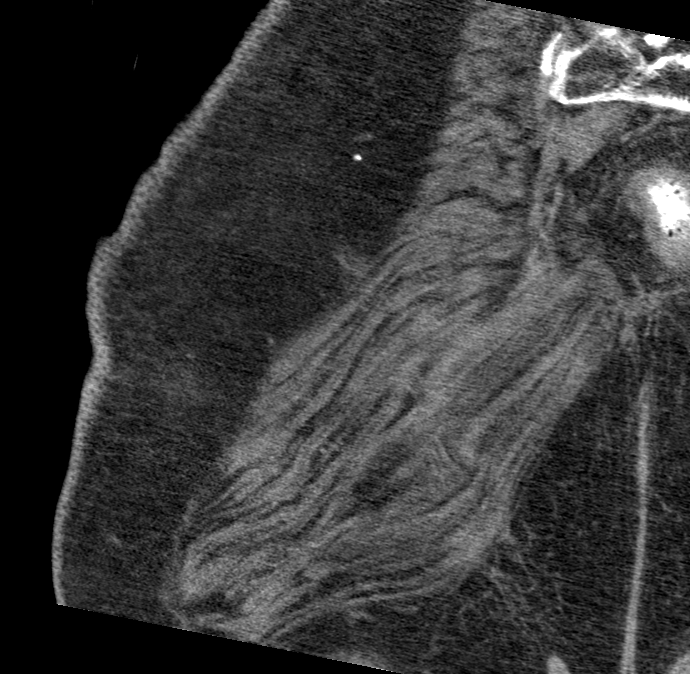
[im 35/88  soft-tissue]
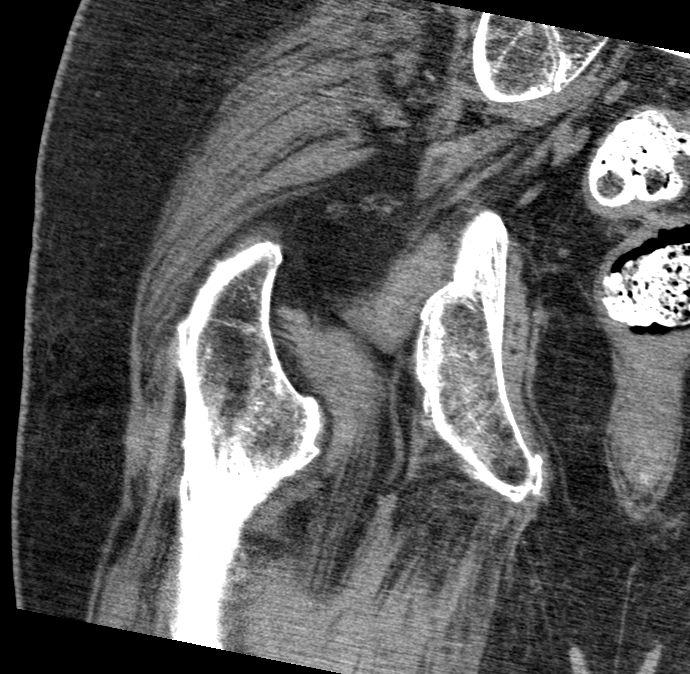
[im 53/88  soft-tissue]
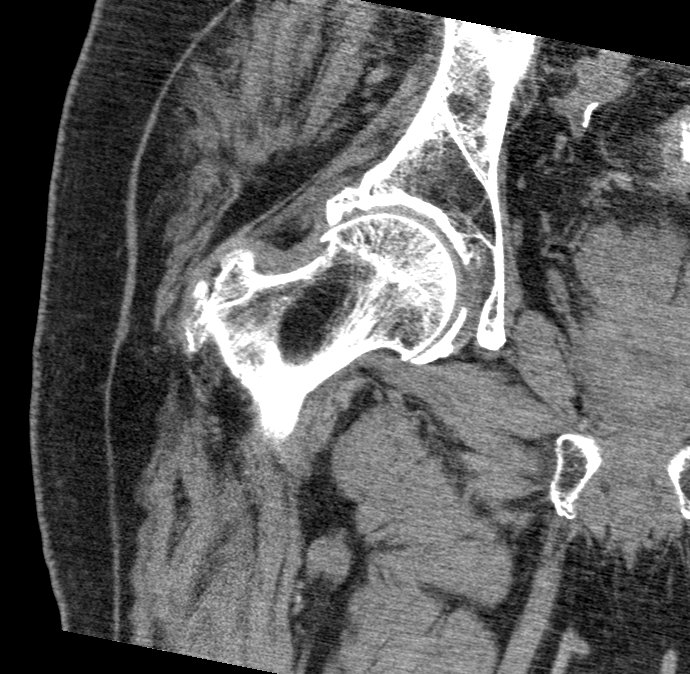

[Series 306: soft tissue · axial · 0.31mm/px · z∈[-107,+71]mm · 5 of 116 slices shown, 6 images]
[im 15/116  soft-tissue]
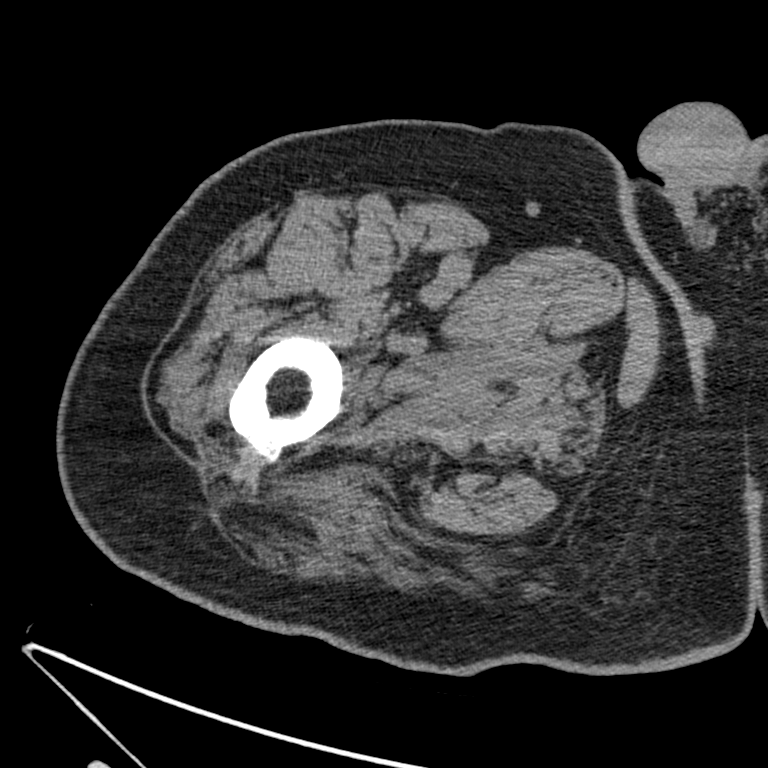
[im 15/116  bone]
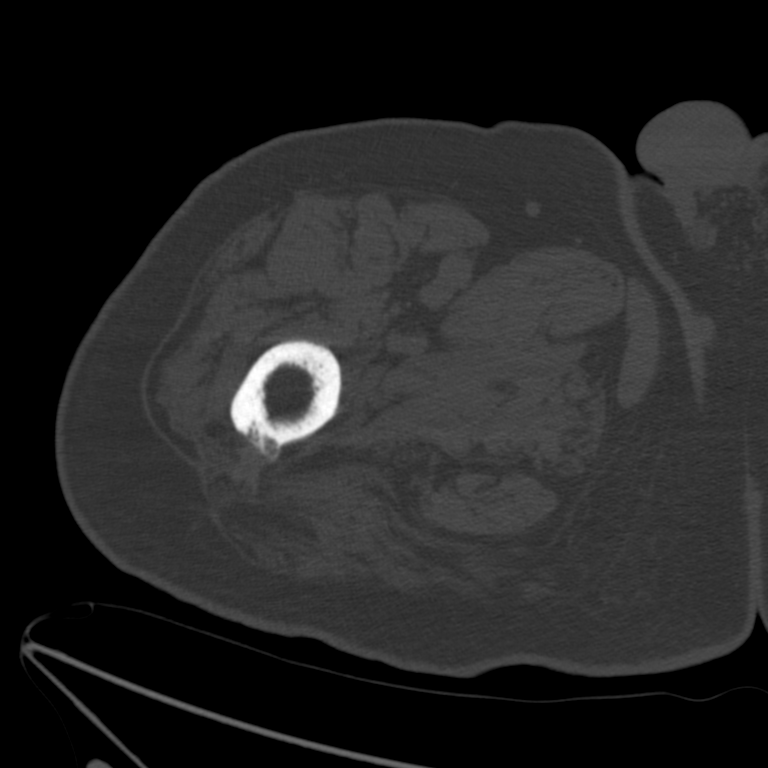
[im 38/116  soft-tissue]
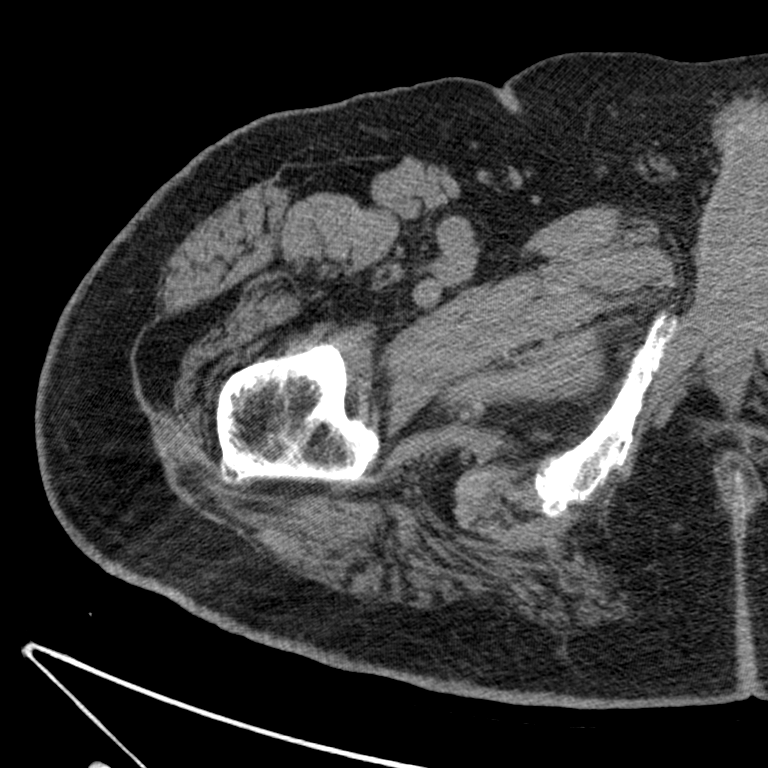
[im 60/116  soft-tissue]
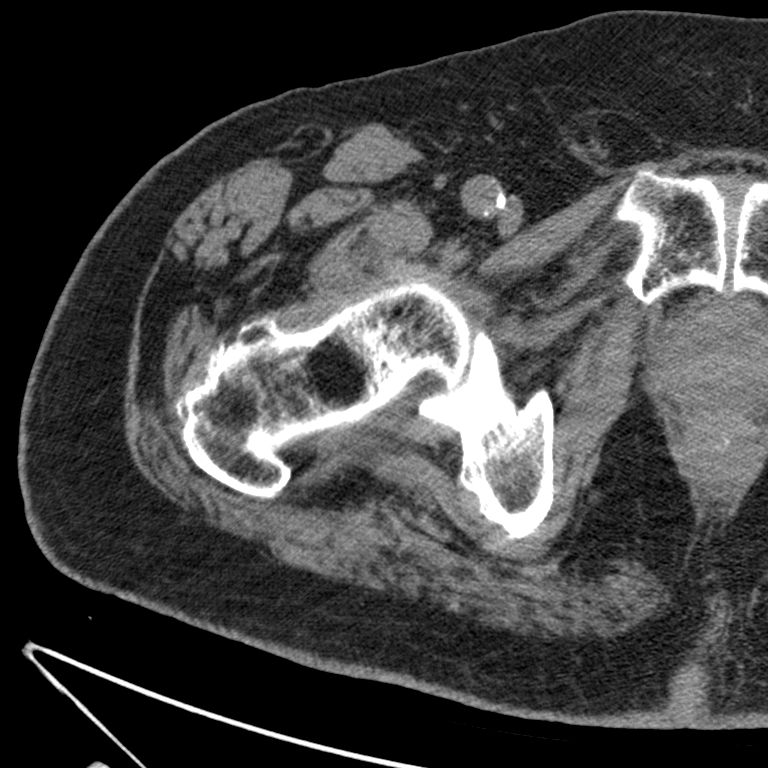
[im 82/116  soft-tissue]
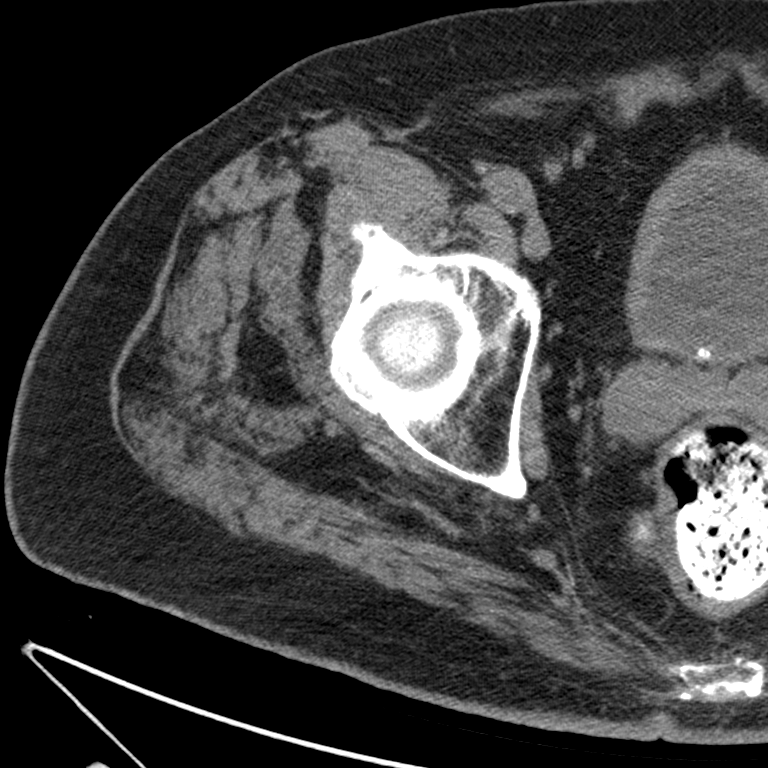
[im 104/116  soft-tissue]
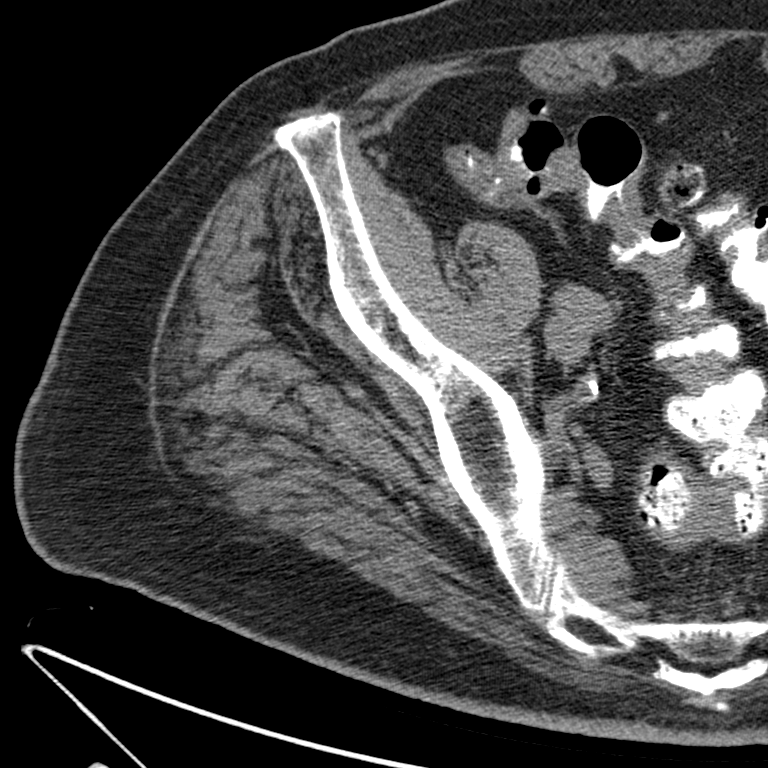

[8 of 46 positions shown; findings below may reference images not displayed]

FINDINGS: No evidence of right hip fracture. The imaged portions of the right
bony pelvis are intact. The subcapital findings described on
previous examination represent degenerative marginal osteophytes
rather than subcapital fracture. Medullary sclerotic focus in the
upper right femoral neck blends with the cortex, most consistent
with bone island.

There is circumferential bladder wall thickening, likely from
prostate enlargement. Multiple small bladder stones, measuring up to
7 mm. Colonic diverticulosis.
IMPRESSION: 1. Negative for right hip fracture.
2. Prostate enlargement with chronic bladder outlet obstruction and
bladder calculi.

## 2018-02-25 DEATH — deceased
